# Patient Record
Sex: Female | Born: 1980 | Race: White | Hispanic: No | Marital: Married | Smoking: Never smoker
Health system: Southern US, Community
[De-identification: ages and names within clinical notes are randomized; demographics above are authoritative.]

## PROBLEM LIST (undated history)

## (undated) ENCOUNTER — Inpatient Hospital Stay (HOSPITAL_COMMUNITY): Payer: Self-pay

## (undated) DIAGNOSIS — M1A9XX1 Chronic gout, unspecified, with tophus (tophi): Secondary | ICD-10-CM

## (undated) DIAGNOSIS — E282 Polycystic ovarian syndrome: Secondary | ICD-10-CM

## (undated) DIAGNOSIS — I10 Essential (primary) hypertension: Secondary | ICD-10-CM

## (undated) DIAGNOSIS — E039 Hypothyroidism, unspecified: Secondary | ICD-10-CM

## (undated) DIAGNOSIS — M109 Gout, unspecified: Secondary | ICD-10-CM

## (undated) DIAGNOSIS — N200 Calculus of kidney: Secondary | ICD-10-CM

## (undated) DIAGNOSIS — O039 Complete or unspecified spontaneous abortion without complication: Secondary | ICD-10-CM

## (undated) DIAGNOSIS — M436 Torticollis: Secondary | ICD-10-CM

## (undated) DIAGNOSIS — R51 Headache: Secondary | ICD-10-CM

## (undated) HISTORY — DX: Torticollis: M43.6

## (undated) HISTORY — DX: Complete or unspecified spontaneous abortion without complication: O03.9

## (undated) HISTORY — DX: Chronic gout, unspecified, with tophus (tophi): M1A.9XX1

## (undated) HISTORY — DX: Gout, unspecified: M10.9

## (undated) HISTORY — PX: OTHER SURGICAL HISTORY: SHX169

## (undated) HISTORY — PX: LITHOTRIPSY: SUR834

---
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
## 2003-03-13 ENCOUNTER — Other Ambulatory Visit: Admission: RE | Admit: 2003-03-13 | Discharge: 2003-03-13 | Payer: Self-pay | Admitting: *Deleted

## 2003-03-31 ENCOUNTER — Encounter: Admission: RE | Admit: 2003-03-31 | Discharge: 2003-06-29 | Payer: Self-pay | Admitting: Family Medicine

## 2003-08-26 ENCOUNTER — Encounter: Admission: RE | Admit: 2003-08-26 | Discharge: 2003-11-24 | Payer: Self-pay | Admitting: Family Medicine

## 2003-12-02 ENCOUNTER — Encounter: Admission: RE | Admit: 2003-12-02 | Discharge: 2004-03-01 | Payer: Self-pay | Admitting: Family Medicine

## 2004-08-07 HISTORY — PX: WISDOM TOOTH EXTRACTION: SHX21

## 2011-05-30 ENCOUNTER — Ambulatory Visit: Payer: Self-pay | Admitting: Family Medicine

## 2011-06-02 LAB — BASIC METABOLIC PANEL
BUN: 11 mg/dL (ref 4–21)
Creatinine: 0.8 mg/dL (ref <=1.1)
Potassium: 4.7 mmol/L (ref 3.4–5.3)

## 2011-06-02 LAB — LIPID PANEL: Cholesterol: 243 mg/dL — AB (ref 0–200)

## 2011-06-02 LAB — TSH: TSH: 10.59 u[IU]/mL — AB (ref <=5.90)

## 2011-06-02 LAB — HEPATIC FUNCTION PANEL
AST: 26 U/L (ref 13–35)
Bilirubin, Total: 0.6 mg/dL

## 2011-06-02 LAB — CBC AND DIFFERENTIAL
Hemoglobin: 16.1 g/dL — AB (ref 12.0–16.0)
WBC: 9 10^3/mL

## 2011-06-08 ENCOUNTER — Encounter: Payer: Self-pay | Admitting: Family Medicine

## 2011-06-08 ENCOUNTER — Ambulatory Visit (INDEPENDENT_AMBULATORY_CARE_PROVIDER_SITE_OTHER): Payer: PRIVATE HEALTH INSURANCE | Admitting: Family Medicine

## 2011-06-08 DIAGNOSIS — R946 Abnormal results of thyroid function studies: Secondary | ICD-10-CM

## 2011-06-08 DIAGNOSIS — M109 Gout, unspecified: Secondary | ICD-10-CM

## 2011-06-08 DIAGNOSIS — IMO0002 Reserved for concepts with insufficient information to code with codable children: Secondary | ICD-10-CM

## 2011-06-08 DIAGNOSIS — Z23 Encounter for immunization: Secondary | ICD-10-CM

## 2011-06-08 MED ORDER — LEVOTHYROXINE SODIUM 50 MCG PO CAPS: 50.0000 ug | ORAL_CAPSULE | Freq: Every day | ORAL | Status: DC

## 2011-06-08 MED ORDER — BENAZEPRIL HCL 20 MG PO TABS: 20.0000 mg | ORAL_TABLET | Freq: Every day | ORAL | Status: DC

## 2011-06-08 MED ORDER — CEPHALEXIN 500 MG PO CAPS: 500.0000 mg | ORAL_CAPSULE | Freq: Three times a day (TID) | ORAL | Status: AC

## 2011-06-08 NOTE — Patient Instructions (Signed)
Eat a very low salt diet 1.5 Gram Low Sodium Diet A 1.5 gram sodium diet restricts the amount of sodium in the diet to no more than 1.5 g or 1500 mg daily. The American Heart Association recommends Americans over the age of 58 to consume no more than 1500 mg of sodium each day to reduce the risk of developing high blood pressure. Research also shows that limiting sodium may reduce heart attack and stroke risk. Many foods contain sodium for flavor and sometimes as a preservative. When the amount of sodium in a diet needs to be low, it is important to know what to look for when choosing foods and drinks. The following includes some information and guidelines to help make it easier for you to adapt to a low sodium diet. QUICK TIPS  Do not add salt to food.     Avoid convenience items and fast food.     Choose unsalted snack foods.     Buy lower sodium products, often labeled as "lower sodium" or "no salt added."     Check food labels to learn how much sodium is in 1 serving.     When eating at a restaurant, ask that your food be prepared with less salt or none, if possible.  READING FOOD LABELS FOR SODIUM INFORMATION The nutrition facts label is a good place to find how much sodium is in foods. Look for products with no more than 400 mg of sodium per serving. Remember that 1.5 g = 1500 mg. The food label may also list foods as:  Sodium-free: Less than 5 mg in a serving.     Very low sodium: 35 mg or less in a serving.     Low-sodium: 140 mg or less in a serving.     Light in sodium: 50% less sodium in a serving. For example, if a food that usually has 300 mg of sodium is changed to become light in sodium, it will have 150 mg of sodium.     Reduced sodium: 25% less sodium in a serving. For example, if a food that usually has 400 mg of sodium is changed to reduced sodium, it will have 300 mg of sodium.  CHOOSING FOODS Grains  Avoid: Salted crackers and snack items. Some cereals, including  instant hot cereals. Bread stuffing and biscuit mixes. Seasoned rice or pasta mixes.     Choose: Unsalted snack items. Low-sodium cereals, oats, puffed wheat and rice, shredded wheat. English muffins and bread. Pasta.  Meats  Avoid: Salted, canned, smoked, spiced, pickled meats, including fish and poultry. Bacon, ham, sausage, cold cuts, hot dogs, anchovies.     Choose: Low-sodium canned tuna and salmon. Fresh or frozen meat, poultry, and fish.  Dairy  Avoid: Processed cheese and spreads. Cottage cheese. Buttermilk and condensed milk. Regular cheese.     Choose: Milk. Low-sodium cottage cheese. Yogurt. Sour cream. Low-sodium cheese.  Fruits and Vegetables  Avoid: Regular canned vegetables. Regular canned tomato sauce and paste. Frozen vegetables in sauces. Olives. Rosita Fire. Relishes. Sauerkraut.     Choose: Low-sodium canned vegetables. Low-sodium tomato sauce and paste. Frozen or fresh vegetables. Fresh and frozen fruit.  Condiments  Avoid: Canned and packaged gravies. Worcestershire sauce. Tartar sauce. Barbecue sauce. Soy sauce. Steak sauce. Ketchup. Onion, garlic, and table salt. Meat flavorings and tenderizers.     Choose: Fresh and dried herbs and spices. Low-sodium varieties of mustard and ketchup. Lemon juice. Tabasco sauce. Horseradish.  SAMPLE 1.5 GRAM SODIUM MEAL PLAN Breakfast /  Sodium (mg)  1 cup low-fat milk / 143 mg     1 whole-wheat English muffin / 240 mg     1 tbs heart-healthy margarine / 153 mg     1 hard-boiled egg / 139 mg     1 small orange / 0 mg  Lunch / Sodium (mg)  1 cup raw carrots / 76 mg     2 tbs no salt added peanut butter / 5 mg     2 slices whole-wheat bread / 270 mg     1 tbs jelly / 6 mg      cup red grapes / 2 mg  Dinner / Sodium (mg)  1 cup whole-wheat pasta / 2 mg     1 cup low-sodium tomato sauce / 73 mg     3 oz lean ground beef / 57 mg     1 small side salad (1 cup raw spinach leaves,  cup cucumber,  cup yellow bell  pepper) with 1 tsp olive oil and 1 tsp red wine vinegar / 25 mg  Snack / Sodium (mg)  1 container low-fat vanilla yogurt / 107 mg     3 graham cracker squares / 127 mg  Nutrient Analysis  Calories: 1745     Protein: 75 g     Carbohydrate: 237 g     Fat: 57 g     Sodium: 1425 mg  Document Released: 07/24/2005 Document Revised: 04/05/2011 Document Reviewed: 10/25/2009 [facility] Patient Information 2012 [place], [place].

## 2011-06-08 NOTE — Progress Notes (Signed)
Subjective:    Patient ID: Brenda Sharp, female    DOB: 07-15-1981, 30 y.o.   MRN: [id number]  HPI  In the ED was having abdominal Pain. Was dx with kidney stones.  BP was also very high there. Ended up with a lithotrypsy but they wouldn't do it because of the high blood pressure.  Saw Dr. Zachery Dauer in [place] and started amlodipine 5mg  for 7 days.  Tolerating well. Had full blood workup. When they did this was told hypothyroid and was told to tell new MD to start mediction. No sig weight gain.  + hair loss over the last year.  TSH was 10.  C/O of a lot of fatigue.   Gout -she also has a history of gout. She currently follows with orthopedist. She is currently on prednisone that she uses as needed. She says she has tried several different anti-inflammatories including colchicine in the past and that they did not work for her. She also tried allopurinol because it made her flares worse if she discontinued it. She is not on anything prophylactically. She does have a family history of gout. She gets between 6-8 flares per year.  Left hand, middle finger-she says it has been red swollen and occasionally will notice some drainage or pus every couple of days. It is now caused the nail to look abnormal along the medial border. She would like me to look at today. She denies any significant pain or tenderness.  Review of Systems  Constitutional: Negative for fever, diaphoresis and unexpected weight change.  HENT: Negative for hearing loss, rhinorrhea and tinnitus.        Hay fever allergies.   Eyes: Negative for visual disturbance.  Respiratory: Positive for cough. Negative for wheezing.   Cardiovascular: Negative for chest pain and palpitations.  Gastrointestinal: Negative for nausea, vomiting, diarrhea and blood in stool.  Genitourinary: Negative for vaginal bleeding, vaginal discharge and difficulty urinating.  Musculoskeletal: Negative for myalgias and arthralgias.  Skin: Negative for rash.    Neurological: Positive for headaches.  Hematological: Negative for adenopathy. Does not bruise/bleed easily.  Psychiatric/Behavioral: Positive for dysphoric mood. Negative for sleep disturbance. The patient is not nervous/anxious.        BP 150/106  Pulse 79  Wt 258 lb (117.028 kg)    Allergies  Allergen Reactions  . Penicillins     History reviewed. No pertinent past medical history.  Past Surgical History  Procedure Date  . Tonsillectomy 1990  . Wisdom tooth extraction 2006    History   Social History  . Marital Status: Single    Spouse Name: N/A    Number of Children: 0  . 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      [employer]   Social History Main Topics  . Smoking status: Never Smoker   . Smokeless tobacco: Not on file  . Alcohol Use: No  . Drug Use: No  . Sexually Active: Not on file   Other Topics Concern  . Not on file   Social History Narrative   4 caffeine per day.  No regular exercise.  She lives with her father Dorene Sorrow    Family History  Problem Relation Age of Onset  . Lung cancer Paternal Grandfather     smoker  . Heart attack Paternal Grandmother   . Heart attack Paternal Grandfather   . Diabetes    . Heart attack Maternal Grandfather   . Hypertension Father  Ms. Doreene Adas does not currently have medications on file.  Objective:   Physical Exam  Constitutional: She is oriented to person, place, and time. She appears well-developed and well-nourished.       Obese.   HENT:  Head: Normocephalic and atraumatic.  Eyes: Conjunctivae are normal. Pupils are equal, round, and reactive to light.  Neck: Normal range of motion. Neck supple. No thyromegaly present.  Cardiovascular: Normal rate, regular rhythm and normal heart sounds.   Pulmonary/Chest: Effort normal and breath sounds normal.  Musculoskeletal: She exhibits no edema.  Lymphadenopathy:    She has no cervical adenopathy.  Neurological:  She is alert and oriented to person, place, and time.  Skin: Skin is warm and dry.  Psychiatric: She has a normal mood and affect. Her behavior is normal.   Left hand, middle finger-she does have some atrophic changes to the medial nail border. There is no significant erythema or induration of the tissue around the nail. I am unable to express any discharge or pus.       Assessment & Plan:  HTN-not well controlled on amlodipine by itself. I will add an ACE inhibitor to see if we can get better control. If she does well in the hospital in the amlodipine we can do the combination medication which is generic. I hesitate to increase her amlodipine because of increased potential for swelling on the 10 mg dose. Follow up in 3-4 weeks.  Hypothyroid - we will start her with Synthroid 50 mcg daily. Recheck in 6-8 weeks. Hopefully this will help with her weight, fatigue, and recent hair loss.  Gout-we had a long conversation about the need for prophylaxis to prevent damage to her joints over the long-term. I discussed the allopurinol and Uloric which are the 2 prophylactic medications will both exacerbate her symptoms initially for usually about that for 6 months. Typically add colchicine once a day to help keep this under control and at that point she can come off of the colchicine. At this point in time I would like your blood pressure and thyroid under control and then we can concentrate on her gout when I see her back at her followup. Certainly being on steroids frequently has other long-term consequences.  Possible resolving paronychia-I don't see anything on exam today that would be amenable to I&D. I will have the structures for Keflex to see if we can get this to improve. If drainage recurs then please call the office on Monday with the finger.

## 2011-06-12 ENCOUNTER — Encounter: Payer: Self-pay | Admitting: Family Medicine

## 2011-06-12 LAB — URIC ACID: Uric Acid: 11.7

## 2011-06-27 ENCOUNTER — Encounter: Payer: Self-pay | Admitting: Family Medicine

## 2011-06-27 LAB — URIC ACID: Uric Acid: 10.3

## 2011-06-28 ENCOUNTER — Ambulatory Visit: Payer: PRIVATE HEALTH INSURANCE | Admitting: Family Medicine

## 2011-07-03 ENCOUNTER — Encounter: Payer: Self-pay | Admitting: Family Medicine

## 2011-07-03 ENCOUNTER — Ambulatory Visit (INDEPENDENT_AMBULATORY_CARE_PROVIDER_SITE_OTHER): Payer: PRIVATE HEALTH INSURANCE | Admitting: Family Medicine

## 2011-07-03 ENCOUNTER — Other Ambulatory Visit: Payer: Self-pay | Admitting: *Deleted

## 2011-07-03 VITALS — BP 121/72 | HR 109 | Wt 256.0 lb

## 2011-07-03 DIAGNOSIS — E785 Hyperlipidemia, unspecified: Secondary | ICD-10-CM

## 2011-07-03 DIAGNOSIS — E039 Hypothyroidism, unspecified: Secondary | ICD-10-CM

## 2011-07-03 DIAGNOSIS — M109 Gout, unspecified: Secondary | ICD-10-CM

## 2011-07-03 DIAGNOSIS — I10 Essential (primary) hypertension: Secondary | ICD-10-CM

## 2011-07-03 MED ORDER — COLCHICINE 0.6 MG PO TABS: 0.6000 mg | ORAL_TABLET | Freq: Every day | ORAL | Status: DC

## 2011-07-03 MED ORDER — FEBUXOSTAT 40 MG PO TABS: 40.0000 mg | ORAL_TABLET | Freq: Every day | ORAL | Status: DC

## 2011-07-03 NOTE — Progress Notes (Signed)
  Subjective:    Patient ID: Brenda Sharp, female    DOB: May 09, 1981, 30 y.o.   MRN: [id number]  Hypertension This is a chronic problem. The current episode started more than 1 year ago. The problem is unchanged. The problem is controlled. Pertinent negatives include no chest pain or shortness of breath. There are no associated agents to hypertension. Past treatments include ACE inhibitors and calcium channel blockers. The current treatment provides significant improvement. Compliance problems include exercise.    Gout- She took allopurinol for over a year and unfortunately they were not able to significantly decrease her uric acid level. She has never tried Uloric. She currently uses prednisone for her flares. She said colchicine tends to upset her stomach.  Also here to follow up and discuss her lab work which was drawn by her previous PCP.  Hypothyroidism-she can try to take her thyroid medication more consistently than that she is still very inconsistent. She knows that for [place] to check a blood level she needs to be more consistent to see if the testing is accurate.   Review of Systems  Respiratory: Negative for shortness of breath.   Cardiovascular: Negative for chest pain.       Objective:   Physical Exam  Constitutional: She is oriented to person, place, and time. She appears well-developed and well-nourished.  HENT:  Head: Normocephalic and atraumatic.  Neck: Neck supple. No thyromegaly present.  Cardiovascular: Normal rate, regular rhythm and normal heart sounds.   Pulmonary/Chest: Effort normal and breath sounds normal.  Lymphadenopathy:    She has no cervical adenopathy.  Neurological: She is alert and oriented to person, place, and time.  Skin: Skin is warm and dry.  Psychiatric: Her behavior is normal.          Assessment & Plan:  Hypertension-well controlled on current regimen of amlodipine and benazepril. Continue current regimen. Followup in 6  months.  Gout-after reviewing her lab work from from her prior PCP her uric acid level was over 11. She took allopurinol for over a year and unfortunately they were not able to significantly decrease her uric acid level. She has never tried Uloric. She currently uses prednisone for her flares. She said colchicine tends to upset her stomach. We discussed a trial of Uloric. I would like to start with a 40 mg tablet for the first 2 months and then try to increase to 80 mg based on her starting level of uric acid. I also discussed the importance of taking a medication such as cortisone once a day for the first 6 months to help reduce the possibility for flares. I explained that hopefully with once a day dosing this should not upset her stomach as a typical repeat dosing for an acute flare. I discussed the importance of avoiding steroids as much as possible as this certainly is only going to cause weight gain. As the long-term effects. She is only 29 and really needs to get her gout under good control. I will see her back in 3 months. At that point time she will have been on a milligram dose for one month.  Hyperlipidemia-whenever her lab results. I would like her to really focus in on low fat diet and regular exercise and we will repeat her cholesterol in 3 months.  Hypothyroidism-she has been trying to be more consistent with her thyroid medication. I will give her a lab slip to recheck her TSH sometime this month.

## 2011-07-03 NOTE — Patient Instructions (Signed)
Work on diet and exercise for your cholesterol. Recheck in 3 mo.

## 2011-07-04 MED ORDER — FEBUXOSTAT 40 MG PO TABS: 40.0000 mg | ORAL_TABLET | Freq: Every day | ORAL | Status: DC

## 2011-07-04 MED ORDER — COLCHICINE 0.6 MG PO TABS: 0.6000 mg | ORAL_TABLET | Freq: Every day | ORAL | Status: DC

## 2011-07-04 NOTE — Telephone Encounter (Signed)
Addended by: Garrit Marrow P on: 07/04/2011 04:24 PM   Modules accepted: Orders  

## 2011-07-19 ENCOUNTER — Ambulatory Visit (INDEPENDENT_AMBULATORY_CARE_PROVIDER_SITE_OTHER): Payer: PRIVATE HEALTH INSURANCE | Admitting: Family Medicine

## 2011-07-19 ENCOUNTER — Encounter: Payer: Self-pay | Admitting: Family Medicine

## 2011-07-19 VITALS — BP 154/118 | HR 93 | Temp 98.2°F | Ht 63.0 in | Wt 255.0 lb

## 2011-07-19 DIAGNOSIS — I10 Essential (primary) hypertension: Secondary | ICD-10-CM

## 2011-07-19 DIAGNOSIS — E039 Hypothyroidism, unspecified: Secondary | ICD-10-CM

## 2011-07-19 DIAGNOSIS — J329 Chronic sinusitis, unspecified: Secondary | ICD-10-CM

## 2011-07-19 MED ORDER — BENAZEPRIL HCL 20 MG PO TABS: 20.0000 mg | ORAL_TABLET | Freq: Every day | ORAL | Status: DC

## 2011-07-19 MED ORDER — AZITHROMYCIN 250 MG PO TABS: ORAL_TABLET | ORAL | Status: AC

## 2011-07-19 MED ORDER — AMLODIPINE BESYLATE 5 MG PO TABS: 5.0000 mg | ORAL_TABLET | Freq: Every day | ORAL | Status: DC

## 2011-07-19 NOTE — Patient Instructions (Signed)
Stay hydrated Call if your not better in a week.

## 2011-07-19 NOTE — Progress Notes (Signed)
  Subjective:    Patient ID: Brenda Sharp, female    DOB: 05-16-1981, 30 y.o.   MRN: [id number]  HPI Cough x 2 weeks.  More dry. + nasal congestion for 2 weeks. No facil pain or pressure. No fever.  No GI sxs, maybe some nausea. Mild ST.  Left ear pain for about 2-3 days. Ears have been popping. Taking mucinex for 2 weeks. Not sure if helping.  Also using cough syrup. Not actively on prednisone.   HTN - elevated today fom cough syrup. No problems with the medication, no SE and takes them daily  Needs refills.   Hypothyroid - Felt better initially once we started her medication but says now feels tired again. Says she does take it about an hour before breakfast on an empty stomach and says takes it daily.   Review of Systems     Objective:   Physical Exam  Constitutional: She is oriented to person, place, and time. She appears well-developed and well-nourished.  HENT:  Head: Normocephalic and atraumatic.  Right Ear: External ear normal.  Left Ear: External ear normal.  Nose: Nose normal.  Mouth/Throat: Oropharynx is clear and moist.       TMs and canals are clear.   Eyes: Conjunctivae and EOM are normal. Pupils are equal, round, and reactive to light.  Neck: Neck supple. No thyromegaly present.  Cardiovascular: Normal rate, regular rhythm and normal heart sounds.   Pulmonary/Chest: Effort normal and breath sounds normal. She has no wheezes.  Lymphadenopathy:    She has no cervical adenopathy.  Neurological: She is alert and oriented to person, place, and time.  Skin: Skin is warm and dry.  Psychiatric: She has a normal mood and affect.          Assessment & Plan:  Sinusitis -Will tx with ABX.  Stay hydrated. Call if your not better in a week. Stop cough syrup since elevated her BP.   HTN- Elevated today. She is on cough med. Recheck n Feb at f/u. Check BMP today  Hypothyroid - Recheck TSh today to make sure back into normal range.   Due for f/u gout in Feb. Will recheck  her urinc acid at that time.

## 2011-07-20 ENCOUNTER — Other Ambulatory Visit: Payer: Self-pay | Admitting: Family Medicine

## 2011-07-20 LAB — BASIC METABOLIC PANEL
CO2: 26 mEq/L (ref 19–32)
Chloride: 104 mEq/L (ref 96–112)
Potassium: 4.6 mEq/L (ref 3.5–5.3)
Sodium: 140 mEq/L (ref 135–145)

## 2011-07-20 MED ORDER — LEVOTHYROXINE SODIUM 75 MCG PO CAPS: 75.0000 ug | ORAL_CAPSULE | ORAL | Status: DC

## 2011-10-03 ENCOUNTER — Encounter: Payer: Self-pay | Admitting: Physician Assistant

## 2011-10-03 ENCOUNTER — Ambulatory Visit (INDEPENDENT_AMBULATORY_CARE_PROVIDER_SITE_OTHER): Payer: PRIVATE HEALTH INSURANCE | Admitting: Physician Assistant

## 2011-10-03 ENCOUNTER — Ambulatory Visit: Payer: PRIVATE HEALTH INSURANCE | Admitting: Family Medicine

## 2011-10-03 VITALS — BP 180/121 | HR 97 | Temp 98.1°F | Ht 63.0 in | Wt 256.0 lb

## 2011-10-03 DIAGNOSIS — I10 Essential (primary) hypertension: Secondary | ICD-10-CM

## 2011-10-03 DIAGNOSIS — H6692 Otitis media, unspecified, left ear: Secondary | ICD-10-CM

## 2011-10-03 DIAGNOSIS — H6690 Otitis media, unspecified, unspecified ear: Secondary | ICD-10-CM

## 2011-10-03 MED ORDER — CEFDINIR 300 MG PO CAPS: 300.0000 mg | ORAL_CAPSULE | Freq: Two times a day (BID) | ORAL | Status: DC

## 2011-10-03 NOTE — Patient Instructions (Signed)
Start omnicef twice a day for 10 days. STOP all decongestants and do not anymore. For sore throat you can do salt water gargles.   Otitis Media, Adult A middle ear infection is an infection in the space behind the eardrum. The medical name for this is "otitis media." It may happen after a common cold. It is caused by a germ that starts growing in that space. You may feel swollen glands in your neck on the side of the ear infection. HOME CARE INSTRUCTIONS   Take your medicine as directed until it is gone, even if you feel better after the first few days.   Only take over-the-counter or prescription medicines for pain, discomfort, or fever as directed by your caregiver.   Occasional use of a nasal decongestant a couple times per day may help with discomfort and help the eustachian tube to drain better.  Follow up with your caregiver in 10 to 14 days or as directed, to be certain that the infection has cleared. Not keeping the appointment could result in a chronic or permanent injury, pain, hearing loss and disability. If there is any problem keeping the appointment, you must call back to this facility for assistance. SEEK IMMEDIATE MEDICAL CARE IF:   You are not getting better in 2 to 3 days.   You have pain that is not controlled with medication.   You feel worse instead of better.   You cannot use the medication as directed.   You develop swelling, redness or pain around the ear or stiffness in your neck.  MAKE SURE YOU:   Understand these instructions.   Will watch your condition.   Will get help right away if you are not doing well or get worse.  Document Released: 04/28/2004 Document Revised: 04/05/2011 Document Reviewed: 02/28/2008 [facility] Patient Information 2012 [place], [place].

## 2011-10-03 NOTE — Progress Notes (Signed)
  Subjective:    Patient ID: Brenda Sharp, female    DOB: [birth year], 31 y.o.   MRN: [id number]  HPI Patient presents with 2 weeks of a cough with no production. She has had sore throat off and on. Both of her ears have been hurting but left is worse than right. Denies fever, SOB, wheezing or chills. Taken Robtussin and Advil and they have not helped.   She reports her blood pressure is elevated every time she takes decongestants. She denies chest pains or palpitations.   Review of Systems     Objective:   Physical Exam  Constitutional: She is oriented to person, place, and time. She appears well-developed and well-nourished.  HENT:  Head: Normocephalic and atraumatic.  Right Ear: External ear normal.  Left Ear: External ear normal.  Nose: Nose normal.  Mouth/Throat: Oropharynx is clear and moist.       Right TM normal. Left TM is dull and erythematous with purulent blood in external canal noted. No perforation seen.  Eyes: Conjunctivae are normal.  Neck:       Bilateral anterior cervical adenopathy with tenderness to palpitations.   Cardiovascular: Normal rate, regular rhythm and normal heart sounds.   Pulmonary/Chest: Effort normal and breath sounds normal. She has no wheezes.  Lymphadenopathy:    She has cervical adenopathy.  Neurological: She is alert and oriented to person, place, and time.  Psychiatric: She has a normal mood and affect.          Assessment & Plan:  HTN- Patient reports that her blood pressure does this every time she takes decongestants. She has appointment with Dr. Linford Arnold at the beginning of march if still elevated informed patient that we will have to make some changes with medications. Patient was told not to take decongesants at all ever.  Left otitis media- Allergy to PCN with rash. Gave Omnicef. Patient is to stop medication and call office with any adverse reaction. Follow up if not improving in 1 week.

## 2011-10-11 ENCOUNTER — Ambulatory Visit (INDEPENDENT_AMBULATORY_CARE_PROVIDER_SITE_OTHER): Payer: PRIVATE HEALTH INSURANCE | Admitting: Family Medicine

## 2011-10-11 ENCOUNTER — Encounter: Payer: Self-pay | Admitting: Family Medicine

## 2011-10-11 VITALS — BP 134/98 | HR 93 | Ht 63.0 in | Wt 253.0 lb

## 2011-10-11 DIAGNOSIS — I10 Essential (primary) hypertension: Secondary | ICD-10-CM

## 2011-10-11 DIAGNOSIS — R7309 Other abnormal glucose: Secondary | ICD-10-CM

## 2011-10-11 DIAGNOSIS — E8881 Metabolic syndrome: Secondary | ICD-10-CM

## 2011-10-11 DIAGNOSIS — E669 Obesity, unspecified: Secondary | ICD-10-CM | POA: Insufficient documentation

## 2011-10-11 DIAGNOSIS — E1165 Type 2 diabetes mellitus with hyperglycemia: Secondary | ICD-10-CM | POA: Insufficient documentation

## 2011-10-11 DIAGNOSIS — M109 Gout, unspecified: Secondary | ICD-10-CM

## 2011-10-11 LAB — URIC ACID: Uric Acid, Serum: 9.3 mg/dL — ABNORMAL HIGH (ref 2.4–7.0)

## 2011-10-11 LAB — POCT GLYCOSYLATED HEMOGLOBIN (HGB A1C): Hemoglobin A1C: 6.2

## 2011-10-11 MED ORDER — FEBUXOSTAT 80 MG PO TABS: 80.0000 mg | ORAL_TABLET | Freq: Every day | ORAL | Status: DC

## 2011-10-11 MED ORDER — METOPROLOL SUCCINATE ER 25 MG PO TB24: 25.0000 mg | ORAL_TABLET | Freq: Every day | ORAL | Status: DC

## 2011-10-11 NOTE — Progress Notes (Signed)
  Subjective:    Patient ID: Charlyn Minerva, female    DOB: 12-20-1980, 31 y.o.   MRN: [id number]  HPI Gout - Still having to use her prednisone once a week to breakthrough pain with her gout. Taking her Uloric 40mg  daily and her colchicine daily.    HTN- Much improved. No CP or SOB.  No SE of BP meds.  No Aleve or IBU.   She also recently had an ear infection on the left. She said she completed her antibiotics last week but still feeling like the ear is stopped and occasional discomfort. No more fever. No clear drainage. To let me check it to make sure the infection has cleared up.  Also here to follow up abnormal glucose on her fasting labs. She denies any increased thirst or urination. No vision changes.  Review of Systems     Objective:   Physical Exam  Constitutional: She is oriented to person, place, and time. She appears well-developed and well-nourished.  HENT:  Head: Normocephalic and atraumatic.  Left Ear: External ear normal.  Neck: Neck supple. No thyromegaly present.  Cardiovascular: Normal rate, regular rhythm and normal heart sounds.   Pulmonary/Chest: Effort normal and breath sounds normal.  Musculoskeletal:       She is still very tender over the right great toe joint. Mildly erythematous.    Lymphadenopathy:    She has no cervical adenopathy.  Neurological: She is alert and oriented to person, place, and time.  Skin: Skin is warm and dry.  Psychiatric: She has a normal mood and affect. Her behavior is normal.          Assessment & Plan:  Gout-I will increase her Uloric 80 mg. I discussed with her that we really need to get her completely off the steroids. These can be very dangerous long-term and can certainly contribute to her obesity. She can continue the colchicine once a day. Followup in 3-4 months. We will also check a uric acid level today to make sure that she is at goal. Next  Hypertension-much better control today. Her diastolic is still not quite at  goal. I will add metoprolol 25 mg extended release once a day. I would hesitate on increasing her amlodipine to 10 to chair he has problems with swelling in her feet. Recheck blood pressure in 3-4 months.  Eustachian tube dysfunction-her ear infection appears to be completely cleared she still having problems with pressure and is unable to pop the eardrum. We discussed using pressure to try and get it to pop and also adding a nasal steroid spray. I gave her a sample of Zetonna to try for 2 weeks to see if this helps. If she starts running fever or feels her symptoms are getting worse and has come back and have the ear reexamined.  Insulin resistance-we discussed her new diagnosis. We reviewed to decrease her concentrated sweets as well as looking at her portion sizes on her hydrate intake. We also discussed the importance of getting off of the prednisone which increases blood sugars. Also encouraged regular exercise and weight loss. I gave her a handout and reviewed with her plate should look like when she sits down to eat lunch and dinner. It also had some conversions and portion sizes for carbohydrates. Followup in 3-4 months.-

## 2011-10-11 NOTE — Patient Instructions (Signed)
Zetonna- 1 spray in each nostril daily. Let me know if ear is not better in 2 weeks.

## 2011-10-12 ENCOUNTER — Other Ambulatory Visit: Payer: Self-pay | Admitting: *Deleted

## 2011-10-12 MED ORDER — COLCHICINE 0.6 MG PO TABS: 0.6000 mg | ORAL_TABLET | Freq: Every day | ORAL | Status: DC

## 2011-10-17 ENCOUNTER — Other Ambulatory Visit: Payer: Self-pay | Admitting: Family Medicine

## 2011-10-20 ENCOUNTER — Ambulatory Visit (INDEPENDENT_AMBULATORY_CARE_PROVIDER_SITE_OTHER): Payer: PRIVATE HEALTH INSURANCE | Admitting: Family Medicine

## 2011-10-20 ENCOUNTER — Encounter: Payer: Self-pay | Admitting: Family Medicine

## 2011-10-20 VITALS — BP 131/84 | HR 69 | Temp 98.4°F | Ht 63.0 in | Wt 253.0 lb

## 2011-10-20 DIAGNOSIS — J40 Bronchitis, not specified as acute or chronic: Secondary | ICD-10-CM

## 2011-10-20 DIAGNOSIS — J029 Acute pharyngitis, unspecified: Secondary | ICD-10-CM

## 2011-10-20 MED ORDER — HYDROCODONE-HOMATROPINE 5-1.5 MG/5ML PO SYRP: 5.0000 mL | ORAL_SOLUTION | Freq: Every evening | ORAL | Status: AC | PRN

## 2011-10-20 MED ORDER — AZITHROMYCIN 250 MG PO TABS: ORAL_TABLET | ORAL | Status: AC

## 2011-10-20 NOTE — Progress Notes (Signed)
  Subjective:    Patient ID: Brenda Sharp, female    DOB: November 20, 1980, 31 y.o.   MRN: [id number]  HPI Cough and ST x 1 week. Has had ear pain for several weeks. Started the nasal steroid a bout 10 days ago and says her ears are better but getting a burnin sensation on the right side of the throat. Not a lot of post nasal drip. No wheezing.  Occ SOB with the cough.   Review of Systems     Objective:   Physical Exam  Constitutional: She is oriented to person, place, and time. She appears well-developed and well-nourished.  HENT:  Head: Normocephalic and atraumatic.  Right Ear: External ear normal.  Left Ear: External ear normal.  Nose: Nose normal.  Mouth/Throat: Oropharynx is clear and moist.       TMs and canals are clear.  Left TM looks dull. Some dried blood in the canal.   Eyes: Conjunctivae and EOM are normal. Pupils are equal, round, and reactive to light.  Neck: Neck supple. No thyromegaly present.  Cardiovascular: Normal rate, regular rhythm and normal heart sounds.   Pulmonary/Chest: Effort normal and breath sounds normal. She has no wheezes.  Lymphadenopathy:    She has no cervical adenopathy.  Neurological: She is alert and oriented to person, place, and time.  Skin: Skin is warm and dry.  Psychiatric: She has a normal mood and affect.          Assessment & Plan:  Bronchitis- Trial of ABX, tought still could be viral but has had throat sxs for several weeks.  Also consider cough may be ACEi related. Symptomatic care. Rapid strep is neg.

## 2011-10-20 NOTE — Patient Instructions (Addendum)

## 2011-10-26 ENCOUNTER — Ambulatory Visit (INDEPENDENT_AMBULATORY_CARE_PROVIDER_SITE_OTHER): Payer: PRIVATE HEALTH INSURANCE | Admitting: Family Medicine

## 2011-10-26 ENCOUNTER — Encounter: Payer: Self-pay | Admitting: Family Medicine

## 2011-10-26 VITALS — BP 142/96 | HR 80 | Ht 63.0 in | Wt 256.0 lb

## 2011-10-26 DIAGNOSIS — I10 Essential (primary) hypertension: Secondary | ICD-10-CM

## 2011-10-26 DIAGNOSIS — E039 Hypothyroidism, unspecified: Secondary | ICD-10-CM

## 2011-10-26 DIAGNOSIS — E785 Hyperlipidemia, unspecified: Secondary | ICD-10-CM

## 2011-10-26 DIAGNOSIS — R7309 Other abnormal glucose: Secondary | ICD-10-CM

## 2011-10-26 MED ORDER — LEVOTHYROXINE SODIUM 75 MCG PO CAPS: 75.0000 ug | ORAL_CAPSULE | ORAL | Status: DC

## 2011-10-26 MED ORDER — BENAZEPRIL HCL 40 MG PO TABS: 40.0000 mg | ORAL_TABLET | Freq: Every day | ORAL | Status: DC

## 2011-10-26 NOTE — Progress Notes (Signed)
  Subjective:    Patient ID: Brenda Sharp, female    DOB: 1981-06-27, 31 y.o.   MRN: [id number]  HPI  HTN - has been feeling really dizzy and tired (hard to get out of bed) since starting the Toprol 25 XL. No CP or SOB. Taking her meds regularly.   Hypothyroid - She denies any problems except for some recent fatigue. TSH in October was 10 and we send a new rx for synthroid to 75mg .She evidently never got the new rx.   Review of Systems     Objective:   Physical Exam  Constitutional: She is oriented to person, place, and time. She appears well-developed and well-nourished.  HENT:  Head: Normocephalic and atraumatic.  Cardiovascular: Normal rate, regular rhythm and normal heart sounds.   Pulmonary/Chest: Effort normal and breath sounds normal.  Musculoskeletal: She exhibits no edema.  Neurological: She is alert and oriented to person, place, and time.  Skin: Skin is warm and dry.  Psychiatric: She has a normal mood and affect. Her behavior is normal.          Assessment & Plan:  HTN- will hold metoprolol and inc benazepril to 40mg . Continue amlodipine. If not well controlled and feels better off the metoprolol then consider adding HCTZ.  F/U in 3-4 weeks.   Hypothyroidism-will send a prescription for 75 mcg of Levothroid. After couple weeks she can go to lab and recheck her level. She was given a lab slip today.  Hyperlipidemia-she really wanted to work on diet and exercise and then recheck or levels. They're still elevated then consider starting a statin at that time.

## 2011-11-22 ENCOUNTER — Ambulatory Visit: Payer: PRIVATE HEALTH INSURANCE | Admitting: Family Medicine

## 2011-12-01 ENCOUNTER — Ambulatory Visit (INDEPENDENT_AMBULATORY_CARE_PROVIDER_SITE_OTHER): Payer: PRIVATE HEALTH INSURANCE | Admitting: Family Medicine

## 2011-12-01 ENCOUNTER — Encounter: Payer: Self-pay | Admitting: Family Medicine

## 2011-12-01 VITALS — BP 130/88 | HR 70 | Ht 63.0 in | Wt 256.0 lb

## 2011-12-01 DIAGNOSIS — I10 Essential (primary) hypertension: Secondary | ICD-10-CM

## 2011-12-01 DIAGNOSIS — R7301 Impaired fasting glucose: Secondary | ICD-10-CM

## 2011-12-01 DIAGNOSIS — R42 Dizziness and giddiness: Secondary | ICD-10-CM

## 2011-12-01 DIAGNOSIS — E785 Hyperlipidemia, unspecified: Secondary | ICD-10-CM | POA: Insufficient documentation

## 2011-12-01 MED ORDER — NEBIVOLOL HCL 5 MG PO TABS: 5.0000 mg | ORAL_TABLET | Freq: Every day | ORAL | Status: DC

## 2011-12-01 NOTE — Progress Notes (Signed)
  Subjective:    Patient ID: Brenda Sharp, female    DOB: 12-28-1980, 31 y.o.   MRN: [id number]  HPI Dizzy when wakes up in AM and when lays down at night. Startede after starting the metoprolol 25mg .  No CP or SOB.  No NSAIDs. Eating a low salt diet. She says she is trying to eat very healthy. Otherwise she takes all of her blood pressure pills at night this is very consistent with her medications. She has not been taking any anti-inflammatories.     Review of Systems     Objective:   Physical Exam  Constitutional: She is oriented to person, place, and time. She appears well-developed and well-nourished.  HENT:  Head: Normocephalic and atraumatic.  Cardiovascular: Normal rate, regular rhythm and normal heart sounds.   Pulmonary/Chest: Effort normal and breath sounds normal.  Neurological: She is alert and oriented to person, place, and time.  Skin: Skin is warm and dry.  Psychiatric: She has a normal mood and affect. Her behavior is normal.          Assessment & Plan:  HTN-Controlled but dizzy since started the metoprolol. I gave her samples of the skull at 5 mg to try in place of the metoprolol. I also asked her to move her ACE inhibitor the morning that way she can separate out some the blood pressure pills so they don't peak at the same time. Would like to see if this helped control her dizziness in the next couple weeks. She can give me a call. If it works well then we will send a prescription to her pharmacy. I did give her a coupon for a free 30 day supply.  IFG - Check A1C at f/u in 3 months.  She is doing well and trying to watch what she eats.   Hyper;lipidemia - due to recheck level. Uncontrolled. Given labslip today.

## 2011-12-01 NOTE — Patient Instructions (Signed)
Please call me in 2-3 weeks and let me know if Bystolic is working without side effects. If so we can call in a prescription.  Move benazepril to AM and take the amlodipine and Bystolic at night.

## 2011-12-05 LAB — LIPID PANEL: LDL Cholesterol: 125 mg/dL — ABNORMAL HIGH (ref 0–99)

## 2011-12-25 ENCOUNTER — Ambulatory Visit (INDEPENDENT_AMBULATORY_CARE_PROVIDER_SITE_OTHER): Payer: PRIVATE HEALTH INSURANCE | Admitting: Family Medicine

## 2011-12-25 ENCOUNTER — Encounter: Payer: Self-pay | Admitting: Family Medicine

## 2011-12-25 VITALS — BP 112/80 | HR 101 | Temp 98.3°F | Ht 63.0 in | Wt 250.0 lb

## 2011-12-25 DIAGNOSIS — R110 Nausea: Secondary | ICD-10-CM

## 2011-12-25 DIAGNOSIS — R509 Fever, unspecified: Secondary | ICD-10-CM

## 2011-12-25 DIAGNOSIS — E860 Dehydration: Secondary | ICD-10-CM

## 2011-12-25 NOTE — Progress Notes (Signed)
  Subjective:    Patient ID: Brenda Sharp, female    DOB: 26-Jul-1981, 31 y.o.   MRN: [id number]  HPI Started with dull HA 5 days ago. Dec appetite.  Next day felt nauseated, then blacked out and fell over the kitchen sink. Says took a few seconds.  Thought maybe it was a migraine that started. Says has had constant HA in her posterior neck and bilateral forehead. Has had dec appetite.   Not eating Feels really nauseated.  No vomiting.  No diarrhea.  Friday night it was 103.  Then Sat night had chills and had 101 temp.  Took Advill and that helped.  No other runny nose or cough.  Maybe some phlegm in the throat.  No fever last night. No known sick contacts.  No CP or palps when passed no urinary sxs.     Review of Systems     Objective:   Physical Exam  Constitutional: She is oriented to person, place, and time. She appears well-developed and well-nourished.  HENT:  Head: Normocephalic and atraumatic.  Right Ear: External ear normal.  Left Ear: External ear normal.  Nose: Nose normal.  Mouth/Throat: Oropharynx is clear and moist.       TMs and canals are clear.   Eyes: Conjunctivae and EOM are normal. Pupils are equal, round, and reactive to light.  Neck: Neck supple. No thyromegaly present.  Cardiovascular: Normal rate, regular rhythm and normal heart sounds.   Pulmonary/Chest: Effort normal and breath sounds normal. She has no wheezes.  Abdominal: Soft. Bowel sounds are normal. She exhibits no distension and no mass. There is no tenderness. There is no rebound and no guarding.  Lymphadenopathy:    She has no cervical adenopathy.  Neurological: She is alert and oriented to person, place, and time.  Skin: Skin is warm and dry.  Psychiatric: She has a normal mood and affect.          Assessment & Plan:  LIkely viral illness - will check CBC.  Fever has defervesced.  She appears to be dehydrated on exam today and encouraged her to push fluids aggressively. Given handout on  bronchitis well. She can advance as tolerated. Also check a CMP to make sure that her electrolytes are not abnormal.  Syncope- Likley hypoglycemic episode from not eating all day.  May be the cause of the HA.  She has not had any more syncopal episodes.

## 2011-12-25 NOTE — Patient Instructions (Signed)
Call me if have another episodes of syncope or get chest pain or heart racings.   B.R.A.T. Diet Your doctor has recommended the B.R.A.T. diet for you or your child until the condition improves. This is often used to help control diarrhea and vomiting symptoms. If you or your child can tolerate clear liquids, you may have:  Bananas.   Rice.   Applesauce.   Toast (and other simple starches such as crackers, potatoes, noodles).  Be sure to avoid dairy products, meats, and fatty foods until symptoms are better. Fruit juices such as apple, grape, and prune juice can make diarrhea worse. Avoid these. Continue this diet for 2 days or as instructed by your caregiver. Document Released: 07/24/2005 Document Revised: 07/13/2011 Document Reviewed: 01/10/2007 [facility] Patient Information 2012 [place], [place].

## 2011-12-26 ENCOUNTER — Other Ambulatory Visit: Payer: Self-pay | Admitting: Family Medicine

## 2011-12-26 DIAGNOSIS — R748 Abnormal levels of other serum enzymes: Secondary | ICD-10-CM

## 2011-12-26 DIAGNOSIS — R110 Nausea: Secondary | ICD-10-CM

## 2011-12-26 LAB — COMPLETE METABOLIC PANEL WITH GFR
Albumin: 4 g/dL (ref 3.5–5.2)
Alkaline Phosphatase: 141 U/L — ABNORMAL HIGH (ref 39–117)
CO2: 27 mEq/L (ref 19–32)
Chloride: 99 mEq/L (ref 96–112)
GFR, Est Non African American: 85 mL/min
Glucose, Bld: 105 mg/dL — ABNORMAL HIGH (ref 70–99)
Potassium: 4.3 mEq/L (ref 3.5–5.3)
Sodium: 136 mEq/L (ref 135–145)
Total Protein: 6.6 g/dL (ref 6.0–8.3)

## 2011-12-26 LAB — CBC WITH DIFFERENTIAL/PLATELET
Basophils Absolute: 0.2 10*3/uL — ABNORMAL HIGH (ref 0.0–0.1)
HCT: 45.5 % (ref 36.0–46.0)
Hemoglobin: 15.7 g/dL — ABNORMAL HIGH (ref 12.0–15.0)
Lymphocytes Relative: 39 % (ref 12–46)
Lymphs Abs: 2.1 10*3/uL (ref 0.7–4.0)
Monocytes Absolute: 0.4 10*3/uL (ref 0.1–1.0)
Monocytes Relative: 8 % (ref 3–12)
Neutro Abs: 2.8 10*3/uL (ref 1.7–7.7)
RBC: 4.99 MIL/uL (ref 3.87–5.11)
WBC: 5.5 10*3/uL (ref 4.0–10.5)

## 2011-12-27 ENCOUNTER — Telehealth: Payer: Self-pay | Admitting: *Deleted

## 2011-12-27 NOTE — Telephone Encounter (Signed)
Sue Lush please call pt and let her know I want to get an [place] because her liver enzymes are high. I want to look at her liver.

## 2011-12-27 NOTE — Telephone Encounter (Signed)
Pt doesn't know why she is being scheduled for it.

## 2011-12-27 NOTE — Telephone Encounter (Signed)
Pt notified and is aware.

## 2011-12-27 NOTE — Telephone Encounter (Signed)
Pt states GI called to schedule an appt for an [place]. Pt states that she wasn't aware that she needed a scan. Please advise if pt needs scan.

## 2011-12-27 NOTE — Telephone Encounter (Signed)
I think patient was notified of results after [facility] call her about the [place].  Please call her back and see if she is ok with scheduling this.

## 2011-12-28 ENCOUNTER — Ambulatory Visit
Admission: RE | Admit: 2011-12-28 | Discharge: 2011-12-28 | Disposition: A | Discharge status: Home / Self Care | Payer: PRIVATE HEALTH INSURANCE | Source: Ambulatory Visit | Attending: Family Medicine | Admitting: Family Medicine

## 2011-12-28 DIAGNOSIS — R110 Nausea: Secondary | ICD-10-CM

## 2011-12-28 DIAGNOSIS — R748 Abnormal levels of other serum enzymes: Secondary | ICD-10-CM

## 2012-01-05 ENCOUNTER — Ambulatory Visit: Payer: PRIVATE HEALTH INSURANCE | Admitting: Family Medicine

## 2012-01-08 ENCOUNTER — Ambulatory Visit (INDEPENDENT_AMBULATORY_CARE_PROVIDER_SITE_OTHER): Payer: PRIVATE HEALTH INSURANCE | Admitting: Family Medicine

## 2012-01-08 ENCOUNTER — Encounter: Payer: Self-pay | Admitting: Family Medicine

## 2012-01-08 VITALS — BP 134/87 | HR 104 | Ht 63.0 in | Wt 253.0 lb

## 2012-01-08 DIAGNOSIS — M109 Gout, unspecified: Secondary | ICD-10-CM

## 2012-01-08 DIAGNOSIS — G43109 Migraine with aura, not intractable, without status migrainosus: Secondary | ICD-10-CM

## 2012-01-08 DIAGNOSIS — I10 Essential (primary) hypertension: Secondary | ICD-10-CM

## 2012-01-08 MED ORDER — LEVOTHYROXINE SODIUM 75 MCG PO CAPS: 75.0000 ug | ORAL_CAPSULE | ORAL | Status: DC

## 2012-01-08 MED ORDER — NEBIVOLOL HCL 5 MG PO TABS: 5.0000 mg | ORAL_TABLET | Freq: Every day | ORAL | Status: DC

## 2012-01-08 MED ORDER — FEBUXOSTAT 80 MG PO TABS: 80.0000 mg | ORAL_TABLET | Freq: Every day | ORAL | Status: DC

## 2012-01-08 MED ORDER — AMLODIPINE BESYLATE 5 MG PO TABS: 5.0000 mg | ORAL_TABLET | Freq: Every day | ORAL | Status: DC

## 2012-01-08 MED ORDER — DULOXETINE HCL 60 MG PO CPEP: 60.0000 mg | ORAL_CAPSULE | Freq: Every day | ORAL | Status: DC

## 2012-01-08 MED ORDER — BENAZEPRIL HCL 40 MG PO TABS: 40.0000 mg | ORAL_TABLET | Freq: Every day | ORAL | Status: DC

## 2012-01-08 NOTE — Progress Notes (Signed)
  Subjective:    Patient ID: Brenda Sharp, female    DOB: [birth year], 31 y.o.   MRN: [id number]  HPI HTN  - Stopped the metoprolol and she is better on the amolodipine.  Seperated meds now. No CP or SOB or dizziness. No side effects with the addition of amlodipine. She's also doing well on the Bystolic.  Migraine today - Has tried topamax, imitrex, etc, betablocker, zomig, Gets severe HA about every 3 months. Sometimes will get dizzy with almost headache feeling in between.  She is interested in seeing a neurologist again. She is to get a headache along the center years ago.  Review of Systems     Objective:   Physical Exam  Constitutional: She is oriented to person, place, and time. She appears well-developed and well-nourished.  HENT:  Head: Normocephalic and atraumatic.  Cardiovascular: Normal rate, regular rhythm and normal heart sounds.   Pulmonary/Chest: Effort normal and breath sounds normal.  Neurological: She is alert and oriented to person, place, and time.  Skin: Skin is warm and dry.  Psychiatric: She has a normal mood and affect. Her behavior is normal.          Assessment & Plan:  HTN - Well controlled. F/U in 6 months.  Continue current regimen. Refill sent to the pharmacy. Check BMP at followup in 6 months.  Migraine - Will refer to neurology for further evaluation. She's tried multiple medications in tryptans in the past. I was hoping to Bystolic would actually help improve her headaches.  Gout-her gout is very well controlled. She has not had any flare since we increased her dose to 80 mg.

## 2012-01-23 ENCOUNTER — Ambulatory Visit: Payer: PRIVATE HEALTH INSURANCE | Admitting: Family Medicine

## 2012-02-06 ENCOUNTER — Telehealth: Payer: Self-pay | Admitting: *Deleted

## 2012-02-06 MED ORDER — PREDNISONE 20 MG PO TABS: 40.0000 mg | ORAL_TABLET | Freq: Every day | ORAL | Status: AC

## 2012-02-06 NOTE — Telephone Encounter (Signed)
OK, sent 5 day course.

## 2012-02-06 NOTE — Telephone Encounter (Signed)
Pt has called wanting to know if you can call in some Prednisone. States she has been having problems with her Gout x1 week and has been taking her Colchincine. Please advise.

## 2012-02-06 NOTE — Telephone Encounter (Signed)
Left message on vm

## 2012-02-23 ENCOUNTER — Ambulatory Visit: Payer: PRIVATE HEALTH INSURANCE | Admitting: Family Medicine

## 2012-07-22 ENCOUNTER — Encounter: Payer: Self-pay | Admitting: Family Medicine

## 2012-07-22 ENCOUNTER — Ambulatory Visit (INDEPENDENT_AMBULATORY_CARE_PROVIDER_SITE_OTHER): Payer: PRIVATE HEALTH INSURANCE | Admitting: Family Medicine

## 2012-07-22 VITALS — BP 136/91 | HR 90 | Ht 63.0 in | Wt 251.0 lb

## 2012-07-22 DIAGNOSIS — L658 Other specified nonscarring hair loss: Secondary | ICD-10-CM

## 2012-07-22 DIAGNOSIS — M109 Gout, unspecified: Secondary | ICD-10-CM

## 2012-07-22 DIAGNOSIS — E785 Hyperlipidemia, unspecified: Secondary | ICD-10-CM

## 2012-07-22 DIAGNOSIS — E039 Hypothyroidism, unspecified: Secondary | ICD-10-CM

## 2012-07-22 DIAGNOSIS — I10 Essential (primary) hypertension: Secondary | ICD-10-CM

## 2012-07-22 DIAGNOSIS — G43109 Migraine with aura, not intractable, without status migrainosus: Secondary | ICD-10-CM

## 2012-07-22 DIAGNOSIS — L659 Nonscarring hair loss, unspecified: Secondary | ICD-10-CM

## 2012-07-22 MED ORDER — NEBIVOLOL HCL 5 MG PO TABS: 5.0000 mg | ORAL_TABLET | Freq: Every day | ORAL | Status: DC

## 2012-07-22 MED ORDER — FEBUXOSTAT 80 MG PO TABS: 80.0000 mg | ORAL_TABLET | Freq: Every day | ORAL | Status: DC

## 2012-07-22 MED ORDER — DULOXETINE HCL 30 MG PO CPEP: 30.0000 mg | ORAL_CAPSULE | Freq: Every day | ORAL | Status: DC

## 2012-07-22 MED ORDER — COLCHICINE 0.6 MG PO TABS: 0.6000 mg | ORAL_TABLET | Freq: Every day | ORAL | Status: DC

## 2012-07-22 MED ORDER — LEVOTHYROXINE SODIUM 75 MCG PO CAPS: 75.0000 ug | ORAL_CAPSULE | ORAL | Status: DC

## 2012-07-22 MED ORDER — BENAZEPRIL HCL 40 MG PO TABS: 40.0000 mg | ORAL_TABLET | Freq: Every day | ORAL | Status: DC

## 2012-07-22 MED ORDER — AMLODIPINE BESYLATE 5 MG PO TABS: 5.0000 mg | ORAL_TABLET | Freq: Every day | ORAL | Status: DC

## 2012-07-22 NOTE — Progress Notes (Signed)
  Subjective:    Patient ID: Brenda Sharp, female    DOB: [birth year], 31 y.o.   MRN: [id number]  HPI HTN -  Pt denies chest pain, SOB, dizziness, or heart palpitations.  Taking meds as directed w/o problems.  Denies medication side effects. BP has been DBP 100 at home the last few times checked it.  No change in salt in her diet.  Was on decongestant last week.  Using Advil over the weekend.  Has lost a couple of pounds.  She is on Norvasc, systolic, and benazepril.   Gout-overall she's doing well. She's not been able to completely stop her colchicine. She says she takes about every 2-3 days. She's been taking her Uloric regularly without side effects or problems.   Migraines-her headaches are doing very well overall. She says she was originally put on Cymbalta for this and has worked well. She does notice if she misses today she starts to feel weird and gets headache. She says she would like to wean to something that is less expensive overall.  She has met someone and is engaged. She says be married in March and says they want to try to start to get pregnant soon as they get married.   Review of Systems     Objective:   Physical Exam  Constitutional: She is oriented to person, place, and time. She appears well-developed and well-nourished.  HENT:  Head: Normocephalic and atraumatic.  Cardiovascular: Normal rate, regular rhythm and normal heart sounds.   Pulmonary/Chest: Effort normal and breath sounds normal.  Neurological: She is alert and oriented to person, place, and time.  Skin: Skin is warm and dry.  Psychiatric: She has a normal mood and affect. Her behavior is normal.          Assessment & Plan:  HTN- Uncontrolled.  Diastolic pressure is elevated today. She says most of her home blood pressures have been good except last couple days. Reminded her about a strict low salt diet. Also she took NSAIDs over the weekend so this could be contributing. We'll also recheck her  thyroid to make sure that it's well-controlled. Recheck blood pressure in 2 months. If not well controlled then we can adjust her medication at the time.  Gout - continue with Uloric. We'll check a uric acid level. Also continue with colchicine every 2-3 days it is helping to control her symptoms.  Migraines-well-controlled. We will wean her Cymbalta down to 30 mg. After that we can decrease to 15 mg in one month. Followup in 2 months.  If she's planning on pregnancy we'll need to make some major changes to her medications especially her blood pressure medications. I did warn that there is an increased risk of birth defects on several medications she is taking. Thus we can schedule followup in February and we'll have to make some changes a month ahead of time before she tries to start getting pregnant. We can also start her prenatal vitamin at that time. We can also get her Pap smear up-to-date.

## 2012-07-30 LAB — TSH: TSH: 2.553 u[IU]/mL (ref 0.350–4.500)

## 2012-07-30 LAB — COMPLETE METABOLIC PANEL WITH GFR
ALT: 19 U/L (ref 0–35)
Albumin: 4.4 g/dL (ref 3.5–5.2)
CO2: 27 mEq/L (ref 19–32)
Calcium: 9.6 mg/dL (ref 8.4–10.5)
Chloride: 104 mEq/L (ref 96–112)
GFR, Est African American: >89 mL/min
GFR, Est Non African American: >89 mL/min
Glucose, Bld: 129 mg/dL — ABNORMAL HIGH (ref 70–99)
Potassium: 4.7 mEq/L (ref 3.5–5.3)
Sodium: 142 mEq/L (ref 135–145)
Total Protein: 6.8 g/dL (ref 6.0–8.3)

## 2012-07-30 LAB — URIC ACID: Uric Acid, Serum: 9.9 mg/dL — ABNORMAL HIGH (ref 2.4–7.0)

## 2012-07-30 LAB — LIPID PANEL: LDL Cholesterol: 137 mg/dL — ABNORMAL HIGH (ref 0–99)

## 2012-08-05 ENCOUNTER — Other Ambulatory Visit: Payer: Self-pay | Admitting: Family Medicine

## 2012-08-05 DIAGNOSIS — M109 Gout, unspecified: Secondary | ICD-10-CM

## 2012-09-09 ENCOUNTER — Ambulatory Visit (INDEPENDENT_AMBULATORY_CARE_PROVIDER_SITE_OTHER): Payer: BC Managed Care – PPO | Admitting: Physician Assistant

## 2012-09-09 ENCOUNTER — Encounter: Payer: Self-pay | Admitting: Physician Assistant

## 2012-09-09 VITALS — BP 162/105 | HR 93 | Wt 251.0 lb

## 2012-09-09 DIAGNOSIS — H6980 Other specified disorders of Eustachian tube, unspecified ear: Secondary | ICD-10-CM

## 2012-09-09 DIAGNOSIS — H9202 Otalgia, left ear: Secondary | ICD-10-CM

## 2012-09-09 DIAGNOSIS — H9209 Otalgia, unspecified ear: Secondary | ICD-10-CM

## 2012-09-09 MED ORDER — MOMETASONE FUROATE 50 MCG/ACT NA SUSP: 2.0000 | Freq: Every day | NASAL | Status: DC

## 2012-09-09 NOTE — Patient Instructions (Addendum)
Start nasonex and zyrtec/claritin daily. Stay off all decongestants. REcheck and consider steroid if not improving.

## 2012-09-09 NOTE — Progress Notes (Signed)
  Subjective:    Patient ID: Brenda Sharp, female    DOB: Oct 31, 1980, 32 y.o.   MRN: [id number]  HPI Patient is a 31 yo female who presents to the clinic with 2 weeks of Upper respiratory infection that has gotten better but her left ear has continued to hurt. She has had history of left ear infections over past year. 5 days ago her left ear pain started to worsen and extended down the left side of her neck. Her neck hurts so bad she cannot turn her head to the left. Today for ear pain and neck pain is much better but feels like her ear is about to pop. She has been taking Robitussin around-the-clock. Her blood pressure is extremely elevated today. She denies any chest pains, palpitations, shortness of breath, cough or headaches. Patient denies any fever, chills, muscle aches, sinus pressure, sore throat.    Review of Systems     Objective:   Physical Exam  Constitutional: She appears well-developed and well-nourished.  HENT:  Head: Normocephalic and atraumatic.  Right Ear: External ear normal.  Left Ear: External ear normal.  Mouth/Throat: Oropharynx is clear and moist. No oropharyngeal exudate.       Left TM is bulging with a slight light reflex. NO blood or pus. Erythema surrounding TM.  Right TM clear.  Nasal turbinates red and swollen.   Negative for sinus pressure.  Eyes: Conjunctivae normal are normal.  Neck: Normal range of motion. Neck supple.  Cardiovascular: Normal rate, regular rhythm and normal heart sounds.   Pulmonary/Chest: Effort normal and breath sounds normal. She has no wheezes.  Lymphadenopathy:    She has no cervical adenopathy.  Skin: Skin is warm and dry.  Psychiatric: She has a normal mood and affect. Her behavior is normal.          Assessment & Plan:  Left ear pain/eustachian tube dysfunction/HTN-since TM was bulging so much I suspect some type of eustachian tube dysfunction. Prescribed Nasonex to start daily and to get Zyrtec or Claritin  over-the-counter to start daily. I would love to prescribe a steroid to help with inflammation however blood pressure is too elevated today. Discussed with patient to come back to the office sometime this week for blood pressure recheck and if symptoms not improving and blood pressure decreased could consider a steroid.Pt is very aware to STOP all decongestants.

## 2012-09-11 ENCOUNTER — Ambulatory Visit (INDEPENDENT_AMBULATORY_CARE_PROVIDER_SITE_OTHER): Payer: BC Managed Care – PPO | Admitting: Physician Assistant

## 2012-09-11 ENCOUNTER — Encounter: Payer: Self-pay | Admitting: Physician Assistant

## 2012-09-11 VITALS — BP 148/86 | HR 96 | Wt 252.0 lb

## 2012-09-11 DIAGNOSIS — H7320 Unspecified myringitis, unspecified ear: Secondary | ICD-10-CM

## 2012-09-11 DIAGNOSIS — H9209 Otalgia, unspecified ear: Secondary | ICD-10-CM

## 2012-09-11 DIAGNOSIS — H9202 Otalgia, left ear: Secondary | ICD-10-CM

## 2012-09-11 MED ORDER — AZITHROMYCIN 250 MG PO TABS: ORAL_TABLET | ORAL | Status: DC

## 2012-09-11 MED ORDER — METHYLPREDNISOLONE (PAK) 4 MG PO TABS: 4.0000 mg | ORAL_TABLET | Freq: Every day | ORAL | Status: DC

## 2012-09-11 NOTE — Progress Notes (Signed)
  Subjective:    Patient ID: Brenda Sharp, female    DOB: 25-Jan-1981, 32 y.o.   MRN: [id number]  HPI  Patient comes in today to follow up on right ear pain. Her BP was extremely high on Monday and I wanted to give prednisone but did not feel comfortable with her blood pressure elevated to that extent. She has started Zyrtec and using Nasonex daily. She denies any relief in pain. She has not had any drainage from here. She has not taking any over-the-counter medications and has stayed away from decongestants. She denies any fever, chills, sore throat, upper respiratory symptoms.  Review of Systems     Objective:   Physical Exam  Constitutional: She appears well-developed and well-nourished.       Obese.  HENT:  Head: Normocephalic and atraumatic.  Right Ear: External ear normal.       No pain with palpation over tragus. Mild erythema in external ear. Left TM dull and  bulging with erythema obstructing view of ossicles. NO blood or pus. Light reflex not present.    Eyes: Conjunctivae normal are normal.  Neck: Normal range of motion. Neck supple.  Lymphadenopathy:    She has no cervical adenopathy.  Skin: Skin is warm and dry.  Psychiatric: She has a normal mood and affect. Her behavior is normal.          Assessment & Plan:  Left ear pain/TM inflammation- I started prednisone since BP went down. Continue on Zyrtec and nasonex. I am suspicious of infection forming. TM has become much worse over past 2 days. Gave zpak if not improving in next 24 hours. Continue to chew gum and take tylenol for pain control. Call if have any new symptoms.

## 2012-09-11 NOTE — Patient Instructions (Addendum)
Start prednisone and if not improving in 24 hours start zpak. Continue with nasal spray.

## 2012-09-17 ENCOUNTER — Other Ambulatory Visit: Payer: Self-pay | Admitting: *Deleted

## 2012-09-17 DIAGNOSIS — G43109 Migraine with aura, not intractable, without status migrainosus: Secondary | ICD-10-CM

## 2012-09-17 MED ORDER — DULOXETINE HCL 60 MG PO CPEP: 60.0000 mg | ORAL_CAPSULE | Freq: Every day | ORAL | Status: DC

## 2012-09-27 ENCOUNTER — Ambulatory Visit (INDEPENDENT_AMBULATORY_CARE_PROVIDER_SITE_OTHER): Payer: BC Managed Care – PPO | Admitting: Family Medicine

## 2012-09-27 ENCOUNTER — Encounter: Payer: Self-pay | Admitting: Family Medicine

## 2012-09-27 ENCOUNTER — Encounter (INDEPENDENT_AMBULATORY_CARE_PROVIDER_SITE_OTHER): Payer: BC Managed Care – PPO | Admitting: Family Medicine

## 2012-09-27 VITALS — BP 151/101 | HR 119 | Ht 68.0 in | Wt 248.0 lb

## 2012-09-27 DIAGNOSIS — R112 Nausea with vomiting, unspecified: Secondary | ICD-10-CM

## 2012-09-27 DIAGNOSIS — I10 Essential (primary) hypertension: Secondary | ICD-10-CM

## 2012-09-27 DIAGNOSIS — R197 Diarrhea, unspecified: Secondary | ICD-10-CM

## 2012-09-27 DIAGNOSIS — R1084 Generalized abdominal pain: Secondary | ICD-10-CM

## 2012-09-27 MED ORDER — PROMETHAZINE HCL 25 MG PO TABS: 25.0000 mg | ORAL_TABLET | Freq: Four times a day (QID) | ORAL | Status: DC | PRN

## 2012-09-27 NOTE — Progress Notes (Signed)
Subjective:    Patient ID: Brenda Sharp, female    DOB: 05/21/1981, 32 y.o.   MRN: [id number]  HPI  Yesterday afternoon started having indigestion and burping. Then felt exhausted. Then vomiting and diarrhea overnight.  + cold chills. Had similar  Ss about 3 weeks ago and 9 weeks ago. Lasted 3-4 days last time. Used some antinausea OTC - helps some. Hearing gurling in her stomach. When she burps she feels like vomiting. Hasn't eaten today. No appetite. Eating doesn't make it better or worse. Had a grilled chx sandwhich, soda, and fries before this started yesterday.  Has vmited about 5 times.     Review of Systems No dysphagia. No ST. No blood in stool.     BP 151/101  Pulse 119  Ht 5\' 8"  (1.727 m)  Wt 248 lb (112.492 kg)  BMI 37.72 kg/m2  LMP 08/31/2012    Allergies  Allergen Reactions  . Penicillins     No past medical history on file.  Past Surgical History  Procedure Laterality Date  . Tonsillectomy  1990  . Wisdom tooth extraction  2006    History   Social History  . Marital Status: Single    Spouse Name: N/A    Number of Children: 0  . 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      [employer]   Social History Main Topics  . Smoking status: Never Smoker   . Smokeless tobacco: Not on file  . Alcohol Use: No  . Drug Use: No  . Sexually Active: Not on file   Other Topics Concern  . Not on file   Social History Narrative   4 caffeine per day.  No regular exercise.  She lives with her father Dorene Sorrow    Family History  Problem Relation Age of Onset  . Lung cancer Paternal Grandfather     smoker  . Heart attack Paternal Grandmother   . Heart attack Paternal Grandfather   . Diabetes    . Heart attack Maternal Grandfather   . Hypertension Father   . Gout Father     Outpatient Encounter Prescriptions as of 09/27/2012  Medication Sig Dispense Refill  . amLODipine (NORVASC) 5 MG tablet Take 1 tablet (5 mg total) by  mouth daily.  90 tablet  1  . benazepril (LOTENSIN) 40 MG tablet Take 1 tablet (40 mg total) by mouth daily.  90 tablet  1  . colchicine 0.6 MG tablet Take 1 tablet (0.6 mg total) by mouth daily.  30 tablet  5  . DULoxetine (CYMBALTA) 60 MG capsule Take 1 capsule (60 mg total) by mouth daily.  90 capsule  1  . Febuxostat (ULORIC) 80 MG TABS Take 1 tablet (80 mg total) by mouth daily.  90 tablet  3  . Levothyroxine Sodium 75 MCG CAPS Take 1 capsule (75 mcg total) by mouth every morning. Take one hour before first meal of day.  90 capsule  1  . methylPREDNIsolone (MEDROL DOSPACK) 4 MG tablet Take 1 tablet (4 mg total) by mouth daily. follow package directions  21 tablet  0  . mometasone (NASONEX) 50 MCG/ACT nasal spray Place 2 sprays into the nose daily.  17 g  2  . nebivolol (BYSTOLIC) 5 MG tablet Take 1 tablet (5 mg total) by mouth daily.  90 tablet  1  . [DISCONTINUED] azithromycin (ZITHROMAX) 250 MG tablet 2 tabs now and then 1 tab for 4 days.  6 each  0  . [DISCONTINUED] DULoxetine (CYMBALTA) 30 MG capsule Take 1 capsule (30 mg total) by mouth daily.  30 capsule  0  . promethazine (PHENERGAN) 25 MG tablet Take 1 tablet (25 mg total) by mouth every 6 (six) hours as needed for nausea.  10 tablet  0   No facility-administered encounter medications on file as of 09/27/2012.       Objective:   Physical Exam  Constitutional: She is oriented to person, place, and time. She appears well-developed and well-nourished.  HENT:  Head: Normocephalic and atraumatic.  Cardiovascular: Normal rate, regular rhythm and normal heart sounds.   Pulmonary/Chest: Effort normal and breath sounds normal.  Abdominal: Soft. Bowel sounds are normal. She exhibits no distension and no mass. There is tenderness. There is no rebound and no guarding.  Mild generalized tenderness with deep palpation   Neurological: She is alert and oriented to person, place, and time.  Skin: Skin is warm and dry.  Psychiatric: She has a  normal mood and affect. Her behavior is normal.          Assessment & Plan:  Nausea/Vomiting/Diarrhea - could be viral but unusal to have happened 3 x in the last 8-9 weeks. I do think this warrants further w/u. Will start with labs. Check CBC, check pancreas, and liver. Will schedule for abd [place] to eval GB next week. Given phenergan in the meantime - Go to ED if suddenly gets worse over the weekend of if vomiting persistant. Looks well hydrated.    HTN - uncontrolled today but sick .Will recheck in 1 mo.

## 2012-09-27 NOTE — Patient Instructions (Addendum)
Start nexium once a day 30 min before evening meal.

## 2012-09-28 LAB — CBC WITH DIFFERENTIAL/PLATELET
Eosinophils Absolute: 0.1 10*3/uL (ref 0.0–0.7)
Eosinophils Relative: 1 % (ref 0–5)
HCT: 46.7 % — ABNORMAL HIGH (ref 36.0–46.0)
Hemoglobin: 16.5 g/dL — ABNORMAL HIGH (ref 12.0–15.0)
Lymphocytes Relative: 35 % (ref 12–46)
Lymphs Abs: 4 10*3/uL (ref 0.7–4.0)
MCH: 31.5 pg (ref 26.0–34.0)
MCV: 89.3 fL (ref 78.0–100.0)
Monocytes Absolute: 0.7 10*3/uL (ref 0.1–1.0)
Monocytes Relative: 6 % (ref 3–12)
Platelets: 307 10*3/uL (ref 150–400)
RBC: 5.23 MIL/uL — ABNORMAL HIGH (ref 3.87–5.11)
WBC: 11.6 10*3/uL — ABNORMAL HIGH (ref 4.0–10.5)

## 2012-09-28 LAB — HEPATIC FUNCTION PANEL
Albumin: 4.7 g/dL (ref 3.5–5.2)
Alkaline Phosphatase: 59 U/L (ref 39–117)
Indirect Bilirubin: 0.7 mg/dL (ref 0.0–0.9)
Total Bilirubin: 0.8 mg/dL (ref 0.3–1.2)
Total Protein: 7.1 g/dL (ref 6.0–8.3)

## 2012-09-28 LAB — LIPASE: Lipase: 29 U/L (ref 0–75)

## 2012-09-29 ENCOUNTER — Other Ambulatory Visit: Payer: Self-pay | Admitting: Family Medicine

## 2012-09-30 LAB — HEPATITIS PANEL, ACUTE
HCV Ab: NEGATIVE
Hep A IgM: NEGATIVE

## 2012-10-01 ENCOUNTER — Ambulatory Visit (HOSPITAL_BASED_OUTPATIENT_CLINIC_OR_DEPARTMENT_OTHER)
Admission: RE | Admit: 2012-10-01 | Discharge: 2012-10-01 | Disposition: A | Discharge status: Home / Self Care | Payer: BC Managed Care – PPO | Source: Ambulatory Visit | Attending: Family Medicine | Admitting: Family Medicine

## 2012-10-01 DIAGNOSIS — R109 Unspecified abdominal pain: Secondary | ICD-10-CM | POA: Insufficient documentation

## 2012-10-01 DIAGNOSIS — K7689 Other specified diseases of liver: Secondary | ICD-10-CM | POA: Insufficient documentation

## 2012-10-01 DIAGNOSIS — R197 Diarrhea, unspecified: Secondary | ICD-10-CM | POA: Insufficient documentation

## 2012-10-01 DIAGNOSIS — R112 Nausea with vomiting, unspecified: Secondary | ICD-10-CM | POA: Insufficient documentation

## 2012-10-11 ENCOUNTER — Ambulatory Visit: Payer: BC Managed Care – PPO | Admitting: Family Medicine

## 2012-10-18 ENCOUNTER — Ambulatory Visit (INDEPENDENT_AMBULATORY_CARE_PROVIDER_SITE_OTHER): Payer: BC Managed Care – PPO | Admitting: Family Medicine

## 2012-10-18 ENCOUNTER — Encounter: Payer: Self-pay | Admitting: Family Medicine

## 2012-10-18 VITALS — BP 164/104 | HR 102 | Wt 247.0 lb

## 2012-10-18 MED ORDER — NEBIVOLOL HCL 20 MG PO TABS: 20.0000 mg | ORAL_TABLET | Freq: Every day | ORAL | Status: DC

## 2012-10-18 MED ORDER — AZITHROMYCIN 250 MG PO TABS: ORAL_TABLET | ORAL | Status: DC

## 2012-10-18 NOTE — Progress Notes (Signed)
  Subjective:    Patient ID: Brenda Sharp, female    DOB: Nov 12, 1980, 32 y.o.   MRN: [id number]  HPI 5 days of nasal congestion and severe left ear pain. No fever. Thick driange form the nose.  No drainage from ear. No ST. Mild cough. No SOB.  No meds or pain relievers.  Last sinusitis was 5 weeks ago.  No worsening or alleviating symptoms.  HTN- Pt denies chest pain, SOB, dizziness, or heart palpitations.  Taking meds as directed w/o problems.    Episodes of nausea and vomiting-this point I think they're most likely viral in nature. Her ultrasound was fairly normal except for fatty liver and she would like to discuss that diagnosis today.   Review of Systems     Objective:   Physical Exam  Constitutional: She is oriented to person, place, and time. She appears well-developed and well-nourished.  HENT:  Head: Normocephalic and atraumatic.  Right Ear: External ear normal.  Left Ear: External ear normal.  Nose: Nose normal.  Mouth/Throat: Oropharynx is clear and moist.  Right TM is bulging, erythematous, and unable to see the ossicle.   Eyes: Conjunctivae and EOM are normal. Pupils are equal, round, and reactive to light.  Neck: Neck supple. No thyromegaly present.  Cardiovascular: Normal rate, regular rhythm and normal heart sounds.   Pulmonary/Chest: Effort normal and breath sounds normal. She has no wheezes.  Lymphadenopathy:    She has no cervical adenopathy.  Neurological: She is alert and oriented to person, place, and time.  Skin: Skin is warm and dry.  Psychiatric: She has a normal mood and affect.          Assessment & Plan:  Right OM/sinusitis-will treat with azithromycin since has penicillin allergy. This worked well for her during her last ear infection. Continue nasal steroid and antihistamine. Come back in for office visit if she's not significantly better in one week. I would like to recheck her ear her followup for her blood pressure.  HTN - uncontrolled.   Will increase bystolic to 20mg  F/U in 6 weeks. Call if any symptoms or side effects of the medications.  Fatty liver - Discussed dx and treatment with low fat diet, regular exercise and weight loss. We will continue to monitor her liver enzymes. Given a copy of the ultrasound report.

## 2012-11-29 ENCOUNTER — Other Ambulatory Visit (HOSPITAL_COMMUNITY)
Admission: RE | Admit: 2012-11-29 | Discharge: 2012-11-29 | Disposition: A | Discharge status: Home / Self Care | Payer: BC Managed Care – PPO | Source: Ambulatory Visit | Attending: Family Medicine | Admitting: Family Medicine

## 2012-11-29 ENCOUNTER — Telehealth: Payer: Self-pay | Admitting: Family Medicine

## 2012-11-29 ENCOUNTER — Ambulatory Visit (INDEPENDENT_AMBULATORY_CARE_PROVIDER_SITE_OTHER): Payer: BC Managed Care – PPO | Admitting: Family Medicine

## 2012-11-29 ENCOUNTER — Encounter: Payer: Self-pay | Admitting: Family Medicine

## 2012-11-29 VITALS — BP 132/90 | HR 89 | Wt 248.0 lb

## 2012-11-29 DIAGNOSIS — Z124 Encounter for screening for malignant neoplasm of cervix: Secondary | ICD-10-CM

## 2012-11-29 DIAGNOSIS — I10 Essential (primary) hypertension: Secondary | ICD-10-CM

## 2012-11-29 DIAGNOSIS — Z01419 Encounter for gynecological examination (general) (routine) without abnormal findings: Secondary | ICD-10-CM | POA: Insufficient documentation

## 2012-11-29 DIAGNOSIS — Z3169 Encounter for other general counseling and advice on procreation: Secondary | ICD-10-CM

## 2012-11-29 DIAGNOSIS — E8881 Metabolic syndrome: Secondary | ICD-10-CM

## 2012-11-29 MED ORDER — HYDROCHLOROTHIAZIDE 25 MG PO TABS: 25.0000 mg | ORAL_TABLET | Freq: Every day | ORAL | Status: DC

## 2012-11-29 NOTE — Patient Instructions (Signed)
Encouraged to start a regular exercise program in addition to continue to work on diet. I recommend about a 3-4 pound weight loss per month. When you get low on the benazepril and the amlodipine please let me know. I would like to try switching you to Tribenzor at that time. If you feel the hydrochlorothiazide, diuretic, is a little too strong he can cut it in half and we can change it to 12.5 mg.

## 2012-11-29 NOTE — Telephone Encounter (Signed)
Recommendation would be to test hormone levels about a week before you're due for your period. If you haven't started by Monday we can go on Monday for bloodwork if you would like. Also after you left I thought about it some more and he might actually have a syndrome called polycystic ovarian syndrome. Typically it is in women who are overweight, have high blood pressure and abnormal blood sugars as well as fertility issues. Often times will use a medication that is also using diabetes called metformin. It actually seems to promote fertility and we could certainly start this drug with you if you're interested.

## 2012-11-29 NOTE — Progress Notes (Signed)
Subjective:    Patient ID: Brenda Sharp, female    DOB: 1980/11/18, 32 y.o.   MRN: [id number]  HPI HTN-  Pt denies chest pain, SOB, dizziness, or heart palpitations.  Taking meds as directed w/o problems.  Denies medication side effects.      Review of Systems     Objective:   Physical Exam        Assessment & Plan:   Subjective:     Brenda Sharp is a 32 y.o. female and is here for a comprehensive physical exam. The patient reports problems - she is trying to get pregnant. She is on  a PNV. Get her period every 6 weeks.  She started using LH kits in FEb (3 months).  History   Social History  . Marital Status: Single    Spouse Name: N/A    Number of Children: 0  . 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      [employer]   Social History Main Topics  . Smoking status: Never Smoker   . Smokeless tobacco: Not on file  . Alcohol Use: No  . Drug Use: No  . Sexually Active: Not on file   Other Topics Concern  . Not on file   Social History Narrative   4 caffeine per day.  No regular exercise.  She lives with her father Dorene Sorrow   Health Maintenance  Topic Date Due  . Pap Smear  04/01/2013  . Influenza Vaccine  04/07/2013  . Tetanus/tdap  11/14/2020    The following portions of the patient's history were reviewed and updated as appropriate: allergies, current medications, past family history, past medical history, past social history, past surgical history and problem list.  Review of Systems A comprehensive review of systems was negative.   Objective:    BP 132/90  Pulse 89  Wt 248 lb (112.492 kg)  BMI 37.72 kg/m2 General appearance: alert, cooperative and appears stated age Head: Normocephalic, without obvious abnormality, atraumatic Eyes: conj clear, EOMi, PEERLA Ears: normal TM's and external ear canals both ears Nose: Nares normal. Septum midline. Mucosa normal. No drainage or sinus tenderness. Throat: lips,  mucosa, and tongue normal; teeth and gums normal Neck: no adenopathy, supple, symmetrical, trachea midline and thyroid not enlarged, symmetric, no tenderness/mass/nodules Back: symmetric, no curvature. ROM normal. No CVA tenderness. Lungs: clear to auscultation bilaterally Breasts: normal appearance, no masses or tenderness Heart: regular rate and rhythm, S1, S2 normal, no murmur, click, rub or gallop Abdomen: soft, non-tender; bowel sounds normal; no masses,  no organomegaly Pelvic: cervix normal in appearance, external genitalia normal, no adnexal masses or tenderness, no cervical motion tenderness, rectovaginal septum normal, uterus normal size, shape, and consistency and vagina normal without discharge Extremities: extremities normal, atraumatic, no cyanosis or edema Pulses: 2+ and symmetric Skin: Skin color, texture, turgor normal. No rashes or lesions Lymph nodes: Cervical, supraclavicular, and axillary nodes normal. Neurologic: Alert and oriented X 3, normal strength and tone. Normal symmetric reflexes. Normal coordination and gait    Assessment:    Healthy female exam.      Plan:     See After Visit Summary for Counseling Recommendations  Keep up a regular exercise program and make sure you are eating a healthy diet Try to eat 4 servings of dairy a day, or if you are lactose intolerant take a calcium with vitamin D daily.  Your vaccines are up to date.   In fertility  counseling. - We discussed LH kits and testing hormone levels. Once has tried for ayear may consider HSG, and additional w/u. Make sure continue PNV.  She also has history of impaired fasting glucose, hypertension and obesity. She most likely has PCO S. but she does not have a formal diagnosis. Discussed the importance of regular exercise and weight loss. She would also probably be a good candidate for metformin.  HTN - systolic is well-controlled her looks great today but her diastolic is still borderline. She says  at home her diastolic usually runs between 90 and 100 as well. We will add hydrochlorothiazide 25 mg to her current regimen. She says she has lots of the benazepril and amlodipine. When she gets a little low on meds,  can try stopping those 2 in addition to her HCTZ and starting Tribenzor instead. Would recommend Tribenzor 40/5/25 once a day. She would then continue to Bystolic as well. She can call the office when she she is due for refillls. Otherwise followup in 2 months for blood pressure. Given coupon card today.

## 2012-12-02 NOTE — Telephone Encounter (Signed)
Called and left message with pt's husband for her to call back.Brenda Sharp

## 2012-12-04 MED ORDER — METFORMIN HCL 500 MG PO TABS: 500.0000 mg | ORAL_TABLET | Freq: Two times a day (BID) | ORAL | Status: DC

## 2012-12-04 NOTE — Telephone Encounter (Signed)
Called pt and informed her of Dr. Shelah Lewandowsky recommendations and she is interested in starting the meds and will come in and have blood work done.Loralee Pacas [place]

## 2012-12-19 ENCOUNTER — Encounter: Payer: Self-pay | Admitting: Family Medicine

## 2012-12-19 ENCOUNTER — Ambulatory Visit (INDEPENDENT_AMBULATORY_CARE_PROVIDER_SITE_OTHER): Payer: BC Managed Care – PPO | Admitting: Family Medicine

## 2012-12-19 VITALS — BP 135/91 | HR 94 | Temp 100.4°F | Wt 248.0 lb

## 2012-12-19 DIAGNOSIS — J029 Acute pharyngitis, unspecified: Secondary | ICD-10-CM

## 2012-12-19 DIAGNOSIS — R509 Fever, unspecified: Secondary | ICD-10-CM

## 2012-12-19 DIAGNOSIS — G43109 Migraine with aura, not intractable, without status migrainosus: Secondary | ICD-10-CM

## 2012-12-19 MED ORDER — AMOXICILLIN 875 MG PO TABS: 875.0000 mg | ORAL_TABLET | Freq: Two times a day (BID) | ORAL | Status: DC

## 2012-12-19 MED ORDER — DULOXETINE HCL 60 MG PO CPEP: 60.0000 mg | ORAL_CAPSULE | Freq: Every day | ORAL | Status: DC

## 2012-12-19 NOTE — Progress Notes (Signed)
  Subjective:    Patient ID: Brenda Sharp, female    DOB: 1981-01-26, 32 y.o.   MRN: [id number]  HPI ST X 1 WEEKS.  Feels LIKE a sharp pinch in her throat. Coughin x 2 days. Right ear pain started last night.  + fever.  Painful to eat and swallow. No nasal congestion or runny nose.  Dec appetite.    Review of Systems     Objective:   Physical Exam  Constitutional: She is oriented to person, place, and time. She appears well-developed and well-nourished.  HENT:  Head: Normocephalic and atraumatic.  Right Ear: External ear normal.  Left Ear: External ear normal.  Nose: Nose normal.  Mouth/Throat: Oropharynx is clear and moist.  TMs and canals are clear. Op with erythematous areas (splotchey)  Eyes: Conjunctivae and EOM are normal. Pupils are equal, round, and reactive to light.  Neck: Neck supple. No thyromegaly present.  Cardiovascular: Normal rate, regular rhythm and normal heart sounds.   Pulmonary/Chest: Effort normal and breath sounds normal. She has no wheezes.  Lymphadenopathy:    She has no cervical adenopathy.  Neurological: She is alert and oriented to person, place, and time.  Skin: Skin is warm and dry.  Psychiatric: She has a normal mood and affect.          Assessment & Plan:  Pharyngitis - evaluate for strep with rapid test. Neg for strep.  Could be viral vs bacterial but she does have a fever on Day 5 of illness which is usual. Call if ear pain gets worse. Can use Tylenol or ibuprofen for fever.

## 2013-01-24 ENCOUNTER — Ambulatory Visit (INDEPENDENT_AMBULATORY_CARE_PROVIDER_SITE_OTHER): Payer: BC Managed Care – PPO | Admitting: Family Medicine

## 2013-01-24 ENCOUNTER — Encounter: Payer: Self-pay | Admitting: Family Medicine

## 2013-01-24 VITALS — BP 132/91 | HR 103 | Temp 98.4°F | Wt 241.0 lb

## 2013-01-24 DIAGNOSIS — H6980 Other specified disorders of Eustachian tube, unspecified ear: Secondary | ICD-10-CM

## 2013-01-24 DIAGNOSIS — H6982 Other specified disorders of Eustachian tube, left ear: Secondary | ICD-10-CM

## 2013-01-24 MED ORDER — MONTELUKAST SODIUM 10 MG PO TABS: 10.0000 mg | ORAL_TABLET | Freq: Every day | ORAL | Status: DC

## 2013-01-24 MED ORDER — PREDNISONE 20 MG PO TABS: ORAL_TABLET | ORAL | Status: AC

## 2013-01-24 NOTE — Progress Notes (Signed)
CC: Brenda Sharp is a 32 y.o. female is here for Otalgia and Cough   Subjective: HPI:  Patient complains of left ear pressure and pain described only as pressure that is mild in severity. Waxes and wanes throughout the day, feels a constant popping sensation when opening the jaw. Has been associated with a nonproductive cough mild in severity. Symptoms have been present for 3 days acute onset without any change in overall severity since onset. Nothing particularly makes better or worse no interventions as of yet. She denies fevers, chills, shortness of breath, wheezing, nasal congestion, facial pressure, sore throat   Review Of Systems Outlined In HPI  No past medical history on file.   Family History  Problem Relation Age of Onset  . Lung cancer Paternal Grandfather     smoker  . Heart attack Paternal Grandmother   . Heart attack Paternal Grandfather   . Diabetes    . Heart attack Maternal Grandfather   . Hypertension Father   . Gout Father      History  Substance Use Topics  . Smoking status: Never Smoker   . Smokeless tobacco: Not on file  . Alcohol Use: No     Objective: Filed Vitals:   01/24/13 1603  BP: 132/91  Pulse: 103  Temp: 98.4 F (36.9 C)    General: Alert and Oriented, No Acute Distress HEENT: Pupils equal, round, reactive to light. Conjunctivae clear.  External ears unremarkable, canals clear with intact TMs with appropriate landmarks.  Middle ear appears open without effusion. Pink inferior turbinates.  Moist mucous membranes, pharynx without inflammation nor lesions.  Neck supple without palpable lymphadenopathy nor abnormal masses. Lungs: Clear to auscultation bilaterally, no wheezing/ronchi/rales.  Comfortable work of breathing. Good air movement. Cardiac: Regular rate and rhythm. Normal S1/S2.  No murmurs, rubs, nor gallops.   Extremities: No peripheral edema.  Strong peripheral pulses.  Mental Status: No depression, anxiety, nor agitation. Skin:  Warm and dry.  Assessment & Plan: Brenda Sharp was seen today for otalgia and cough.  Diagnoses and associated orders for this visit:  Eustachian tube dysfunction, left - predniSONE (DELTASONE) 20 MG tablet; Three tabs daily days 1-3, two tabs daily days 4-6, one tab daily days 7-9, half tab daily days 10-13. - montelukast (SINGULAIR) 10 MG tablet; Take 1 tablet (10 mg total) by mouth daily.    I believe she is suffering from eustachian tube dysfunction, I've encouraged her to start over-the-counter Zyrtec, continue  nasal steroid, start prednisone taper to aggressively treat symptoms can consider starting on daily Singulair to maximize prevention of any allergy etiology  Return if symptoms worsen or fail to improve.

## 2013-05-13 ENCOUNTER — Ambulatory Visit (INDEPENDENT_AMBULATORY_CARE_PROVIDER_SITE_OTHER): Payer: BC Managed Care – PPO | Admitting: Family Medicine

## 2013-05-13 ENCOUNTER — Encounter: Payer: Self-pay | Admitting: Family Medicine

## 2013-05-13 VITALS — BP 147/101 | HR 87 | Wt 247.0 lb

## 2013-05-13 DIAGNOSIS — N912 Amenorrhea, unspecified: Secondary | ICD-10-CM

## 2013-05-13 DIAGNOSIS — I10 Essential (primary) hypertension: Secondary | ICD-10-CM

## 2013-05-13 DIAGNOSIS — G43109 Migraine with aura, not intractable, without status migrainosus: Secondary | ICD-10-CM

## 2013-05-13 DIAGNOSIS — E039 Hypothyroidism, unspecified: Secondary | ICD-10-CM

## 2013-05-13 DIAGNOSIS — R21 Rash and other nonspecific skin eruption: Secondary | ICD-10-CM

## 2013-05-13 DIAGNOSIS — Z23 Encounter for immunization: Secondary | ICD-10-CM

## 2013-05-13 DIAGNOSIS — M109 Gout, unspecified: Secondary | ICD-10-CM

## 2013-05-13 MED ORDER — DULOXETINE HCL 60 MG PO CPEP: 60.0000 mg | ORAL_CAPSULE | Freq: Every day | ORAL | Status: DC

## 2013-05-13 MED ORDER — BENAZEPRIL HCL 40 MG PO TABS: 40.0000 mg | ORAL_TABLET | Freq: Every day | ORAL | Status: DC

## 2013-05-13 NOTE — Progress Notes (Signed)
  Subjective:    Patient ID: Brenda Sharp, female    DOB: [birth year], 32 y.o.   MRN: [id number]  HPI on lower abdomen x 1 week itchey rash started at her right hip and spread towards the middle of the abdomen. she has not changed soap, lotion, detergent, or had contatct with any unknown animals. she has used cortisone and goldbond for eczema with no relief. No traum or irritation.did have fever and chills about 2 nights ago but none since then.  No ST. she does have allergies to metals but says that none of her pants have had any type of metal buttons or adornments  HTN- Pt denies chest pain, SOB, dizziness, or heart palpitations.  Taking meds as directed w/o problems.  Denies medication side effects.  Out of her Bp meds.   Fertility-she would like referral to an OB/GYN in [place]. She is now married and would like to try to get pregnant. She has very irregular cycles. She had 3 regular cycles over the summer but it has stopped since then she has not had a period in the last 2 months. She's had several negative pregnancy tests at home. She would like assistance with fertility.  Gout-no recent flares. She's currently taking Uloric. She is due for repeat uric acid level.  Review of Systems     Objective:   Physical Exam  Constitutional: She is oriented to person, place, and time. She appears well-developed and well-nourished.  HENT:  Head: Normocephalic and atraumatic.  Cardiovascular: Normal rate, regular rhythm and normal heart sounds.   Pulmonary/Chest: Effort normal and breath sounds normal.  Abdominal:    She has a fairly large area of erythematous rash. There is no significant scale or discharge or breaks in the skin. No excoriations. There are fairly large bruises along the borders of the rash. They appear to be healing. No edema or hives or papules.  Neurological: She is alert and oriented to person, place, and time.  Skin: Skin is warm and dry.  Psychiatric: She has a normal mood  and affect. Her behavior is normal.          Assessment & Plan:  HTN - uncontrolled but out of meds. Will refill and restart them. F/u in 3 months.  Due for CMP.  Rash-unclear etiology. She has some significant bruising around the edges of the rash. She thinks this may have occurred from trying to scratch it. She denies any trauma or injury. We did do a skin scraping to send for KOH evaluation. We'll call her back with results. If it's negative will treat with topical steroid cream. Asked her call me if she feels like the rash is getting hot or spreading or if she feels that she's getting any recurrent fevers or chills. I did not see any actual breaks in the skin.  Infertility-will refer to OB/GYN practicing [facility] per patient preference. I encouraged her to discuss with him whether not we need to go ahead and transfer her medications as she is on several agents are contraindicated during pregnancy.  Gout-well controlled. Due for your acid level.

## 2013-05-14 ENCOUNTER — Other Ambulatory Visit: Payer: Self-pay | Admitting: Family Medicine

## 2013-05-14 LAB — KOH PREP: RESULT - KOH: NONE SEEN

## 2013-05-14 MED ORDER — TRIAMCINOLONE ACETONIDE 0.5 % EX OINT: TOPICAL_OINTMENT | Freq: Two times a day (BID) | CUTANEOUS | Status: DC

## 2013-05-30 ENCOUNTER — Other Ambulatory Visit: Payer: Self-pay | Admitting: *Deleted

## 2013-05-30 DIAGNOSIS — G43109 Migraine with aura, not intractable, without status migrainosus: Secondary | ICD-10-CM

## 2013-05-30 MED ORDER — COLCHICINE 0.6 MG PO TABS: 0.6000 mg | ORAL_TABLET | Freq: Every day | ORAL | Status: DC

## 2013-05-30 MED ORDER — LEVOTHYROXINE SODIUM 75 MCG PO CAPS: 75.0000 ug | ORAL_CAPSULE | ORAL | Status: DC

## 2013-08-21 ENCOUNTER — Encounter: Payer: Self-pay | Admitting: Family Medicine

## 2013-08-21 ENCOUNTER — Ambulatory Visit (INDEPENDENT_AMBULATORY_CARE_PROVIDER_SITE_OTHER): Payer: BC Managed Care – PPO | Admitting: Family Medicine

## 2013-08-21 VITALS — BP 137/91 | HR 109 | Temp 98.0°F | Ht 63.0 in | Wt 248.0 lb

## 2013-08-21 DIAGNOSIS — G43109 Migraine with aura, not intractable, without status migrainosus: Secondary | ICD-10-CM

## 2013-08-21 DIAGNOSIS — E039 Hypothyroidism, unspecified: Secondary | ICD-10-CM

## 2013-08-21 DIAGNOSIS — M109 Gout, unspecified: Secondary | ICD-10-CM

## 2013-08-21 DIAGNOSIS — I10 Essential (primary) hypertension: Secondary | ICD-10-CM

## 2013-08-21 DIAGNOSIS — Z331 Pregnant state, incidental: Secondary | ICD-10-CM

## 2013-08-21 DIAGNOSIS — Z3490 Encounter for supervision of normal pregnancy, unspecified, unspecified trimester: Secondary | ICD-10-CM

## 2013-08-21 MED ORDER — DULOXETINE HCL 30 MG PO CPEP: 30.0000 mg | ORAL_CAPSULE | Freq: Every day | ORAL | Status: DC

## 2013-08-21 MED ORDER — LABETALOL HCL 100 MG PO TABS: 100.0000 mg | ORAL_TABLET | Freq: Two times a day (BID) | ORAL | Status: DC

## 2013-08-21 NOTE — Progress Notes (Signed)
   Subjective:    Patient ID: Brenda Sharp, female    DOB: [birth year], 33 y.o.   MRN: [id number]  HPI Here to discuss medications. She is currently [redacted] weeks pregnant. She did go for serum hCG testing today and had not doubled like it should have so she is going back for repeat on Monday. She's a little bit worried about that today. But she is mostly here for me to go through her medications that we can make adjustments that are appropriate for pregnancy.   Review of Systems     Objective:   Physical Exam  Constitutional: She is oriented to person, place, and time. She appears well-developed and well-nourished.  HENT:  Head: Normocephalic and atraumatic.  Neck: Neck supple. No thyromegaly present.  Cardiovascular: Normal rate, regular rhythm and normal heart sounds.   Pulmonary/Chest: Effort normal and breath sounds normal.  Lymphadenopathy:    She has no cervical adenopathy.  Neurological: She is alert and oriented to person, place, and time.  Skin: Skin is warm and dry.  Psychiatric: She has a normal mood and affect. Her behavior is normal.          Assessment & Plan:  Gout-will stop Uloric and colchicine. We'll have to monitor for gout flares. Inserts to work very well for her so we may have to use short course of prednisone. She says prednisone actually works very well for her and usually takes a couple of doses and she gets significant relief.  Insulin resistance-will continue metformin for now.  Hypertension-will start labetalol 100 mg twice a day and stop the amlodipine and Bystolic and ACE inhibitor.  Hypothyroidism-continue thyroid medication.  Mood disorder-we'll decrease Cymbalta to 30 mg. Try to wean as tolerated during the pregnancy.  Allergic rhinitis-she's not currently taking her Nasonex or Singulair. Okay to restart in the spring if needed.

## 2013-08-25 ENCOUNTER — Telehealth: Payer: Self-pay | Admitting: *Deleted

## 2013-08-25 MED ORDER — PROBENECID 500 MG PO TABS: 500.0000 mg | ORAL_TABLET | Freq: Every day | ORAL | Status: DC

## 2013-08-25 MED ORDER — COLCHICINE 0.6 MG PO TABS: 0.6000 mg | ORAL_TABLET | Freq: Every day | ORAL | Status: DC | PRN

## 2013-08-25 NOTE — Telephone Encounter (Signed)
Please tell her that I did do some research. Ibuprofen is considered first choice for gout attacks during pregnancy but I know she doesn't typically get a response with that. Colchicine is actually considered a second drug of choice over prednisone. Thus we can use colchicine. Use briefly for an attack and try to come off as soon as possible. No more than 2 tabs in one day. Since she stopped the allopurinol we can also look at putting her on another medication called probenecid. It is the drug of choice during pregnancy to help eliminate uric acid. She will just need to let her the know if she's the colchicine and she will have to have a detailed fetal ultrasound if colchicine is used in the first  at trimester

## 2013-08-25 NOTE — Telephone Encounter (Signed)
Pt would like prednisone to be sent for gout flare to [facility]. She cannot take colchicine due to pregnancy.Audelia Hives [place]

## 2013-08-25 NOTE — Telephone Encounter (Signed)
Pt called back and would like to know if you are going to call in the new medication? She is having flare in ankle and great toe now and is in pain.Audelia Hives [place]

## 2013-08-25 NOTE — Telephone Encounter (Signed)
Both Rx sent.

## 2013-08-26 NOTE — Telephone Encounter (Signed)
Dr.Moody called back and spoke w/Dr. Madilyn Fireman .Brenda Sharp [place]

## 2013-08-26 NOTE — Telephone Encounter (Signed)
Prednisone is category D. which is actually worse in category C. No need to speak with her OB/GYN to clarify what we can do for her for her gout attacks.

## 2013-08-26 NOTE — Telephone Encounter (Signed)
Called [facility] [phone] to find out what med would be best to give to pt for gout flares. Was told that the triage nurse would contact either dr. Madilyn Fireman or myself with what pt can take.Brenda Sharp [place]

## 2013-08-26 NOTE — Telephone Encounter (Signed)
Pt stated colchicine that when she spoke w/you at her last appt she informed you that this was a level C med and should not be taken during pregnancy and was told not to take she stated that she did take a few and she is now bleeding and it did not help. She has d/c'd and would like for the prednisone to be called in please.Brenda Sharp

## 2013-08-26 NOTE — Telephone Encounter (Signed)
Spoke with Wells Guiles with Dr. Toma Copier office [facility] and was told that the message was out to Dr. Toma Copier and was marked urgent so I asked did that mean we would get an answer back today and she said she did not know just depends on when the MD answered. Clemetine Marker, LPN

## 2013-08-26 NOTE — Telephone Encounter (Signed)
Pt called and informed that she can take the colchicine. She was told to take 2 tabs on the 1st day and 1 tab each day afterwards. 2 tylenol ES for pain.Audelia Hives [place]

## 2013-09-11 ENCOUNTER — Ambulatory Visit: Payer: BC Managed Care – PPO | Admitting: Family Medicine

## 2013-09-12 ENCOUNTER — Inpatient Hospital Stay (HOSPITAL_COMMUNITY): Payer: BC Managed Care – PPO

## 2013-09-12 ENCOUNTER — Inpatient Hospital Stay (HOSPITAL_COMMUNITY)
Admission: AD | Admit: 2013-09-12 | Discharge: 2013-09-12 | Disposition: A | Discharge status: Home / Self Care | Payer: BC Managed Care – PPO | Source: Ambulatory Visit | Attending: Obstetrics & Gynecology | Admitting: Obstetrics & Gynecology

## 2013-09-12 ENCOUNTER — Encounter (HOSPITAL_COMMUNITY): Payer: Self-pay | Admitting: *Deleted

## 2013-09-12 DIAGNOSIS — O036 Delayed or excessive hemorrhage following complete or unspecified spontaneous abortion: Secondary | ICD-10-CM | POA: Insufficient documentation

## 2013-09-12 DIAGNOSIS — I10 Essential (primary) hypertension: Secondary | ICD-10-CM | POA: Insufficient documentation

## 2013-09-12 DIAGNOSIS — G43909 Migraine, unspecified, not intractable, without status migrainosus: Secondary | ICD-10-CM | POA: Insufficient documentation

## 2013-09-12 HISTORY — DX: Hypothyroidism, unspecified: E03.9

## 2013-09-12 HISTORY — DX: Calculus of kidney: N20.0

## 2013-09-12 HISTORY — DX: Headache: R51

## 2013-09-12 HISTORY — DX: Essential (primary) hypertension: I10

## 2013-09-12 HISTORY — DX: Polycystic ovarian syndrome: E28.2

## 2013-09-12 LAB — CBC
HCT: 42.6 % (ref 36.0–46.0)
Hemoglobin: 15 g/dL (ref 12.0–15.0)
MCH: 32 pg (ref 26.0–34.0)
MCHC: 35.2 g/dL (ref 30.0–36.0)
MCV: 90.8 fL (ref 78.0–100.0)
PLATELETS: 361 10*3/uL (ref 150–400)
RBC: 4.69 MIL/uL (ref 3.87–5.11)
RDW: 13.7 % (ref 11.5–15.5)
WBC: 14.3 10*3/uL — AB (ref 4.0–10.5)

## 2013-09-12 MED ORDER — HYDRALAZINE HCL 20 MG/ML IJ SOLN
5.0000 mg | Freq: Once | INTRAMUSCULAR | Status: AC
  Administered 2013-09-12: 5 mg via INTRAVENOUS
  Filled 2013-09-12: qty 1

## 2013-09-12 MED ORDER — DIPHENHYDRAMINE HCL 50 MG/ML IJ SOLN
25.0000 mg | Freq: Once | INTRAMUSCULAR | Status: AC
  Administered 2013-09-12: 25 mg via INTRAVENOUS
  Filled 2013-09-12: qty 1

## 2013-09-12 MED ORDER — MEPERIDINE HCL 25 MG/ML IJ SOLN
25.0000 mg | Freq: Once | INTRAMUSCULAR | Status: AC
  Administered 2013-09-12: 25 mg via INTRAVENOUS
  Filled 2013-09-12: qty 1

## 2013-09-12 MED ORDER — LACTATED RINGERS IV SOLN
INTRAVENOUS | Status: DC
  Administered 2013-09-12: 17:00:00 via INTRAVENOUS

## 2013-09-12 MED ORDER — LABETALOL HCL 5 MG/ML IV SOLN
20.0000 mg | Freq: Once | INTRAVENOUS | Status: AC
  Administered 2013-09-12: 20 mg via INTRAVENOUS
  Filled 2013-09-12: qty 4

## 2013-09-12 MED ORDER — DEXAMETHASONE SODIUM PHOSPHATE 10 MG/ML IJ SOLN
10.0000 mg | Freq: Once | INTRAMUSCULAR | Status: AC
  Administered 2013-09-12: 10 mg via INTRAVENOUS
  Filled 2013-09-12: qty 1

## 2013-09-12 MED ORDER — METOCLOPRAMIDE HCL 5 MG/ML IJ SOLN
10.0000 mg | Freq: Once | INTRAMUSCULAR | Status: AC
  Administered 2013-09-12: 10 mg via INTRAVENOUS
  Filled 2013-09-12: qty 2

## 2013-09-12 NOTE — MAU Note (Signed)
Pt arrived by EMS.  Vaginal bleeding started this weekend, became very heavy today, soaked through pants.  Migraine since last night, hasn't taken any meds.  Hx of HTN, on meds.

## 2013-09-12 NOTE — MAU Provider Note (Addendum)
History     CSN: [id number]  Arrival date and time: 09/12/13 1608   First Provider Initiated Contact with Patient 09/12/13 1712      Chief Complaint  Patient presents with  . Threatened Miscarriage  . Hypertension  . Migraine   HPI 33 yo, G1 at 7.5 wks, presented with heavy vaginal bleeding since this afternoon and presented here with Paramedics. Patient also has severe migraine since yesterday and says only Demerol works. She was a spontaneous pregnancy with several medical problems, incl Gout with flair in 1st trim needing Colchicine, usually well controlled HTN but elevated today, didn't take Labetalol this morning (on BID Labetalol).    Past Medical History  Diagnosis Date  . Hypertension   . Headache(784.0)   . Hypothyroidism   . Kidney stone     Past Surgical History  Procedure Laterality Date  . Tonsillectomy  1990  . Wisdom tooth extraction  2006  . Lithotripsy      Family History  Problem Relation Age of Onset  . Lung cancer Paternal Grandfather     smoker  . Heart attack Paternal Grandmother   . Heart attack Paternal Grandfather   . Diabetes    . Heart attack Maternal Grandfather   . Hypertension Father   . Gout Father     History  Substance Use Topics  . Smoking status: Never Smoker   . Smokeless tobacco: Not on file  . Alcohol Use: No    Allergies: No Known Allergies  Prescriptions prior to admission  Medication Sig Dispense Refill  . acetaminophen (TYLENOL) 500 MG tablet Take 1,000 mg by mouth every 6 (six) hours as needed for moderate pain.      Marland Kitchen colchicine 0.6 MG tablet Take 0.6 mg by mouth daily.      . Docosahexaenoic Acid (PRENATAL DHA PO) Take 1 tablet by mouth daily.      . DULoxetine (CYMBALTA) 30 MG capsule Take 1 capsule (30 mg total) by mouth daily.  90 capsule  1  . labetalol (NORMODYNE) 100 MG tablet Take 1 tablet (100 mg total) by mouth 2 (two) times daily.  60 tablet  3  . Levothyroxine Sodium 75 MCG CAPS Take 1 capsule (75  mcg total) by mouth every morning. Take one hour before first meal of day.  90 capsule  1  . metFORMIN (GLUCOPHAGE) 500 MG tablet Take 1 tablet (500 mg total) by mouth 2 (two) times daily with a meal.  60 tablet  3  . probenecid (BENEMID) 500 MG tablet Take 1 tablet (500 mg total) by mouth daily.  30 tablet  6    ROS Physical Exam   Blood pressure 150/106, pulse 88, temperature 98.3 F (36.8 C), temperature source Oral, resp. rate 22, last menstrual period 07/20/2013.  Physical Exam Elevated BP, s/p IV Labetalol 20mg  and will give Hydralazine 5 mg IV now.  A&O x 3, no acute distress but has her eyes covered due to migraine. Lungs CTA bilat CV RRR, S1S2 normal Abdo soft, non tender, non acute Extr no edema/ tenderness Pelvic Cervix closed, minimal active bleeding noted. Uterus 6 wks.   MAU Course  Procedures Pelvic sonogram- thick stripe but no blood flow noted,  No gestational sac noted (was seen in office sono on 09/08/13- 5.5 wks), normal adnexa.    Assessment and Plan  33 yo, 7 wks Spontaneous abortion, bleeding better, thick tissue but closed cervix. Can hold off D&C at this point and recheck sono in 2 wks  in office. Rh(+) per office labs. CBC sent.  HTN- Labetalol 20mg  IV, Hydralazine 5 mg IV, repeat BP every 22min. Migraine- Demerol and reassess    Antwione Picotte R 09/12/2013, 5:25 PM   BP better, Migraine better w Decadron, Reglan and Benadryl combo.  D/c Home, see me in office in 2 wks for sono, bleeding precautions reviewed, pelvic rest.

## 2013-09-12 NOTE — MAU Note (Signed)
Started spotting on Sun, was passing small clots. Called MD's office, ? SAB.  Bleeding has gotten heavier- now passing larger clots. Cramping in abd.  EMS called for migraine, has not taken anything for her HA.  Nauseated with HA. BP reported elevated by EMS (160-170/90-100), is on BP medication- last dose 02/05.

## 2013-09-12 NOTE — MAU Note (Signed)
PT IS LYING ON LEFT SIDE-  SAYS H/A  WORSE THAN  "FUNNY  FEELING".  HAS CLOTH ON FOREHEAD.

## 2013-09-12 NOTE — Discharge Instructions (Signed)
CALL Monday   FOR  A FOLLOW-UP  APPOINTMENT  IN 2 WEEKS.  CONTINUE  TAKING LABETALOL-  IF  IT WORKS-,     IF IT DOES NOT WORK  THEN SWITCH  BACK TO PRE-PREG MEDS.

## 2013-09-19 ENCOUNTER — Ambulatory Visit: Payer: BC Managed Care – PPO | Admitting: Family Medicine

## 2013-09-19 DIAGNOSIS — Z0289 Encounter for other administrative examinations: Secondary | ICD-10-CM

## 2013-10-17 ENCOUNTER — Ambulatory Visit (INDEPENDENT_AMBULATORY_CARE_PROVIDER_SITE_OTHER): Payer: BC Managed Care – PPO | Admitting: Family Medicine

## 2013-10-17 ENCOUNTER — Ambulatory Visit (INDEPENDENT_AMBULATORY_CARE_PROVIDER_SITE_OTHER): Payer: BC Managed Care – PPO

## 2013-10-17 ENCOUNTER — Encounter: Payer: Self-pay | Admitting: Family Medicine

## 2013-10-17 VITALS — BP 142/93 | HR 92 | Temp 97.9°F | Wt 251.0 lb

## 2013-10-17 DIAGNOSIS — R062 Wheezing: Secondary | ICD-10-CM

## 2013-10-17 DIAGNOSIS — R059 Cough, unspecified: Secondary | ICD-10-CM

## 2013-10-17 DIAGNOSIS — R05 Cough: Secondary | ICD-10-CM

## 2013-10-17 DIAGNOSIS — R0602 Shortness of breath: Secondary | ICD-10-CM

## 2013-10-17 DIAGNOSIS — I10 Essential (primary) hypertension: Secondary | ICD-10-CM

## 2013-10-17 DIAGNOSIS — G43909 Migraine, unspecified, not intractable, without status migrainosus: Secondary | ICD-10-CM

## 2013-10-17 MED ORDER — ALBUTEROL SULFATE HFA 108 (90 BASE) MCG/ACT IN AERS
2.0000 | INHALATION_SPRAY | Freq: Four times a day (QID) | RESPIRATORY_TRACT | Status: DC | PRN
Start: 2013-10-17 — End: 2014-05-25

## 2013-10-17 MED ORDER — LABETALOL HCL 200 MG PO TABS: 200.0000 mg | ORAL_TABLET | Freq: Two times a day (BID) | ORAL | Status: DC

## 2013-10-17 MED ORDER — HYDROCODONE-HOMATROPINE 5-1.5 MG/5ML PO SYRP: 5.0000 mL | ORAL_SOLUTION | Freq: Every evening | ORAL | Status: DC | PRN

## 2013-10-17 MED ORDER — METFORMIN HCL 500 MG PO TABS: 500.0000 mg | ORAL_TABLET | Freq: Two times a day (BID) | ORAL | Status: DC

## 2013-10-17 NOTE — Progress Notes (Signed)
   Subjective:    Patient ID: Brenda Sharp, female    DOB: 1981/01/30, 33 y.o.   MRN: [id number]  HPI  hypertension. She feels like a labetalol is not controlling her blood pressure very well. She has had a miscarriage since I last saw her.  Migraine HA - she has been having HA almost weekly for the past 6 weeks. She's not sure if it's related to the labetalol or not. The headache started around that time. Her OB/GYN gave her medication to use as needed and it has been somewhat helpful. If the combination of butalbital, Tylenol, caffeine.   She gets vomiting with her migraines and now ave pressure in her LUQ and have some rib pain as well. It has been there for almost 4 weeks. Worse when she is driving.  Had a bad cold about a month ago and now has had a cough x 6 weeks.  Her voice raspy.  Cough is productive in AM and makes her gag but by mid-day it is more dry. Now feels like she is choking with her cough.  She has noticed some wheezing Has used some albuterol in the past but doesn't have one right now. No asthma as a child. .    Review of Systems     Objective:   Physical Exam  Constitutional: She is oriented to person, place, and time. She appears well-developed and well-nourished.  HENT:  Head: Normocephalic and atraumatic.  Right Ear: External ear normal.  Left Ear: External ear normal.  Nose: Nose normal.  Mouth/Throat: Oropharynx is clear and moist.  TMs and canals are clear.  Petechia on posterior pharynx  Eyes: Conjunctivae and EOM are normal. Pupils are equal, round, and reactive to light.  Neck: Neck supple. No thyromegaly present.  Cardiovascular: Normal rate, regular rhythm and normal heart sounds.   Pulmonary/Chest: Effort normal and breath sounds normal. She has no wheezes.  Lymphadenopathy:    She has no cervical adenopathy.  Neurological: She is alert and oriented to person, place, and time.  Skin: Skin is warm and dry.  Psychiatric: She has a normal mood and affect.           Assessment & Plan:  HTN - not well controlled. Will increase the labetolol 200mg . F/U in 2 months.  Call sooner if she feels like it's aggravating her headaches.  Migraine HA.-Unclear what may be triggering her that she has had a cough for the last month. Certainly that could be aggravating her headaches. She is responding well to the generic Fioricet. It's possible the labetalol is triggering her headaches are typically beta blocker statin to help headaches. For some a little bit has to stop the medication yet. If the headaches get worse or not improving over the next few weeks on the increased dose of labetalol then please give me a call back. Do not need to bring to followup appointment.  Cough-will test for pertussis. I don't hear any wheezing on exam today but we'll go ahead and send her a prescription for albuterol since she has used this in the past and it is helpful for her. Also offered nighttime cough medication.will get CXR since cough > 1 mo and not improving.

## 2013-10-20 ENCOUNTER — Other Ambulatory Visit: Payer: Self-pay | Admitting: Family Medicine

## 2013-10-20 LAB — BORDETELLA PERTUSSIS PCR
B PARAPERTUSSIS, DNA: NOT DETECTED
B pertussis, DNA: NOT DETECTED

## 2013-10-20 MED ORDER — AMOXICILLIN-POT CLAVULANATE 875-125 MG PO TABS: 1.0000 | ORAL_TABLET | Freq: Two times a day (BID) | ORAL | Status: DC

## 2013-11-03 LAB — CULTURE, BORDETELLA PERTUSSIS

## 2013-12-26 ENCOUNTER — Other Ambulatory Visit: Payer: Self-pay | Admitting: *Deleted

## 2013-12-26 DIAGNOSIS — G43109 Migraine with aura, not intractable, without status migrainosus: Secondary | ICD-10-CM

## 2013-12-26 MED ORDER — DULOXETINE HCL 30 MG PO CPEP: 30.0000 mg | ORAL_CAPSULE | Freq: Every day | ORAL | Status: DC

## 2014-01-18 ENCOUNTER — Other Ambulatory Visit: Payer: Self-pay | Admitting: Family Medicine

## 2014-03-02 ENCOUNTER — Other Ambulatory Visit: Payer: Self-pay | Admitting: Family Medicine

## 2014-03-28 ENCOUNTER — Other Ambulatory Visit: Payer: Self-pay | Admitting: Family Medicine

## 2014-05-25 ENCOUNTER — Encounter: Payer: Self-pay | Admitting: Physician Assistant

## 2014-05-25 ENCOUNTER — Ambulatory Visit (INDEPENDENT_AMBULATORY_CARE_PROVIDER_SITE_OTHER): Payer: BC Managed Care – PPO | Admitting: Physician Assistant

## 2014-05-25 VITALS — BP 157/111 | HR 84 | Ht 63.0 in | Wt 253.0 lb

## 2014-05-25 DIAGNOSIS — I10 Essential (primary) hypertension: Secondary | ICD-10-CM | POA: Diagnosis not present

## 2014-05-25 DIAGNOSIS — R42 Dizziness and giddiness: Secondary | ICD-10-CM

## 2014-05-25 DIAGNOSIS — E034 Atrophy of thyroid (acquired): Secondary | ICD-10-CM | POA: Diagnosis not present

## 2014-05-25 DIAGNOSIS — E038 Other specified hypothyroidism: Secondary | ICD-10-CM

## 2014-05-25 LAB — COMPLETE METABOLIC PANEL WITH GFR
ALT: 35 U/L (ref 0–35)
AST: 40 U/L — AB (ref 0–37)
Albumin: 4.2 g/dL (ref 3.5–5.2)
Alkaline Phosphatase: 74 U/L (ref 39–117)
BILIRUBIN TOTAL: 0.3 mg/dL (ref 0.2–1.2)
BUN: 9 mg/dL (ref 6–23)
CO2: 26 mEq/L (ref 19–32)
CREATININE: 0.86 mg/dL (ref 0.50–1.10)
Calcium: 9.2 mg/dL (ref 8.4–10.5)
Chloride: 105 mEq/L (ref 96–112)
GFR, Est African American: >89 mL/min
GFR, Est Non African American: >89 mL/min
Glucose, Bld: 105 mg/dL — ABNORMAL HIGH (ref 70–99)
Potassium: 4.5 mEq/L (ref 3.5–5.3)
Sodium: 135 mEq/L (ref 135–145)
Total Protein: 6.5 g/dL (ref 6.0–8.3)

## 2014-05-25 LAB — TSH: TSH: 5.489 u[IU]/mL — ABNORMAL HIGH (ref 0.350–4.500)

## 2014-05-25 MED ORDER — LABETALOL HCL 300 MG PO TABS: 300.0000 mg | ORAL_TABLET | Freq: Two times a day (BID) | ORAL | Status: DC

## 2014-05-25 MED ORDER — MECLIZINE HCL 25 MG PO TABS: 25.0000 mg | ORAL_TABLET | Freq: Three times a day (TID) | ORAL | Status: DC | PRN

## 2014-05-25 NOTE — Progress Notes (Signed)
   Subjective:    Patient ID: Brenda Sharp, female    DOB: 1981-01-06, 33 y.o.   MRN: [id number]  HPI Patient is a 33 yo female who presents to the clinic with dizziness for last 6 weeks. She almost feels like she is going to get a migraine but never does. Not tried anything to make better. Driving and moving a lot makes worse. Turning head to left makes worse. A few weeks ago levothyroxine was increased to 119mcg due to elevated TSH. No fever, chills. She does feel nauseated off and on. She is trying to get pregnant and took femera for 4 days. She is due for her period November 1st.     Review of Systems  All other systems reviewed and are negative.      Objective:   Physical Exam  Constitutional: She is oriented to person, place, and time. She appears well-developed and well-nourished.  HENT:  Head: Normocephalic and atraumatic.  Right Ear: External ear normal.  Left Ear: External ear normal.  Nose: Nose normal.  Mouth/Throat: Oropharynx is clear and moist. No oropharyngeal exudate.  Eyes: Conjunctivae and EOM are normal. Pupils are equal, round, and reactive to light. Right eye exhibits no discharge. Left eye exhibits no discharge.  Neck: Normal range of motion. Neck supple.  Cardiovascular: Normal rate, regular rhythm and normal heart sounds.   Pulmonary/Chest: Effort normal and breath sounds normal. She has no wheezes.  Lymphadenopathy:    She has no cervical adenopathy.  Neurological: She is alert and oriented to person, place, and time. No cranial nerve deficit. Coordination normal.  dix-hallpike positive both sides.   Skin: Skin is dry.  Psychiatric: She has a normal mood and affect. Her behavior is normal.          Assessment & Plan:  HTN- trying to get pregnant increased labetalol to 300mg  bid. Recheck in 2 weeks.   Dizziness/BPV- dix hallpike was positive. Gave epley maneuvers. antivert given to use as needed since pregnancy B.  Certainly BP could be causing some  dizziness being so elevated. We have to get that under control. Will check thyroid since recently increased. Follow up with worsening symptoms.

## 2014-05-27 ENCOUNTER — Other Ambulatory Visit: Payer: Self-pay | Admitting: Physician Assistant

## 2014-05-27 MED ORDER — LEVOTHYROXINE SODIUM 88 MCG PO TABS: 88.0000 ug | ORAL_TABLET | Freq: Every day | ORAL | Status: DC

## 2014-05-28 MED ORDER — LEVOTHYROXINE SODIUM 112 MCG PO TABS: 112.0000 ug | ORAL_TABLET | Freq: Every day | ORAL | Status: DC

## 2014-05-28 NOTE — Addendum Note (Signed)
Addended by: Silverio Decamp on: 05/28/2014 04:56 PM   Modules accepted: Orders, Medications

## 2014-05-28 NOTE — Assessment & Plan Note (Signed)
TSH continues to be elevated on 100 mcg of levothyroxine. Increasing to 112 mcg, recheck TSH in 6 weeks.

## 2014-06-01 ENCOUNTER — Encounter: Payer: Self-pay | Admitting: Family Medicine

## 2014-06-01 ENCOUNTER — Ambulatory Visit (INDEPENDENT_AMBULATORY_CARE_PROVIDER_SITE_OTHER): Payer: BC Managed Care – PPO | Admitting: Family Medicine

## 2014-06-01 VITALS — BP 158/103 | HR 96 | Resp 18 | Wt 252.0 lb

## 2014-06-01 DIAGNOSIS — I10 Essential (primary) hypertension: Secondary | ICD-10-CM

## 2014-06-01 DIAGNOSIS — H8111 Benign paroxysmal vertigo, right ear: Secondary | ICD-10-CM

## 2014-06-01 DIAGNOSIS — M1000 Idiopathic gout, unspecified site: Secondary | ICD-10-CM | POA: Diagnosis not present

## 2014-06-01 MED ORDER — NIFEDIPINE ER OSMOTIC RELEASE 30 MG PO TB24: 30.0000 mg | ORAL_TABLET | Freq: Every day | ORAL | Status: DC

## 2014-06-01 MED ORDER — COLCHICINE 0.6 MG PO TABS: ORAL_TABLET | ORAL | Status: DC

## 2014-06-01 NOTE — Progress Notes (Signed)
   Subjective:    Patient ID: Brenda Sharp, female    DOB: 06-17-1981, 33 y.o.   MRN: [id number]  Dizziness   F/U dizziness. Says when makes quick movements she feels bac.  Had had sxs x 6 weeks. Says get similar sxs with her migraine but hasn't had a migraine with this this time.  Get pain in her neck when turns left. Will feel off balance and almost get tunnel vision.  No hearing loss or ringing in the ear.  Exercise ane meclizine not helping. The femara made her sxs worse.    Gout  - she is still having frequent flares.  Getting flaes every couple of weeks.  We had to switch her to probenicid bc of her trying to get pregnant. She i shaving to use her colchin weekly Needs a refill.   F/U HTN - she saw my partners about a week ago. They increased her labetalol to 300 mg. She has been taking it consistently. No CP or SOB.  + HA.   Review of Systems  Neurological: Positive for dizziness.       Objective:   Physical Exam  Constitutional: She is oriented to person, place, and time. She appears well-developed and well-nourished.  HENT:  Head: Normocephalic and atraumatic.  Right Ear: External ear normal.  Left Ear: External ear normal.  Nose: Nose normal.  Mouth/Throat: Oropharynx is clear and moist.  TMs and canals are clear.   Eyes: Conjunctivae and EOM are normal. Pupils are equal, round, and reactive to light.  Neck: Neck supple. No thyromegaly present.  Cardiovascular: Normal rate, regular rhythm and normal heart sounds.   Pulmonary/Chest: Effort normal and breath sounds normal. She has no wheezes.  Lymphadenopathy:    She has no cervical adenopathy.  Neurological: She is alert and oriented to person, place, and time.  Positive Diks-Hallpike manuver to thr right. It recreated her sxs and caused nystagmus.   Skin: Skin is warm and dry.  Psychiatric: She has a normal mood and affect.          Assessment & Plan:  BPV - she does have a positive Dix-Hallpike maneuver to the  right. Since she is not getting better with home therapy and meclizine recommend referral to physical therapy for further treatment. Also discussed the importance of getting her blood pressure under control as this could be contributing to her dizziness symptoms in general.  HTN - uncontrolled.  We'll add nifedipine which is typically incidental to be safe in pregnancy since she is trying to actively get pregnant. I would like to see her back in about 3-4 weeks to make sure that she is improving and her blood pressures coming back down.  Gout - will incresae probenicid to 2 gram per day.  She is unable to take LP on all or Uloric because she is trying to get pregnant.

## 2014-06-04 NOTE — Addendum Note (Signed)
Addended by: Beatrice Lecher D on: 06/04/2014 05:02 PM   Modules accepted: Orders

## 2014-06-08 ENCOUNTER — Encounter: Payer: Self-pay | Admitting: Family Medicine

## 2014-06-09 LAB — TSH: TSH: 2.66 (ref 0.41–5.90)

## 2014-06-11 ENCOUNTER — Encounter: Payer: Self-pay | Admitting: Family Medicine

## 2014-06-11 ENCOUNTER — Ambulatory Visit (INDEPENDENT_AMBULATORY_CARE_PROVIDER_SITE_OTHER): Payer: BC Managed Care – PPO | Admitting: Family Medicine

## 2014-06-11 VITALS — BP 133/94 | HR 100 | Ht 63.0 in | Wt 250.0 lb

## 2014-06-11 DIAGNOSIS — H9202 Otalgia, left ear: Secondary | ICD-10-CM

## 2014-06-11 DIAGNOSIS — H8111 Benign paroxysmal vertigo, right ear: Secondary | ICD-10-CM | POA: Diagnosis not present

## 2014-06-11 DIAGNOSIS — I10 Essential (primary) hypertension: Secondary | ICD-10-CM

## 2014-06-11 MED ORDER — PREDNISONE 20 MG PO TABS: ORAL_TABLET | ORAL | Status: DC

## 2014-06-11 NOTE — Progress Notes (Signed)
   Subjective:    Patient ID: Brenda Sharp, female    DOB: 04/21/1981, 33 y.o.   MRN: [id number]  HPI Hypertension- Pt denies chest pain, SOB, dizziness, or heart palpitations.  Taking meds as directed w/o problems.  Denies medication side effects.  We added nifedipine a week ago. She has lost another lb.   She has felt like she is getting a cold with some runny nose, Left ear pressure/pain, and ST and bad HA for about 2 days.  No fever, chills, sweat. No nasal congestion.   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 It's not as intense as it was. She's now able to drive but it has still been persistent. She unfortunately did not receive a call from the rehabilitation facility.  Review of Systems     Objective:   Physical Exam  Constitutional: She is oriented to person, place, and time. She appears well-developed and well-nourished.  HENT:  Head: Normocephalic and atraumatic.  Cardiovascular: Normal rate, regular rhythm and normal heart sounds.   Pulmonary/Chest: Effort normal and breath sounds normal.  Neurological: She is alert and oriented to person, place, and time.  Skin: Skin is warm and dry.  Psychiatric: She has a normal mood and affect. Her behavior is normal.          Assessment & Plan:  HTN - much improved. Diastolic still not at goal. I would really like to see her work on losing about 5 pounds between now and when I see her back instead of increasing her medication. If I can make a huge difference in her blood pressure. Please see discussion below.  Obesity/BMI 44 - stressed the importance of exercise and weight loss. Will make such a big difference in her blood pressure etc. She agrees. Recommended the Smart phone application called my fitness PAL.this would also help with her fertility if she was able to lose a significant amount of weight.  Left otalgia - TM is bulging.  Because of the vertigo, headaches and bulging tympanic membrane and mucoid and treat her with 5 days of  prednisone. If she's not improving then please let me know.  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 Unfortunately she had not been contacted. We, she was in the office today regarding appointment for November 24. If she gets better in the interim can CANCEL the appointment. She is also welcome to call the phone number sooner rather than later to see if they have any cancellations.

## 2014-06-19 LAB — PROGESTERONE: PROGESTERONE: 1.63

## 2014-06-30 ENCOUNTER — Ambulatory Visit: Payer: BC Managed Care – PPO | Admitting: Rehabilitative and Restorative Service Providers"

## 2014-07-09 ENCOUNTER — Ambulatory Visit (INDEPENDENT_AMBULATORY_CARE_PROVIDER_SITE_OTHER): Payer: BC Managed Care – PPO | Admitting: Family Medicine

## 2014-07-09 ENCOUNTER — Encounter: Payer: Self-pay | Admitting: Family Medicine

## 2014-07-09 VITALS — BP 138/86 | HR 98 | Wt 249.0 lb

## 2014-07-09 DIAGNOSIS — E039 Hypothyroidism, unspecified: Secondary | ICD-10-CM

## 2014-07-09 DIAGNOSIS — I10 Essential (primary) hypertension: Secondary | ICD-10-CM

## 2014-07-09 DIAGNOSIS — R42 Dizziness and giddiness: Secondary | ICD-10-CM

## 2014-07-09 DIAGNOSIS — F329 Major depressive disorder, single episode, unspecified: Secondary | ICD-10-CM | POA: Insufficient documentation

## 2014-07-09 DIAGNOSIS — F32A Depression, unspecified: Secondary | ICD-10-CM

## 2014-07-09 MED ORDER — DULOXETINE HCL 60 MG PO CPEP: 60.0000 mg | ORAL_CAPSULE | Freq: Every day | ORAL | Status: DC

## 2014-07-09 MED ORDER — DIAZEPAM 2 MG PO TABS: 2.0000 mg | ORAL_TABLET | Freq: Three times a day (TID) | ORAL | Status: DC | PRN

## 2014-07-09 NOTE — Progress Notes (Signed)
   Subjective:    Patient ID: Brenda Sharp, female    DOB: November 20, 1980, 33 y.o.   MRN: [id number]  HPI BBPV - Says her vertigo is not better.  Says the meclizine make sher feel sleepy and not really helping. Wants to know if something else.  She starts vestibular rehab next Monday. Making it hard to work and drive. Some days she's had to go home early from work.   Hypertension- Pt denies chest pain, SOB, dizziness, or heart palpitations.  Taking meds as directed w/o problems.  Denies medication side effects.  Home BPs 120s/80s.  Has lost 2 lbs.    She is feeling more down and wants ot know if can increase her Cymbalta.  She used to be on 60mg . She feels like the vetigo is really getting her down.    Review of Systems     Objective:   Physical Exam  Constitutional: She is oriented to person, place, and time. She appears well-developed and well-nourished.  HENT:  Head: Normocephalic and atraumatic.  Right Ear: External ear normal.  Left Ear: External ear normal.  Nose: Nose normal.  Mouth/Throat: Oropharynx is clear and moist.  TMs and canals are clar.   Cardiovascular: Normal rate, regular rhythm and normal heart sounds.   Pulmonary/Chest: Effort normal and breath sounds normal.  Neurological: She is alert and oriented to person, place, and time.  Skin: Skin is warm and dry.  Psychiatric: She has a normal mood and affect. Her behavior is normal.          Assessment & Plan:  HTN- much improved. Home BPs sounds great!    BBPV - starting vestibular rehabilitation on Monday. If she's not improving pretty quickly with this then please let me know and I'll refer her to neurology. She does note that the vertigo seemed to start after she increased her thyroid medication. Next  Hypothyroidism-we recently increased her dose 212 g. Due to recheck levels. Lab slip provided.  Depression - increase Cymbalta to 60mg .  F/U in 2 months.

## 2014-07-13 ENCOUNTER — Encounter: Payer: Self-pay | Admitting: Physical Therapy

## 2014-07-13 ENCOUNTER — Ambulatory Visit: Payer: BC Managed Care – PPO | Attending: Family Medicine | Admitting: Physical Therapy

## 2014-07-13 DIAGNOSIS — R42 Dizziness and giddiness: Secondary | ICD-10-CM | POA: Diagnosis present

## 2014-07-13 NOTE — Patient Instructions (Signed)
Gaze Stabilization: Tip Card 1.Target must remain in focus, not blurry, and appear stationary while head is in motion. 2.Perform exercises with small head movements (45 to either side of midline). 3.Increase speed of head motion so long as target is in focus. 4.If you wear eyeglasses, be sure you can see target through lens (therapist will give specific instructions for bifocal / progressive lenses). 5.These exercises may provoke dizziness or nausea. Work through these symptoms. If too dizzy, slow head movement slightly. Rest between each exercise. 6.Exercises demand concentration; avoid distractions. 7.For safety, perform standing exercises close to a counter, wall, corner, or next to someone.  Copyright  VHI. All rights reserved.  Gaze Stabilization: Standing Feet Apart   Feet shoulder width apart, keeping eyes on target on wall _6-8___ feet away, tilt head down 15-30 and move head side to side for _60___ seconds. Repeat while moving head up and down for _60___ seconds. Do __3-5__ sessions per day. Repeat using target on pattern background.  Copyright  VHI. All rights reserved.  Gaze Stabilization: Standing Feet Apart   Feet shoulder width apart, keeping eyes on target on wall ____ feet away, tilt head down 15-30 and move head side to side for ____ seconds. Repeat while moving head up and down for ____ seconds. Do ____ sessions per day. Repeat using target on pattern background.  Copyright  VHI. All rights reserved.

## 2014-07-13 NOTE — Therapy (Signed)
[facility] [address] [place], [place], [address] Phone: [phone]   Fax:  [phone]  Physical Therapy Evaluation  Patient Details  Name: Brenda Sharp MRN: [id number] Date of Birth: 1980-11-26  Encounter Date: 07/13/2014      PT End of Session - 07/13/14 1650    Visit Number 1   Number of Visits 8   Date for PT Re-Evaluation 08/12/14      Past Medical History  Diagnosis Date  . Hypertension   . Headache(784.0)   . Hypothyroidism   . Kidney stone   . PCOS (polycystic ovarian syndrome)     Past Surgical History  Procedure Laterality Date  . Tonsillectomy  1990  . Wisdom tooth extraction  2006  . Lithotripsy      There were no vitals taken for this visit.  Visit Diagnosis:  Dizziness and giddiness - Plan: PT plan of care cert/re-cert      Subjective Assessment - 07/13/14 1629    Symptoms Pt. reports constant vertigo since Sept. 2015; rates intensity 4/10 at time of evaluation: worse when driving; pt. states MD diagnosed vertigo as BPPV and treated with Epley's and gave Brandt-Daroff HEP    Patient Stated Goals resolve the vertigo          [facility] PT Assessment - 07/13/14 1631    Assessment   Medical Diagnosis Vertigo: BPPV   Onset Date --  mid-Sept. 2015            PT Education - 07/13/14 1650    Education provided Yes   Education Details gaze stabilization in standing for HEP   Person(s) Educated Patient   Methods Explanation;Demonstration   Comprehension Verbalized understanding;Returned demonstration          PT Short Term Goals - 07/13/14 1700    PT SHORT TERM GOAL #1   Title same as LTG's          PT Long Term Goals - 07/13/14 1700    PT LONG TERM GOAL #1   Title Pt. will have a 2 line difference with dynamic visual acuity test to indicate improved VOR function.   Baseline 08-12-14   Time 4   Period Weeks   Status New   PT LONG TERM GOAL #2   Title Improve FOTO by 10 points  for pt's primary [place] score to demo improved pt. satisfaction   Baseline 08-12-14   Time 4   Period Weeks   Status New   PT LONG TERM GOAL #3   Title Perform SOT and establish goal as appropriate   Baseline 08-12-14   Time 4   Period Weeks   Status New          Plan - 07/13/14 1651    Clinical Impression Statement Pt.'s symptoms are not consistent with BPPV at this time; appears to be related to migrainous-induced vertigo as pt. reports vertigo is constant and c/o headaches frequently, including having migraines  oculomotor abnormalities are noted contributing to visual  dysfunction contributing to vertigo; pt. may benefit from neurology referral for medical management of migraines/headaches which subsequently may help to reduce c/o vertigo;    Pt will benefit from skilled therapeutic intervention in order to improve on the following deficits Decreased balance;Decreased coordination;Other (comment);Difficulty walking;Decreased activity tolerance  vertigo; decr. vestibular function   Rehab Potential Good   PT Frequency 2x / week   PT Duration 4 weeks   PT Treatment/Interventions Therapeutic activities;Patient/family education;Therapeutic exercise;Balance training;Gait training;Neuromuscular re-education;Other (comment)  vestibular rehab; Sensory organization test   PT Next Visit Plan do SOT; check gaze stabilization   PT Home Exercise Plan gaze stabilization   Recommended Other Services neurology consult for medical management of migraines   Consulted and Agree with Plan of Care Patient               Vestibular Assessment - 07/13/14 1544    General Observation Pt. is ambulating independently without assist. 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 able to perform only approx. 5 secs   VOR 2 Head and Object (x 2 viewing) c/o vertigo with testing   VOR Cancellation Corrective saccades   Static line 10   Dynamic line 7   3 line difference (normal = no > 2 line difference);    Dix-Hallpike Dix-Hallpike Right;Dix-Hallpike Left  both negative for nystagmus and c/o vertigo in test position   Sidelying Test Sidelying Right;Sidelying Left  both negative for nystagmus and vertigo in test position   Up from Right Hallpike Mild dizziness   Up from Left Hallpike Mild dizziness                       Problem List Patient Active Problem List   Diagnosis Date Noted  . Depression 07/09/2014  . Severe obesity (BMI >= 40) 06/11/2014  . Hypothyroidism 08/21/2013  . Fatty liver 10/18/2012  . Essential hypertension, benign 09/27/2012  . Other and unspecified hyperlipidemia 12/01/2011  . Obesity 10/11/2011  . Insulin resistance 10/11/2011  . Gout 06/08/2011   Guido Sander, [place] [address], [place], [place] [address] [phone]  Alda Lea 07/13/2014, 5:12 PM

## 2014-07-21 ENCOUNTER — Ambulatory Visit: Payer: BC Managed Care – PPO | Admitting: Physical Therapy

## 2014-07-21 ENCOUNTER — Encounter: Payer: Self-pay | Admitting: Physical Therapy

## 2014-07-21 DIAGNOSIS — R42 Dizziness and giddiness: Secondary | ICD-10-CM | POA: Diagnosis not present

## 2014-07-21 NOTE — Therapy (Signed)
[facility] [address] [place], [place], [address] Phone: [phone]   Fax:  [phone]  Physical Therapy Treatment  Patient Details  Name: Brenda Sharp MRN: [id number] Date of Birth: 10-04-1980  Encounter Date: 07/21/2014      PT End of Session - 07/21/14 2252    Visit Number 2   Number of Visits 8   Date for PT Re-Evaluation 08/12/14      Past Medical History  Diagnosis Date  . Hypertension   . Headache(784.0)   . Hypothyroidism   . Kidney stone   . PCOS (polycystic ovarian syndrome)     Past Surgical History  Procedure Laterality Date  . Tonsillectomy  1990  . Wisdom tooth extraction  2006  . Lithotripsy      There were no vitals taken for this visit.  Visit Diagnosis:  Dizziness and giddiness      Subjective Assessment - 07/21/14 2247    Symptoms pt. rates vertigo  5/10 today - reports dizziness and headache about the same as it was last week   Currently in Pain? Yes   Pain Score 5    Pain Location Head   Pain Descriptors / Indicators Aching   Pain Type Chronic pain   Pain Onset More than a month ago   Pain Frequency Several days a week              PT Education - 07/21/14 2251    Education provided Yes   Education Details balance on foam with EO/EC   Person(s) Educated Patient   Methods Explanation;Demonstration;Handout   Comprehension Verbalized understanding;Returned demonstration              Plan - 07/21/14 2252    Clinical Impression Statement gaze stabilization improved today with only a 1 line difference burt pt demo decr. vestibular function per SOT and c/o vertigo after SOT completed; sx may be due to migrqaines   Pt will benefit from skilled therapeutic intervention in order to improve on the following deficits Decreased balance;Decreased coordination;Other (comment);Difficulty walking;Decreased activity tolerance   PT Frequency 2x / week   PT Duration 4 weeks   PT  Treatment/Interventions Therapeutic activities;Patient/family education;Therapeutic exercise;Balance training;Gait training;Neuromuscular re-education;Other (comment)   PT Next Visit Plan continue vestibular exercises   PT Home Exercise Plan balance on foam   Consulted and Agree with Plan of Care Patient               Vestibular Assessment - 07/21/14 2248    Static line 9   Dynamic line 8   Balancemaster Sensory organization test   Balancemaster Comment SOT score 58/100 with N= 70/100; decreased vest. as it is 2/100 with N= 55/100; visual WNL's and somatosensory 53/100 wtih N= 85/100     Pt. Performed vestibular exercises including balance on foam with EO/EC and with head turns with CGA - feet apart and feet together                   Problem List Patient Active Problem List   Diagnosis Date Noted  . Depression 07/09/2014  . Severe obesity (BMI >= 40) 06/11/2014  . Hypothyroidism 08/21/2013  . Fatty liver 10/18/2012  . Essential hypertension, benign 09/27/2012  . Other and unspecified hyperlipidemia 12/01/2011  . Obesity 10/11/2011  . Insulin resistance 10/11/2011  . Gout 06/08/2011    Alda Lea 07/21/2014, 10:55 PM    [facility] [address]  [place], [place] [address] [phone]

## 2014-07-21 NOTE — Patient Instructions (Signed)
Feet Apart (Compliant Surface) Varied Arm Positions - Eyes Open   With eyes open, standing on compliant surface: ________, feet shoulder width apart and arms out, look at a stationary object. Hold ____ seconds. Repeat ____ times per session. Do ____ sessions per day.  Copyright  VHI. All rights reserved.  Feet Together (Compliant Surface) Arm Motion - Eyes Open   With eyes open, standing on compliant surface:________, feet together, move arms up and down: to front. Repeat ____ times per session. Do ____ sessions per day.  Copyright  VHI. All rights reserved.  Feet Apart (Compliant Surface) Varied Arm Positions - Eyes Closed   Stand on compliant surface: ________ with feet shoulder width apart and arms out. Close eyes and visualize upright position. Hold____ seconds. Repeat ____ times per session. Do ____ sessions per day.  Copyright  VHI. All rights reserved.  Feet Together (Compliant Surface) Arm Motion - Eyes Closed   Stand on compliant surface: ________ with feet together. Close eyes and move arms up and down: to front. Repeat ____ times per session. Do ____ sessions per day.  Copyright  VHI. All rights reserved.

## 2014-07-27 ENCOUNTER — Telehealth: Payer: Self-pay

## 2014-07-27 DIAGNOSIS — H8110 Benign paroxysmal vertigo, unspecified ear: Secondary | ICD-10-CM

## 2014-07-27 MED ORDER — DIAZEPAM 2 MG PO TABS: 2.0000 mg | ORAL_TABLET | Freq: Two times a day (BID) | ORAL | Status: DC | PRN

## 2014-07-27 NOTE — Telephone Encounter (Signed)
[place] for referral to Neuorlogy. Ok for 60 tabs of the valium.

## 2014-07-27 NOTE — Telephone Encounter (Signed)
Terin called and left a message stating the PT is not helping with her vertigo. She would like a referral to neurology. Who would you like to sent her to? I will be happy to place referral. She also stated she would like a refill on the Valium. The directions would need to be changed otherwise it would be to early. Please advise.

## 2014-07-27 NOTE — Telephone Encounter (Signed)
Referral and medication sent.

## 2014-07-28 ENCOUNTER — Encounter: Payer: Self-pay | Admitting: Physical Therapy

## 2014-07-28 ENCOUNTER — Ambulatory Visit: Payer: BC Managed Care – PPO | Admitting: Physical Therapy

## 2014-07-28 DIAGNOSIS — R42 Dizziness and giddiness: Secondary | ICD-10-CM | POA: Diagnosis not present

## 2014-07-28 NOTE — Therapy (Signed)
[facility] [address] [place], [place], [address] Phone: [phone]   Fax:  [phone]  Physical Therapy Treatment  Patient Details  Name: Brenda Sharp MRN: [id number] Date of Birth: Nov 19, 1980  Encounter Date: 07/28/2014      PT End of Session - 07/28/14 2135    Visit Number 3   Number of Visits 8   Date for PT Re-Evaluation 08/12/14   PT Start Time 1450   PT Stop Time 1533   PT Time Calculation (min) 43 min      Past Medical History  Diagnosis Date  . Hypertension   . Headache(784.0)   . Hypothyroidism   . Kidney stone   . PCOS (polycystic ovarian syndrome)     Past Surgical History  Procedure Laterality Date  . Tonsillectomy  1990  . Wisdom tooth extraction  2006  . Lithotripsy      There were no vitals taken for this visit.  Visit Diagnosis:  Dizziness and giddiness      Subjective Assessment - 07/28/14 2129    Symptoms Pt. rates vertigo 7/10 today with a headache at present time; states she has a neurology consult scheduled in early Feb. with Dr. Tomi Likens   Patient Stated Goals resolve the vertigo   Currently in Pain? Yes   Pain Score 6    Pain Location Head   Pain Descriptors / Indicators Aching   Pain Type Chronic pain   Pain Onset More than a month ago   Multiple Pain Sites No                    [facility] Adult PT Treatment/Exercise - 07/28/14 0001    High Level Balance   High Level Balance Comments Pt. performed vestibular exercises including standing on foam with EO and EC and  with head turns horizontal and vertical          Vestibular Treatment/Exercise - 07/28/14 2132    Gaze Exercises X1 Viewing Horizontal;X1 Viewing Vertical  20 secs in standing   Foot Position bil. stance   Foot Position bil. stance    Neuro Re-ed:  Marching on incline with head turns horizontal and vertical; alternate stepping on incline and decline x 10 reps each LE with min assist;  standing making circles with ball clockwise and counterclockwise 10 reps each direction; standing trunk rotation alternating x 10 reps: ambulating reading cards right and left sides 50' x 2 reps Rockerboard with UE assist prn with head turns and standing with EC with minimal head movement with CGA         PT Education - 07/28/14 2135    Education Details reviewed HEP   Person(s) Educated Patient   Methods Explanation;Demonstration   Comprehension Verbalized understanding;Returned demonstration          PT Short Term Goals - 07/13/14 1700    PT SHORT TERM GOAL #1   Title same as LTG's           PT Long Term Goals - 07/13/14 1700    PT LONG TERM GOAL #1   Title Pt. will have a 2 line difference with dynamic visual acuity test to indicate improved VOR function.   Baseline 08-12-14   Time 4   Period Weeks   Status New   PT LONG TERM GOAL #2   Title Improve FOTO by 10 points for pt's primary [place] score to demo improved pt. satisfaction   Baseline 08-12-14   Time  4   Period Weeks   Status New   PT LONG TERM GOAL #3   Title Perform SOT and establish goal as appropriate   Baseline 08-12-14   Time 4   Period Weeks   Status New               Plan - 07/28/14 2136    Clinical Impression Statement pt.'s symptoms appear to be related to migrainous vertigo - vertigo accompanied by headache; pt. has decr. vestibular input in maintaining balance by LOB with EC activities   Pt will benefit from skilled therapeutic intervention in order to improve on the following deficits Decreased balance;Decreased coordination;Other (comment);Difficulty walking;Decreased activity tolerance   Rehab Potential Good   PT Frequency 2x / week   PT Duration 4 weeks   PT Treatment/Interventions Therapeutic activities;Patient/family education;Therapeutic exercise;Balance training;Gait training;Neuromuscular re-education;Other (comment)   PT Next Visit Plan re-check SOT; place on hold til after pt. sees  neurologist in early Feb.   Consulted and Agree with Plan of Care Patient        Problem List Patient Active Problem List   Diagnosis Date Noted  . Depression 07/09/2014  . Severe obesity (BMI >= 40) 06/11/2014  . Hypothyroidism 08/21/2013  . Fatty liver 10/18/2012  . Essential hypertension, benign 09/27/2012  . Other and unspecified hyperlipidemia 12/01/2011  . Obesity 10/11/2011  . Insulin resistance 10/11/2011  . Gout 06/08/2011    DildayJenness Corner, PT 07/28/2014, 9:40 PM  [facility] [address] [place], [place], [address] Phone: [phone]   Fax:  [phone]

## 2014-08-03 ENCOUNTER — Encounter: Payer: BC Managed Care – PPO | Admitting: Physical Therapy

## 2014-08-25 ENCOUNTER — Ambulatory Visit: Payer: BC Managed Care – PPO | Attending: Family Medicine | Admitting: Physical Therapy

## 2014-08-25 DIAGNOSIS — R42 Dizziness and giddiness: Secondary | ICD-10-CM | POA: Diagnosis present

## 2014-08-26 ENCOUNTER — Encounter: Payer: Self-pay | Admitting: Physical Therapy

## 2014-08-26 NOTE — Therapy (Signed)
[facility] [address] [place], [place], [address] Phone: [phone]   Fax:  [phone]  Physical Therapy Treatment  Patient Details  Name: Brenda Sharp MRN: [id number] Date of Birth: Aug 30, 1980 Referring Provider:  Hali Marry, *  Encounter Date: 08/25/2014      PT End of Session - 08/26/14 1015    Visit Number 4   Number of Visits 8   Date for PT Re-Evaluation 09/11/14   Authorization Type BCBS   PT Start Time 1535   PT Stop Time 1610   PT Time Calculation (min) 35 min      Past Medical History  Diagnosis Date  . Hypertension   . Headache(784.0)   . Hypothyroidism   . Kidney stone   . PCOS (polycystic ovarian syndrome)     Past Surgical History  Procedure Laterality Date  . Tonsillectomy  1990  . Wisdom tooth extraction  2006  . Lithotripsy      There were no vitals taken for this visit.  Visit Diagnosis:  No diagnosis found.      Subjective Assessment - 08/26/14 1010    Symptoms Pt. states vertigo is not any better, rates it 7/10 intensity;  does report some improvement in balance during gait; has appt. with Dr. Tomi Likens 09-10-14; thinks vertigo is realted to migraines/headaches;  now reports problem with head tremors   Patient Stated Goals resolve the vertigo   Pain Score 6    Pain Location Head   Pain Orientation Posterior;Left   Pain Descriptors / Indicators Headache   Pain Type Chronic pain   Pain Onset More than a month ago     Self Care;  Pt. Now c/o increased head tremors/movement toward left side (dystonia??) which is somewhat involuntary; states she is now Flexing UE's and hold and in a fisted position and placing under her chin to stabilize her head in order to decrease the dizziness.  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  Pt.  Was instructed in HEP consisting of ulnar nerve stretches; Also discussed possible trial of [place] of soft cervical collar to stabilize head during driving, as she reports driving is becoming more  Difficult due to  Incr. Vertigo without her head stabilized.  Symptoms are not consistent with true vestibular system dysfunction etiology - and no signs of BPPV as was pt.'s referring diagnosis.   Recommend pt. To see neurologist, Dr. Tomi Likens, as scheduled on 09-10-14; will place pt. On hold til after this appt. And will cont. If so  Ordered by Dr. Tomi Likens.  Pt. in agreement with this plan.                       PT Education - 08/26/14 1013    Education provided Yes   Education Details pt. instructed in stretches for L ulnar nerve due to c/o tingling in LUE due to compression due to pt. holding LUE flexed to stabilize head;  also discussed use of soft cervical collar dduring driving to stabilize head   Person(s) Educated Patient   Methods Explanation   Comprehension Verbalized understanding;Returned demonstration          PT Short Term Goals - 07/13/14 1700    PT SHORT TERM GOAL #1   Title same as LTG's  PT Long Term Goals - 07/13/14 1700    PT LONG TERM GOAL #1   Title Pt. will have a 2 line difference with dynamic visual acuity test to indicate improved VOR function.   Baseline 08-12-14   Time 4   Period Weeks   Status New   PT LONG TERM GOAL #2   Title Improve FOTO by 10 points for pt's primary [place] score to demo improved pt. satisfaction   Baseline 08-12-14   Time 4   Period Weeks   Status New   PT LONG TERM GOAL #3   Title Perform SOT and establish goal as appropriate   Baseline 08-12-14   Time 4   Period Weeks   Status New               Problem List Patient Active Problem List   Diagnosis Date Noted  . Depression 07/09/2014  . Severe obesity (BMI >= 40) 06/11/2014  . Hypothyroidism 08/21/2013  . Fatty liver 10/18/2012  . Essential  hypertension, benign 09/27/2012  . Other and unspecified hyperlipidemia 12/01/2011  . Obesity 10/11/2011  . Insulin resistance 10/11/2011  . Gout 06/08/2011    DildayJenness Corner, PT 08/26/2014, 10:17 AM  [facility] [address] [place], [place], [address] Phone: [phone]   Fax:  [phone]

## 2014-09-02 ENCOUNTER — Other Ambulatory Visit: Payer: Self-pay | Admitting: Family Medicine

## 2014-09-03 NOTE — Telephone Encounter (Signed)
Does this patient have chronic vertigo? Please advise.

## 2014-09-10 ENCOUNTER — Encounter: Payer: Self-pay | Admitting: Neurology

## 2014-09-10 ENCOUNTER — Ambulatory Visit (INDEPENDENT_AMBULATORY_CARE_PROVIDER_SITE_OTHER): Payer: BC Managed Care – PPO | Admitting: Neurology

## 2014-09-10 ENCOUNTER — Encounter: Payer: Self-pay | Admitting: *Deleted

## 2014-09-10 VITALS — BP 140/100 | HR 84 | Temp 98.5°F | Resp 20 | Ht 63.0 in | Wt 250.3 lb

## 2014-09-10 DIAGNOSIS — G43809 Other migraine, not intractable, without status migrainosus: Secondary | ICD-10-CM

## 2014-09-10 DIAGNOSIS — G43109 Migraine with aura, not intractable, without status migrainosus: Secondary | ICD-10-CM

## 2014-09-10 DIAGNOSIS — G43901 Migraine, unspecified, not intractable, with status migrainosus: Secondary | ICD-10-CM

## 2014-09-10 DIAGNOSIS — G43801 Other migraine, not intractable, with status migrainosus: Secondary | ICD-10-CM

## 2014-09-10 MED ORDER — TOPIRAMATE 25 MG PO TABS: ORAL_TABLET | ORAL | Status: DC

## 2014-09-10 NOTE — Progress Notes (Addendum)
NEUROLOGY CONSULTATION NOTE  CASANDRA Sharp MRN: [id number] DOB: 1981-07-17  Referring provider: Dr. Madilyn Fireman Primary care provider: Dr. Madilyn Fireman  Reason for consult:  Vertigo, migraine  HISTORY OF PRESENT ILLNESS: Brenda Sharp is a 34 year old right-handed woman with hypothyroidism, hypertension, and depression who presents for dizziness.  Records and labs reviewed.  She is accompanied by her father who provides some history.  She reports that she was diagnosed with migraine at 23 weeks old, which at first they thought were seizures.  She was worked up at [facility], where she was given this diagnosis.  Her headaches are located on the right Sharp and back of the head.  It is a stabbing and pressure quality.  It is 10/10 intensity.  It lasts 16 to 18 hours.  It occurs approximately twice a year.  It is associated with vertigo (spinning towards the left), nausea, vomiting, photophobia, phonophobia.  Previously, she had visual auras of blue halos, but not recently.  She occasionally takes Tylenol.  She takes Cymbalta 60mg .  Previous abortive medication included Imitrex (cardiac arrest), stadol, phenergan, demerol, Tylenol, Advil, Aleve and Excedrin.  Prior preventative medication includes topiramate, tegretol, depakote, Inderal, zoloft, HTCZ.  Most of these medications she hasn't taken for many years.  Since mid-September, she has had persistent vertigo associated with the migraine headache.  However, the headache is less intense (about 5/10) and not associated with vomiting.  The vertigo is better when laying down.  She finds that her head is involuntarily moving towards the left in the direction of the spinning.  There is no associated double vision, tinnitus, hearing loss, slurred speech, focal numbness or weakness.  She has not had problems with gait and denies falls.  She works as a Scientist, research (physical sciences) and has been out of work off and on for 3 1/2 weeks.  She tried meclizine which was ineffective and made  her sleepy.  She went to vestibular rehab, which wasn't effective.  She has been on prednisone for gout, which did not effect the headaches and vertigo.  Neck feels sore.  TSH from 05/25/14 was 5.489.  PAST MEDICAL HISTORY: Past Medical History  Diagnosis Date  . Hypertension   . Headache(784.0)   . Hypothyroidism   . Kidney stone   . PCOS (polycystic ovarian syndrome)     PAST SURGICAL HISTORY: Past Surgical History  Procedure Laterality Date  . Tonsillectomy  1990  . Wisdom tooth extraction  2006  . Lithotripsy      MEDICATIONS: Current Outpatient Prescriptions on File Prior to Visit  Medication Sig Dispense Refill  . acetaminophen (TYLENOL) 500 MG tablet Take 1,000 mg by mouth every 6 (six) hours as needed for moderate pain.    Marland Kitchen colchicine (COLCRYS) 0.6 MG tablet TAKE 1 TABLET BY MOUTH DAILY 30 tablet 6  . diazepam (VALIUM) 2 MG tablet TAKE 1 TABLET BY MOUTH TWICE DAILY AS NEEDED FOR VERTIGO 60 tablet 1  . DULoxetine (CYMBALTA) 60 MG capsule Take 1 capsule (60 mg total) by mouth daily. 90 capsule 1  . labetalol (NORMODYNE) 300 MG tablet Take 1 tablet (300 mg total) by mouth 2 (two) times daily. 60 tablet 1  . levothyroxine (SYNTHROID, LEVOTHROID) 112 MCG tablet Take 1 tablet (112 mcg total) by mouth daily. 30 tablet 3  . metFORMIN (GLUCOPHAGE) 500 MG tablet Take 1 tablet (500 mg total) by mouth 2 (two) times daily with a meal. 60 tablet 3  . NIFEdipine (PROCARDIA-XL/ADALAT-CC/NIFEDICAL-XL) 30 MG 24 hr tablet  Take 1 tablet (30 mg total) by mouth daily. 30 tablet 3  . probenecid (BENEMID) 500 MG tablet Take 1 tablet (500 mg total) by mouth daily. 30 tablet 6   No current facility-administered medications on file prior to visit.    ALLERGIES: Allergies  Allergen Reactions  . Imitrex [Sumatriptan]     Chest pain    FAMILY HISTORY: Family History  Problem Relation Age of Onset  . Lung cancer Paternal Grandfather     smoker  . Heart attack Paternal Grandmother   .  Heart attack Paternal Grandfather   . Diabetes    . Heart attack Maternal Grandfather   . Hypertension Father   . Gout Father     SOCIAL HISTORY: History   Social History  . Marital Status: Single    Spouse Name: N/A    Number of Children: 0  . 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      [employer]   Social History Main Topics  . Smoking status: Never Smoker   . Smokeless tobacco: Never Used  . Alcohol Use: No  . Drug Use: No  . Sexual Activity:    Partners: Male   Other Topics Concern  . Not on file   Social History Narrative   4 caffeine per day.  No regular exercise.  She lives with her father Brenda Sharp    REVIEW OF SYSTEMS: Constitutional: No fevers, chills, or sweats, no generalized fatigue, change in appetite Eyes: No visual changes, double vision, eye pain Ear, nose and throat: No hearing loss, ear pain, nasal congestion, sore throat Cardiovascular: No chest pain, palpitations Respiratory:  No shortness of breath at rest or with exertion, wheezes GastrointestinaI: No nausea, vomiting, diarrhea, abdominal pain, fecal incontinence Genitourinary:  No dysuria, urinary retention or frequency Musculoskeletal:  Neck discomfort Integumentary: No rash, pruritus, skin lesions Neurological: as above Psychiatric: No depression, insomnia, anxiety Endocrine: No palpitations, fatigue, diaphoresis, mood swings, change in appetite, change in weight, increased thirst Hematologic/Lymphatic:  No anemia, purpura, petechiae. Allergic/Immunologic: no itchy/runny eyes, nasal congestion, recent allergic reactions, rashes  PHYSICAL EXAM: Filed Vitals:   09/10/14 0948  BP: 140/100  Pulse: 84  Temp: 98.5 F (36.9 C)  Resp: 20   General: No acute distress Head:  Normocephalic/atraumatic Eyes:  fundi unremarkable, without vessel changes, exudates, hemorrhages or papilledema. Neck: supple, bilateral paraspinal tenderness, worse on left, full  range of motion Back: No paraspinal tenderness Heart: regular rate and rhythm Lungs: Clear to auscultation bilaterally. Vascular: No carotid bruits. Neurological Exam: Mental status: alert and oriented to person, place, and time, recent and remote memory intact, fund of knowledge intact, attention and concentration intact, speech fluent and not dysarthric, language intact. Cranial nerves: CN I: not tested CN II: pupils equal, round and reactive to light, visual fields intact, fundi unremarkable, without vessel changes, exudates, hemorrhages or papilledema. CN III, IV, VI:  full range of motion, no nystagmus but saccadic eye movements with tracking, no ptosis CN V: facial sensation intact CN VII: upper and lower face symmetric CN VIII: hearing intact CN IX, X: gag intact, uvula midline CN XI: sternocleidomastoid and trapezius muscles intact CN XII: tongue midline Bulk & Tone: normal, no fasciculations. Motor:  5/5 throughout Sensation:  Pinprick and vibration intact Deep Tendon Reflexes:  2+ throughout, toes downgoing Finger to nose testing:  No dysmetria Heel to shin:  No dysmetria Gait:  Normal station and stride, although mildly cautious.  Able to turn.  A little unsteady when walking in tandem but maintains balance. Romberg negative.  IMPRESSION: Vestibular migraine, status migrainosus.  Given the brainstem symptoms and serious Sharp effects to Imitrex in the past, I would not use triptans or ergots.  PLAN: 1.  I would like to retry Topamax.  She will start 25mg  at bedtime for 7 days, then increase to 50mg  2.  To try and abort this, we will set her up for Solumedrol 1gm daily for 3 days 3.  We will check MRI of brain with and without contrast and MRA of head. 4.  Follow up in 4 weeks  Thank you for allowing me to take part in the care of this patient.  Metta Clines, DO  CC:  Beatrice Lecher, MD

## 2014-09-10 NOTE — Patient Instructions (Addendum)
1.  We will start topamax 25mg .  Take 1 tablet at bedtime for 7 days, then 2 tablets at bedtime.  We will start topiramate (Topamax) 50mg  tablets.  We will increase the dose as follows to goal of 100mg  twice daily.  Possible side effects include: impaired thinking, sedation, paresthesias (numbness and tingling) and weight loss.  It may cause dehydration and there is a small risk for kidney stones, so make sure to stay hydrated with water during the day.  There is also a very small risk for glaucoma, so if you notice any change in your vision while taking this medication, see an ophthalmologist.    2.  To try and break the headache, we will get you Solumedrol 1000mg  daily for 3 days  [facility]/14/14 [facility] at 8:45 am  3.  We will get MRI of brain with and without contrast and MRA of head [facility]  09/28/14 7:45 4.  Follow up in 4 weeks.

## 2014-09-11 ENCOUNTER — Ambulatory Visit: Payer: BC Managed Care – PPO | Admitting: Family Medicine

## 2014-09-11 DIAGNOSIS — Z0289 Encounter for other administrative examinations: Secondary | ICD-10-CM

## 2014-09-14 ENCOUNTER — Other Ambulatory Visit (HOSPITAL_COMMUNITY): Payer: Self-pay | Admitting: *Deleted

## 2014-09-14 ENCOUNTER — Encounter (HOSPITAL_COMMUNITY)
Admission: RE | Admit: 2014-09-14 | Discharge: 2014-09-14 | Disposition: A | Discharge status: Home / Self Care | Payer: BC Managed Care – PPO | Source: Ambulatory Visit | Attending: Neurology | Admitting: Neurology

## 2014-09-14 DIAGNOSIS — G43801 Other migraine, not intractable, with status migrainosus: Secondary | ICD-10-CM | POA: Insufficient documentation

## 2014-09-14 DIAGNOSIS — I10 Essential (primary) hypertension: Secondary | ICD-10-CM | POA: Diagnosis not present

## 2014-09-14 DIAGNOSIS — Z79899 Other long term (current) drug therapy: Secondary | ICD-10-CM | POA: Insufficient documentation

## 2014-09-14 DIAGNOSIS — E039 Hypothyroidism, unspecified: Secondary | ICD-10-CM | POA: Insufficient documentation

## 2014-09-14 MED ORDER — METHYLPREDNISOLONE SODIUM SUCC 1000 MG IJ SOLR: 1000.0000 mg | Freq: Every day | INTRAMUSCULAR | Status: DC

## 2014-09-14 MED ORDER — SODIUM CHLORIDE 0.9 % IV SOLN
1000.0000 mg | INTRAVENOUS | Status: DC
  Administered 2014-09-14: 1000 mg via INTRAVENOUS
  Filled 2014-09-14: qty 8

## 2014-09-15 ENCOUNTER — Encounter (HOSPITAL_COMMUNITY)
Admission: RE | Admit: 2014-09-15 | Discharge: 2014-09-15 | Disposition: A | Discharge status: Home / Self Care | Payer: BC Managed Care – PPO | Source: Ambulatory Visit | Attending: Neurology | Admitting: Neurology

## 2014-09-15 DIAGNOSIS — G43801 Other migraine, not intractable, with status migrainosus: Secondary | ICD-10-CM | POA: Diagnosis not present

## 2014-09-15 MED ORDER — SODIUM CHLORIDE 0.9 % IV SOLN
1000.0000 mg | INTRAVENOUS | Status: DC
  Administered 2014-09-15: 1000 mg via INTRAVENOUS
  Filled 2014-09-15: qty 8

## 2014-09-16 ENCOUNTER — Encounter (HOSPITAL_COMMUNITY)
Admission: RE | Admit: 2014-09-16 | Discharge: 2014-09-16 | Disposition: A | Discharge status: Home / Self Care | Payer: BC Managed Care – PPO | Source: Ambulatory Visit | Attending: Neurology | Admitting: Neurology

## 2014-09-16 DIAGNOSIS — G43801 Other migraine, not intractable, with status migrainosus: Secondary | ICD-10-CM | POA: Diagnosis not present

## 2014-09-16 MED ORDER — SODIUM CHLORIDE 0.9 % IV SOLN
1000.0000 mg | INTRAVENOUS | Status: DC
  Administered 2014-09-16: 1000 mg via INTRAVENOUS
  Filled 2014-09-16: qty 8

## 2014-09-21 ENCOUNTER — Telehealth: Payer: Self-pay | Admitting: Neurology

## 2014-09-21 MED ORDER — ONDANSETRON HCL 4 MG PO TABS: 4.0000 mg | ORAL_TABLET | Freq: Three times a day (TID) | ORAL | Status: DC | PRN

## 2014-09-21 NOTE — Telephone Encounter (Signed)
Pt called, wants to know what she can do for migraine pain. Anything she can take? What should she do about work? CB# [phone] / Sherri S.

## 2014-09-21 NOTE — Telephone Encounter (Signed)
Pt in last week and started on soul medrol. Pt says she has seen no improvement. Please call at [phone] / Gayleen Orem.

## 2014-09-21 NOTE — Telephone Encounter (Signed)
Patient notified of advisement. She asked again about her pain, wanted to know if there is anything else she could do, also states she has nausea. I asked Dr. Tomi Likens again & he states as far as the pain there is nothing else to do right now. He is going to give Rx for Zofran. Told patient that Rx for Zofran will be sent to her pharmacy.

## 2014-09-21 NOTE — Telephone Encounter (Signed)
Unfortunately, I don't know of anything else safely indicated that can assist in breaking the vertigo acutely.  We will have to wait and see how she responds to changes in the daily medication

## 2014-09-21 NOTE — Telephone Encounter (Signed)
Please advise 

## 2014-09-22 NOTE — Telephone Encounter (Signed)
I don't have anything else to acutely break the dizziness.  We have to work with the preventative medications (topamax) which we just started, and will have to adjust from there.  At least the zofran is helping for the nausea.

## 2014-09-22 NOTE — Telephone Encounter (Signed)
She wants to know what the next step is for he migraine pain since she states the solu medrol didn't work. She states the dizziness is worse. She has started taking the zofran for the nausea & says that has helped some. She states "there has got to be something that can be done".

## 2014-09-22 NOTE — Telephone Encounter (Signed)
Called patient and again notified her of advisement.

## 2014-09-23 ENCOUNTER — Encounter: Payer: Self-pay | Admitting: *Deleted

## 2014-09-23 ENCOUNTER — Telehealth: Payer: Self-pay | Admitting: Neurology

## 2014-09-23 NOTE — Telephone Encounter (Signed)
Letter was fax to [phone] per patient request .

## 2014-09-23 NOTE — Telephone Encounter (Signed)
Pt called and left message on the voice mail. She did not leave a telephone number to call her back on. She states that she needs the records from the IV injection that was done on Monday Tuesday  and Wednesday of last week faxed to her work at [phone] she did not leave attn to who

## 2014-09-28 ENCOUNTER — Telehealth: Payer: Self-pay | Admitting: *Deleted

## 2014-09-28 ENCOUNTER — Ambulatory Visit (HOSPITAL_COMMUNITY)
Admission: RE | Admit: 2014-09-28 | Discharge: 2014-09-28 | Disposition: A | Discharge status: Home / Self Care | Payer: BC Managed Care – PPO | Source: Ambulatory Visit | Attending: Neurology | Admitting: Neurology

## 2014-09-28 DIAGNOSIS — G43809 Other migraine, not intractable, without status migrainosus: Secondary | ICD-10-CM | POA: Diagnosis present

## 2014-09-28 DIAGNOSIS — G43901 Migraine, unspecified, not intractable, with status migrainosus: Secondary | ICD-10-CM

## 2014-09-28 DIAGNOSIS — G43109 Migraine with aura, not intractable, without status migrainosus: Secondary | ICD-10-CM

## 2014-09-28 LAB — CREATININE, SERUM
Creatinine, Ser: 0.98 mg/dL (ref 0.50–1.10)
GFR calc Af Amer: 88 mL/min — ABNORMAL LOW (ref >90)
GFR calc non Af Amer: 76 mL/min — ABNORMAL LOW (ref >90)

## 2014-09-28 MED ORDER — GADOBENATE DIMEGLUMINE 529 MG/ML IV SOLN
20.0000 mL | Freq: Once | INTRAVENOUS | Status: AC
  Administered 2014-09-28: 20 mL via INTRAVENOUS

## 2014-09-28 NOTE — Telephone Encounter (Signed)
-----   Message from Dudley Major, DO sent at 09/28/2014 11:10 AM EST ----- MRI and MRA of the head are unremarkable. ----- Message -----    From: Rad Results In Interface    Sent: 09/28/2014  10:58 AM      To: Dudley Major, DO

## 2014-09-28 NOTE — Telephone Encounter (Signed)
Left message for patient that MRI and MRA of the head are unremarkable.

## 2014-10-08 ENCOUNTER — Encounter: Payer: Self-pay | Admitting: *Deleted

## 2014-10-08 ENCOUNTER — Encounter: Payer: Self-pay | Admitting: Neurology

## 2014-10-08 ENCOUNTER — Telehealth: Payer: Self-pay

## 2014-10-08 ENCOUNTER — Ambulatory Visit (INDEPENDENT_AMBULATORY_CARE_PROVIDER_SITE_OTHER): Payer: BC Managed Care – PPO | Admitting: Neurology

## 2014-10-08 VITALS — BP 158/86 | HR 100 | Temp 98.0°F | Resp 20 | Ht 63.0 in | Wt 245.0 lb

## 2014-10-08 DIAGNOSIS — Z79899 Other long term (current) drug therapy: Secondary | ICD-10-CM

## 2014-10-08 DIAGNOSIS — G43109 Migraine with aura, not intractable, without status migrainosus: Secondary | ICD-10-CM

## 2014-10-08 DIAGNOSIS — M542 Cervicalgia: Secondary | ICD-10-CM

## 2014-10-08 DIAGNOSIS — G43809 Other migraine, not intractable, without status migrainosus: Secondary | ICD-10-CM

## 2014-10-08 MED ORDER — PROMETHAZINE HCL 25 MG PO TABS: 25.0000 mg | ORAL_TABLET | Freq: Three times a day (TID) | ORAL | Status: DC | PRN

## 2014-10-08 MED ORDER — TIZANIDINE HCL 4 MG PO TABS: ORAL_TABLET | ORAL | Status: DC

## 2014-10-08 NOTE — Telephone Encounter (Signed)
Yes, we can.

## 2014-10-08 NOTE — Progress Notes (Signed)
NEUROLOGY FOLLOW UP OFFICE NOTE  Brenda Sharp [id number]  HISTORY OF PRESENT ILLNESS: Brenda Sharp is a 34 year old right-handed woman with hypothyroidism, hypertension, and depression who follows up for possible vestibular migraine.  MRI and MRA reviewed.  She is accompanied by her father who provides some history.  UPDATE: She was given 3 days of pulse IV Solumedrol, which did not break the chronicity of her symptoms.  She was started on topiramate 50mg  and Zofran.  All of these medications made the headaches and  Nausea worse.  MRI of the brain with and without contrast and MRA of the head were unremarkable.  She also reports increased numbness in her 5th digit of her left hand.  She denies shooting pain down the arm.  She does lean on her elbows often.  HISTORY: She reports that she was diagnosed with migraine at 9 weeks old, which at first they thought were seizures.  She was worked up at [facility], where she was given this diagnosis.  Her headaches are located on the right Sharp and back of the head.  It is a stabbing and pressure quality.  It is 10/10 intensity.  It lasts 16 to 18 hours.  It occurs approximately twice a year.  It is associated with vertigo (spinning towards the left), nausea, vomiting, photophobia, phonophobia.  Previously, she had visual auras of blue halos, but not recently.  She occasionally takes Tylenol.  She takes Cymbalta 60mg .  Previous abortive medication included Imitrex (cardiac arrest), stadol, phenergan, Reglan, demerol, Tylenol, Advil, Aleve and Excedrin.  Prior preventative medication includes topiramate, tegretol, depakote, Inderal, zoloft, HTCZ.  Most of these medications she hasn't taken for many years.  Since mid-September, she has had persistent vertigo associated with the migraine headache.  However, the headache is less intense (about 5/10) and not associated with vomiting.  The vertigo is better when laying down.  She finds that her head is involuntarily moving  towards the left in the direction of the spinning.  There is no associated double vision, tinnitus, hearing loss, slurred speech, focal numbness or weakness.  She has not had problems with gait and denies falls.  She works as a Scientist, research (physical sciences) and has been out of work off and on for 3 1/2 weeks.  She tried meclizine which was ineffective and made her sleepy.  She went to vestibular rehab, which wasn't effective.  She has been on prednisone for gout, which did not effect the headaches and vertigo.  Neck feels sore.  PAST MEDICAL HISTORY: Past Medical History  Diagnosis Date  . Hypertension   . Headache(784.0)   . Hypothyroidism   . Kidney stone   . PCOS (polycystic ovarian syndrome)     MEDICATIONS: Current Outpatient Prescriptions on File Prior to Visit  Medication Sig Dispense Refill  . acetaminophen (TYLENOL) 500 MG tablet Take 1,000 mg by mouth every 6 (six) hours as needed for moderate pain.    Marland Kitchen colchicine (COLCRYS) 0.6 MG tablet TAKE 1 TABLET BY MOUTH DAILY 30 tablet 6  . diazepam (VALIUM) 2 MG tablet TAKE 1 TABLET BY MOUTH TWICE DAILY AS NEEDED FOR VERTIGO 60 tablet 1  . DULoxetine (CYMBALTA) 60 MG capsule Take 1 capsule (60 mg total) by mouth daily. 90 capsule 1  . labetalol (NORMODYNE) 300 MG tablet Take 1 tablet (300 mg total) by mouth 2 (two) times daily. 60 tablet 1  . levothyroxine (SYNTHROID, LEVOTHROID) 112 MCG tablet Take 1 tablet (112 mcg total) by mouth  daily. 30 tablet 3  . metFORMIN (GLUCOPHAGE) 500 MG tablet Take 1 tablet (500 mg total) by mouth 2 (two) times daily with a meal. 60 tablet 3  . NIFEdipine (PROCARDIA-XL/ADALAT-CC/NIFEDICAL-XL) 30 MG 24 hr tablet Take 1 tablet (30 mg total) by mouth daily. 30 tablet 3  . ondansetron (ZOFRAN) 4 MG tablet Take 1 tablet (4 mg total) by mouth every 8 (eight) hours as needed for nausea or vomiting. (Patient not taking: Reported on 10/08/2014) 30 tablet 0  . probenecid (BENEMID) 500 MG tablet Take 1 tablet (500 mg total) by mouth  daily. 30 tablet 6  . topiramate (TOPAMAX) 25 MG tablet Take 1 tablet at bedtime for 7 days, then 2 tablets at bedtime 60 tablet 0   No current facility-administered medications on file prior to visit.    ALLERGIES: Allergies  Allergen Reactions  . Imitrex [Sumatriptan]     Chest pain    FAMILY HISTORY: Family History  Problem Relation Age of Onset  . Lung cancer Paternal Grandfather     smoker  . Heart attack Paternal Grandmother   . Heart attack Paternal Grandfather   . Diabetes    . Heart attack Maternal Grandfather   . Hypertension Father   . Gout Father     SOCIAL HISTORY: History   Social History  . Marital Status: Married    Spouse Name: N/A  . Number of Children: 0  . 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      [employer]   Social History Main Topics  . Smoking status: Never Smoker   . Smokeless tobacco: Never Used  . Alcohol Use: No  . Drug Use: No  . Sexual Activity:    Partners: Male   Other Topics Concern  . Not on file   Social History Narrative   4 caffeine per day.  No regular exercise.  She lives with her father Brenda Sharp    REVIEW OF SYSTEMS: Constitutional: No fevers, chills, or sweats, no generalized fatigue, change in appetite Eyes: No visual changes, double vision, eye pain Ear, nose and throat: Dizziness Cardiovascular: No chest pain, palpitations Respiratory:  No shortness of breath at rest or with exertion, wheezes GastrointestinaI: Nausea Genitourinary:  No dysuria, urinary retention or frequency Musculoskeletal:  Neck pain Integumentary: No rash, pruritus, skin lesions Neurological: as above Psychiatric: No depression, insomnia, anxiety Endocrine: No palpitations, fatigue, diaphoresis, mood swings, change in appetite, change in weight, increased thirst Hematologic/Lymphatic:  No anemia, purpura, petechiae. Allergic/Immunologic: no itchy/runny eyes, nasal congestion, recent allergic  reactions, rashes  PHYSICAL EXAM: Filed Vitals:   10/08/14 0748  BP: 158/86  Pulse: 100  Temp: 98 F (36.7 C)  Resp: 20   General: No acute distress Head:  Normocephalic/atraumatic Eyes:  Fundoscopic exam unremarkable without vessel changes, exudates, hemorrhages or papilledema. Neck: supple, bilateral  paraspinal tenderness particularly on the left, full range of motion.  Neck periodically will twist to the left. Heart:  Regular rate and rhythm Lungs:  Clear to auscultation bilaterally Back: No paraspinal tenderness Neurological Exam: alert and oriented to person, place, and time. Attention span and concentration intact, recent and remote memory intact, fund of knowledge intact.  Speech fluent and not dysarthric, language intact.  Nystagmus in all directions on orthophoric gaze.  Otherwise CN II-XII intact. Fundoscopic exam unremarkable without vessel changes, exudates, hemorrhages or papilledema.  Bulk and tone normal, muscle strength 5/5 throughout.  Reduced light-touch sensation to the 5th digit, part of  4th digit and ulnar aspect of left hand.  Positive Tinel's at the elbow.  Finger to nose testing intact.  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  She has failed multiple medications which I would prescribe.  Due to her history of cardiac arrest and the fact that these migraines are related to the posterior cerebral vascular system, I do not feel comfortable prescribing triptans or DHE.  I think she would best be treated by a headache specialist. 2.  In the meantime, I will start her on verapamil.  I will check an EKG first.  If it looks okay, we will start 80mg  three times daily. 3.  Will also prescribe tizanidine 2-4mg  as needed for neck pain which may be contributing to headache.  She would like to try Phenergan 25mg  again. 4.  Advised not to lean on her elbows.  Metta Clines, DO  CC:  Beatrice Lecher, MD

## 2014-10-08 NOTE — Patient Instructions (Addendum)
1.  We will get an EKG and if it looks okay, we will start verapamil 80mg  three times daily 2.  Will prescribe phenergan 25mg  every 8 hours as needed for nausea 3.  Will prescribe tizanidine 4mg .  Take 1/2 to 1 full tablet every 8 hours as needed for neck pain 4.  Will place referral to headache and neck pain specialist (Dr. Catalina Gravel) 5. Try not to lean on your elbows

## 2014-10-08 NOTE — Telephone Encounter (Signed)
Brenda Sharp has an appointment tomorrow at 4 pm to recheck blood pressure and mood. Her Neurologist ordered an EKG. She would like to see if we could do an EKG at her appointment tomorrow. Please advise.

## 2014-10-08 NOTE — Telephone Encounter (Signed)
Patient advised.

## 2014-10-09 ENCOUNTER — Ambulatory Visit (INDEPENDENT_AMBULATORY_CARE_PROVIDER_SITE_OTHER): Payer: BC Managed Care – PPO | Admitting: Family Medicine

## 2014-10-09 ENCOUNTER — Encounter: Payer: Self-pay | Admitting: Family Medicine

## 2014-10-09 VITALS — BP 136/84 | HR 82 | Wt 244.0 lb

## 2014-10-09 DIAGNOSIS — Z79899 Other long term (current) drug therapy: Secondary | ICD-10-CM | POA: Diagnosis not present

## 2014-10-09 DIAGNOSIS — N3001 Acute cystitis with hematuria: Secondary | ICD-10-CM | POA: Diagnosis not present

## 2014-10-09 MED ORDER — ALLOPURINOL 100 MG PO TABS: 100.0000 mg | ORAL_TABLET | Freq: Every day | ORAL | Status: DC

## 2014-10-09 NOTE — Progress Notes (Signed)
   Subjective:    Patient ID: Brenda Sharp, female    DOB: 01-05-1981, 34 y.o.   MRN: [id number]  HPI Mood - she says overall she's doing okay at home has been a little bit more stressed at work. She has a new boss. She is a fifth grade teacher. They have been coming down on her for missing a lot of work. But she's been seeing the neurologist frequently and has missed appointments for hospital IV infusion of Solu-Medrol as well as an MRI. 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 She's currently taking Cymbalta. She denies any significant side effects on it. Next  Migraine headaches-she's now been referred to a headache specialist. They did request an EKG today so she could potentially be started on verapamil. Next  Gout-she's been flaring frequently. We had switched her to probenecid about a year ago because she was going to try to get pregnant. At this point she and her husband are going to put those plans on hold area     Review of Systems     Objective:   Physical Exam  Constitutional: She is oriented to person, place, and time. She appears well-developed and well-nourished.  HENT:  Head: Normocephalic and atraumatic.  Eyes: Conjunctivae and EOM are normal.  Cardiovascular: Normal rate.   Pulmonary/Chest: Effort normal.  Neurological: She is alert and oriented to person, place, and time.  Skin: Skin is dry. No pallor.  Psychiatric: She has a normal mood and affect. Her behavior is normal.          Assessment & Plan:  Depression/anxiety-gad 7 score of 7 and PHQ 9 score of 7. Symptoms are not maximally controlled at this moment but it seems more situational. I did encourage her to speak to her human resources department and see if they can get her FMLA paperwork for [place] to complete so that she is covered for her time away from work for her medical problems and to see her providers. Next  Migraine headaches-will do EKG today. Next  Gout-we'll discontinue probenecid and  switch her back to allopurinol. We'll start with 100 mg and titrate as needed. I'll see her back in 2 months and we can recheck a uric acid level at that time and then adjust her allopurinol.

## 2014-10-12 NOTE — Addendum Note (Signed)
Addended by: Eliezer Lofts on: 10/12/2014 08:56 AM   Modules accepted: Orders

## 2014-10-14 ENCOUNTER — Telehealth: Payer: Self-pay | Admitting: *Deleted

## 2014-10-14 ENCOUNTER — Encounter: Payer: Self-pay | Admitting: Family Medicine

## 2014-10-14 ENCOUNTER — Other Ambulatory Visit: Payer: Self-pay | Admitting: *Deleted

## 2014-10-14 DIAGNOSIS — Z0289 Encounter for other administrative examinations: Secondary | ICD-10-CM

## 2014-10-14 DIAGNOSIS — G43009 Migraine without aura, not intractable, without status migrainosus: Secondary | ICD-10-CM

## 2014-10-14 DIAGNOSIS — G43909 Migraine, unspecified, not intractable, without status migrainosus: Secondary | ICD-10-CM | POA: Insufficient documentation

## 2014-10-14 MED ORDER — VERAPAMIL HCL 80 MG PO TABS: 80.0000 mg | ORAL_TABLET | Freq: Three times a day (TID) | ORAL | Status: DC

## 2014-10-14 NOTE — Telephone Encounter (Signed)
Patient is aware of EKG results  Verapamil 80 mg tid # 90 with 2 refills was called to pharmacy

## 2014-10-14 NOTE — Telephone Encounter (Signed)
FMLA forms faxed, copy placed in scan, conformation received.Brenda Sharp [place]

## 2014-10-16 ENCOUNTER — Other Ambulatory Visit: Payer: Self-pay | Admitting: Neurology

## 2014-10-16 DIAGNOSIS — M436 Torticollis: Secondary | ICD-10-CM

## 2014-10-16 DIAGNOSIS — M542 Cervicalgia: Secondary | ICD-10-CM

## 2014-10-20 ENCOUNTER — Ambulatory Visit: Payer: BC Managed Care – PPO | Admitting: Neurology

## 2014-10-21 ENCOUNTER — Telehealth: Payer: Self-pay | Admitting: Neurology

## 2014-10-21 NOTE — Telephone Encounter (Signed)
Left message on machine for patient to call back. To see if she can move her appt tomorrow to 2 pm. Awaiting call back.

## 2014-10-21 NOTE — Telephone Encounter (Signed)
Spoke with patient and appt moved.

## 2014-10-21 NOTE — Telephone Encounter (Signed)
Pt called/returning your call at 12:52 PM C/b [phone]

## 2014-10-22 ENCOUNTER — Encounter: Payer: Self-pay | Admitting: Neurology

## 2014-10-22 ENCOUNTER — Ambulatory Visit (INDEPENDENT_AMBULATORY_CARE_PROVIDER_SITE_OTHER): Payer: BC Managed Care – PPO | Admitting: Neurology

## 2014-10-22 VITALS — BP 144/88 | HR 80 | Ht 63.0 in | Wt 246.0 lb

## 2014-10-22 DIAGNOSIS — F444 Conversion disorder with motor symptom or deficit: Secondary | ICD-10-CM

## 2014-10-22 NOTE — Progress Notes (Signed)
Brenda Sharp was seen today in neurologic consultation at the request of Dr. Catalina Gravel and Dr. Tomi Likens.  Her PCP isMETHENEY,CATHERINE, MD.  The consultation is for the evaluation of "twisting of the neck" per the patient and possible cervical dystonia.  Pt states that it started at the end of December/January.  She states that is started slowly.  Doesn't happen when she is laying down.  She states that just sitting up she will notice a twitch.  Her mother states that the head will "twich and bobble" in different directions.  Her mother states that she will also twitch in the shoulders.  There is also pain in the L side of the shoulder.  "I have watched you tube videos on torticollis and the muscle on the L is pulling."  She has been leaning on her elbow on the left so her left pinky and ring finger have been numb.  She doesn't think that stress brings it out.  No medication changes when all of this started.  She denies taking phenergan often (states was taking it when using topamax but no longer).     Neuroimaging has previously been performed.  It is available for my review today.  MRI brain done recently but lots of motion artificant.  I did review it.  Nothing obviously acute.  ALLERGIES:   Allergies  Allergen Reactions  . Imitrex [Sumatriptan]     Chest pain    CURRENT MEDICATIONS:  Outpatient Encounter Prescriptions as of 10/22/2014  Medication Sig  . acetaminophen (TYLENOL) 500 MG tablet Take 1,000 mg by mouth every 6 (six) hours as needed for moderate pain.  Marland Kitchen allopurinol (ZYLOPRIM) 100 MG tablet Take 1 tablet (100 mg total) by mouth daily.  . baclofen (LIORESAL) 10 MG tablet Take 10 mg by mouth as needed.   . colchicine (COLCRYS) 0.6 MG tablet TAKE 1 TABLET BY MOUTH DAILY  . diazepam (VALIUM) 2 MG tablet TAKE 1 TABLET BY MOUTH TWICE DAILY AS NEEDED FOR VERTIGO  . DULoxetine (CYMBALTA) 60 MG capsule Take 1 capsule (60 mg total) by mouth daily.  . flurbiprofen (ANSAID) 100 MG tablet Take 100  mg by mouth as needed.   . gabapentin (NEURONTIN) 300 MG capsule Take 600 mg by mouth 3 (three) times daily.   Marland Kitchen labetalol (NORMODYNE) 300 MG tablet Take 1 tablet (300 mg total) by mouth 2 (two) times daily.  Marland Kitchen levothyroxine (SYNTHROID, LEVOTHROID) 112 MCG tablet Take 1 tablet (112 mcg total) by mouth daily.  . metFORMIN (GLUCOPHAGE) 500 MG tablet Take 1 tablet (500 mg total) by mouth 2 (two) times daily with a meal.  . NIFEdipine (PROCARDIA-XL/ADALAT-CC/NIFEDICAL-XL) 30 MG 24 hr tablet Take 1 tablet (30 mg total) by mouth daily.  . promethazine (PHENERGAN) 25 MG tablet Take 1 tablet (25 mg total) by mouth every 8 (eight) hours as needed for nausea or vomiting.  . [DISCONTINUED] tiZANidine (ZANAFLEX) 4 MG tablet Take 0.5 to 1 tab every 8 hours as needed for neck pain.  . [DISCONTINUED] verapamil (CALAN) 80 MG tablet Take 1 tablet (80 mg total) by mouth 3 (three) times daily.    PAST MEDICAL HISTORY:   Past Medical History  Diagnosis Date  . Hypertension   . Headache(784.0)   . Hypothyroidism   . Kidney stone   . PCOS (polycystic ovarian syndrome)     PAST SURGICAL HISTORY:   Past Surgical History  Procedure Laterality Date  . Tonsillectomy  1990  . Wisdom tooth extraction  2006  .  Lithotripsy      SOCIAL HISTORY:   History   Social History  . Marital Status: Married    Spouse Name: N/A  . Number of Children: 0  . 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 Smoking status: Never Smoker   . Smokeless tobacco: Never Used  . Alcohol Use: No  . Drug Use: No  . Sexual Activity:    Partners: Male   Other Topics Concern  . Not on file   Social History Narrative   4 caffeine per day.  No regular exercise.  She lives with her father [name]    FAMILY HISTORY:   Family Status  Relation Status Death Age  . Father Alive     gout, HTN, cardiomyopathy, obesity  . Mother Alive     healthy    ROS:  A  complete 10 system review of systems was obtained and was unremarkable apart from what is mentioned above.  PHYSICAL EXAMINATION:    VITALS:   Filed Vitals:   10/22/14 1358  BP: 144/88  Pulse: 80  Height: 5\' 3"  (1.6 m)  Weight: 246 lb (111.585 kg)    GEN:  Normal appears female in no acute distress.  Appears stated age. HEENT:  Normocephalic, atraumatic. The mucous membranes are moist. The superficial temporal arteries are without ropiness or tenderness. Cardiovascular: Regular rate and rhythm. Lungs: Clear to auscultation bilaterally. Neck/Heme: There are no carotid bruits noted bilaterally.  NEUROLOGICAL: Orientation:  The patient is alert and oriented x 3.  Fund of knowledge is appropriate.  Recent and remote memory intact.  Attention span and concentration normal.  Repeats and names without difficulty. Cranial nerves: There is good facial symmetry. The pupils are equal round and reactive to light bilaterally. Fundoscopic exam reveals clear disc margins bilaterally. Extraocular muscles are intact and visual fields are full to confrontational testing. Speech is fluent and clear. Soft palate rises symmetrically and there is no tongue deviation. Hearing is intact to conversational tone. Tone: Tone is good throughout. Sensation: Sensation is intact to light touch and pinprick throughout (facial, trunk, extremities). Vibration is intact at the bilateral big toe. There is no extinction with double simultaneous stimulation. There is no sensory dermatomal level identified. Coordination:  The patient has no difficulty with RAM's or FNF bilaterally. Motor: Strength is 5/5 in the bilateral upper and lower extremities.  Shoulder shrug is equal and symmetric. There is no pronator drift.  There are no fasciculations noted. DTR's: Deep tendon reflexes are 2+-3-/4 at the bilateral biceps, triceps, brachioradialis, patella and achilles.  Plantar responses are downgoing bilaterally. Gait and Station: The  patient is able to ambulate without difficulty. The patient is able to heel toe walk without any difficulty. The patient has minimal difficulty ambulating in tandem fashion. The patient is able to stand in the Romberg position. Abnormal movements:  Pt has irregular, jerky, non-rhythmic movement of the head in various directions but most often to the left and tilted back (occiput to the back).  Sometimes she holds the left face and sometimes the right face to keep the head still.  When asked to tap out various rhythms with her hands, the head tremor changes in frequency and direction.  No hypertrophy of SCM or splenius capitus.   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  Much greater than 50% of the 60 min visit spent in counseling with the patient and her mother.  Her mother stated that this all started around the time that her baby was due but she miscarried instead.  Her mother also identified several other stressors in her life, although the patient denied any stress.  In any case, I told her that I do not think that Botox would be of any value to her.  I talked to her about counseling.  I gave her some resources.  We talked about neuropsych/counseling.  Her mother was very interested and they are going to explore the resources that we talked about. 2.  Neck pain.  -She is somewhat hyperreflexic and I agree with Dr. Catalina Gravel on doing the cervical MRI 3.  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  She will continue to follow-up with her other neurologists as previously scheduled.    a

## 2014-10-22 NOTE — Patient Instructions (Signed)
Alain Marion) [place], MSW, [place] Worker Phone: [phone], Fax: [phone]   Can provide resources in the area for psychogenic movement disorders.

## 2014-10-22 NOTE — Progress Notes (Signed)
Note faxed to Dr Catalina Gravel at [phone] with confirmation received.

## 2014-10-25 ENCOUNTER — Ambulatory Visit
Admission: RE | Admit: 2014-10-25 | Discharge: 2014-10-25 | Disposition: A | Discharge status: Home / Self Care | Payer: BC Managed Care – PPO | Source: Ambulatory Visit | Attending: Neurology | Admitting: Neurology

## 2014-10-25 DIAGNOSIS — M542 Cervicalgia: Secondary | ICD-10-CM

## 2014-10-25 DIAGNOSIS — M436 Torticollis: Secondary | ICD-10-CM

## 2014-10-26 ENCOUNTER — Telehealth: Payer: Self-pay | Admitting: Neurology

## 2014-10-26 NOTE — Telephone Encounter (Signed)
She states that she needs a note faxed to her school for last Thursday please fax note to Nyoka Lint at [phone]

## 2014-10-26 NOTE — Telephone Encounter (Signed)
Note written and faxed to number provided with confirmation received.

## 2014-11-10 ENCOUNTER — Ambulatory Visit (INDEPENDENT_AMBULATORY_CARE_PROVIDER_SITE_OTHER): Payer: BC Managed Care – PPO | Admitting: Neurology

## 2014-11-10 ENCOUNTER — Encounter: Payer: Self-pay | Admitting: Neurology

## 2014-11-10 VITALS — BP 154/108 | HR 94 | Ht 63.0 in | Wt 244.0 lb

## 2014-11-10 DIAGNOSIS — F444 Conversion disorder with motor symptom or deficit: Secondary | ICD-10-CM | POA: Diagnosis not present

## 2014-11-10 MED ORDER — ALPRAZOLAM 0.5 MG PO TABS: 1.0000 mg | ORAL_TABLET | Freq: Three times a day (TID) | ORAL | Status: DC | PRN

## 2014-11-10 NOTE — Progress Notes (Signed)
PATIENT: Brenda Sharp DOB: [birth year]  HISTORICAL  Brenda Sharp is a 34 yo RH female, accompanied by her father. Referred by Dr. Madilyn Fireman, and Dr. 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 Catalina Gravel, had MRI of the brain, MRA of the brain that was normal, MRI of cervical spine that was normal,  She also was seen by movement disorder specialist Dr. 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  Over the past few months, she has tried physical therapy, massage, hot compression, stretching exercise, Valium, gabapentin, NSAIDs, , without improving her symptoms  She has no abnormal neck posturing, during her sleep, but she has gradually worsening neck pulling towards the left side while she was awake, she has to take frequent days off from her job.  She denies gait difficulty, no bowel and bladder incontinence, intermittent left fourth fifth finger paresthesia, no significant weakness, persistent neck pain, especially on the left side,  REVIEW OF SYSTEMS: Full 14 system review of systems performed and notable only for headache, sleepiness, dizziness, tremor, decreased energy  ALLERGIES: Allergies  Allergen Reactions  . Imitrex [Sumatriptan]     Chest pain    HOME MEDICATIONS: Current Outpatient Prescriptions  Medication Sig Dispense Refill  . allopurinol (ZYLOPRIM) 100 MG tablet Take 1 tablet (100 mg total) by mouth daily. 30 tablet 6  . colchicine (COLCRYS) 0.6 MG tablet TAKE 1 TABLET BY MOUTH DAILY 30 tablet 6  . diazepam (VALIUM) 2 MG tablet TAKE 1 TABLET BY MOUTH TWICE DAILY AS NEEDED FOR VERTIGO 60 tablet 1  . DULoxetine  (CYMBALTA) 60 MG capsule Take 1 capsule (60 mg total) by mouth daily. 90 capsule 1  . labetalol (NORMODYNE) 300 MG tablet Take 1 tablet (300 mg total) by mouth 2 (two) times daily. 60 tablet 1  . levothyroxine (SYNTHROID, LEVOTHROID) 112 MCG tablet Take 1 tablet (112 mcg total) by mouth daily. 30 tablet 3  . metFORMIN (GLUCOPHAGE) 500 MG tablet Take 1 tablet (500 mg total) by mouth 2 (two) times daily with a meal. 60 tablet 3  . methocarbamol (ROBAXIN) 500 MG tablet   1  . NIFEdipine (PROCARDIA-XL/ADALAT-CC/NIFEDICAL-XL) 30 MG 24 hr tablet Take 1 tablet (30 mg total) by mouth daily. 30 tablet 3  . nortriptyline (PAMELOR) 10 MG capsule   0  . promethazine (PHENERGAN) 25 MG tablet Take 1 tablet (25 mg total) by mouth every 8 (eight) hours as needed for nausea or vomiting. 60 tablet 0   No current facility-administered medications for this visit.    PAST MEDICAL HISTORY: Past Medical History  Diagnosis Date  . Hypertension   . Headache(784.0)   . Hypothyroidism   . Kidney stone   . PCOS (polycystic ovarian syndrome)   . Miscarriage   . Torticollis   . Gout     PAST SURGICAL HISTORY: Past Surgical History  Procedure Laterality Date  . Tonsillectomy  1990  . Wisdom tooth extraction  2006  . Lithotripsy      FAMILY HISTORY: Family History  Problem Relation Age of Onset  . Lung cancer Paternal Grandfather     smoker  . Heart attack Paternal Grandmother   . Heart attack Paternal Grandfather   .  Heart attack Maternal Grandfather   . Hypertension Father   . Gout Father   . Depression Mother     SOCIAL HISTORY:  History   Social History  . Marital Status: Married    Spouse Name: Ed  . Number of Children: 0  . Years of Education: college   Occupational History  . 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 Smoking status: Never Smoker   . Smokeless tobacco: Never Used  . Alcohol Use: No  . Drug Use: No  . Sexual Activity:    Partners: Male   Other  Topics Concern  . Not on file   Social History Narrative   1 cup caffeine/day   Right-handed.   Lives at home with husband.     PHYSICAL EXAM   Filed Vitals:   11/10/14 0809  BP: 154/108  Pulse: 94  Height: 5\' 3"  (1.6 m)  Weight: 244 lb (110.678 kg)    Not recorded      Body mass index is 43.23 kg/(m^2).  PHYSICAL EXAMNIATION:  Gen: NAD, conversant, well nourised, obese, well groomed                     Cardiovascular: Regular rate rhythm, no peripheral edema, warm, nontender. Eyes: Conjunctivae clear without exudates or hemorrhage Neck: Supple, no carotid bruise. Pulmonary: Clear to auscultation bilaterally   NEUROLOGICAL EXAM:  MENTAL STATUS: She has almost constant neck turning to the left side, intermittent neck jerking movement, variable pattern, tends to have right shoulder elevations, full range of motion of her neck Speech:    Speech is normal; fluent and spontaneous with normal comprehension.  Cognition:    The patient is oriented to person, place, and time;     recent and remote memory intact;     language fluent;     normal attention, concentration,     fund of knowledge.  CRANIAL NERVES: CN II: Visual fields are full to confrontation. Fundoscopic exam is normal with sharp discs and no vascular changes. Venous pulsations are present bilaterally. Pupils are 4 mm and briskly reactive to light. Visual acuity is 20/20 bilaterally. CN III, IV, VI: extraocular movement are normal. No ptosis. CN V: Facial sensation is intact to pinprick in all 3 divisions bilaterally. Corneal responses are intact.  CN VII: Face is symmetric with normal eye closure and smile. CN VIII: Hearing is normal to rubbing fingers CN IX, X: Palate elevates symmetrically. Phonation is normal. CN XI: Head turning and shoulder shrug are intact CN XII: Tongue is midline with normal movements and no atrophy.  MOTOR: There is no pronator drift of out-stretched arms. Muscle bulk and tone are  normal. Muscle strength is normal.   Shoulder abduction Shoulder external rotation Elbow flexion Elbow extension Wrist flexion Wrist extension Finger abduction Hip flexion Knee flexion Knee extension Ankle dorsi flexion Ankle plantar flexion  R 5 5 5 5 5 5 5 5 5 5 5 5   L 5 5 5 5 5 5 5 5 5 5 5 5     REFLEXES: Reflexes are 2+ and symmetric at the biceps, triceps, knees, and ankles. Plantar responses are flexor.  SENSORY: Light touch, pinprick, position sense, and vibration sense are intact in fingers and toes.  COORDINATION: Rapid alternating movements and fine finger movements are intact. There is no dysmetria on finger-to-nose and heel-knee-shin. There are no abnormal or extraneous movements.   GAIT/STANCE: Posture is normal. Gait is steady  with normal steps, base, arm swing, and turning. Heel and toe walking are normal. Tandem gait is normal.  Romberg is absent.   DIAGNOSTIC DATA (LABS, IMAGING, TESTING) - I reviewed patient records, labs, notes, testing and imaging myself where available.  Lab Results  Component Value Date   WBC 14.3* 09/12/2013   HGB 15.0 09/12/2013   HCT 42.6 09/12/2013   MCV 90.8 09/12/2013   PLT 361 09/12/2013      Component Value Date/Time   NA 135 05/25/2014 1026   NA 141 06/02/2011   K 4.5 05/25/2014 1026   CL 105 05/25/2014 1026   CO2 26 05/25/2014 1026   GLUCOSE 105* 05/25/2014 1026   BUN 9 05/25/2014 1026   BUN 11 06/02/2011   CREATININE 0.98 09/28/2014 0820   CREATININE 0.86 05/25/2014 1026   CREATININE 0.8 06/02/2011   CALCIUM 9.2 05/25/2014 1026   PROT 6.5 05/25/2014 1026   ALBUMIN 4.2 05/25/2014 1026   AST 40* 05/25/2014 1026   ALT 35 05/25/2014 1026   ALKPHOS 74 05/25/2014 1026   BILITOT 0.3 05/25/2014 1026   GFRNONAA 76* 09/28/2014 0820   GFRNONAA >89 05/25/2014 1026   GFRAA 88* 09/28/2014 0820   GFRAA >89 05/25/2014 1026   Lab Results  Component Value Date   CHOL 224* 07/22/2012   HDL 33* 07/22/2012   LDLCALC 137*  07/22/2012   TRIG 271* 07/22/2012   CHOLHDL 6.8 07/22/2012   Lab Results  Component Value Date   HGBA1C 5.9 11/29/2012   No results found for: [id number] Lab Results  Component Value Date   TSH 5.489* 05/25/2014     ASSESSMENT AND PLAN  TONNA PALAZZI is a 34 y.o. 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 Ph.D.  [facility] [address] [place], [place] [address] Ph: [phone] Fax: [phone]

## 2014-11-24 ENCOUNTER — Telehealth: Payer: Self-pay | Admitting: Neurology

## 2014-11-24 NOTE — Telephone Encounter (Signed)
Patient is calling to let you know that the Xanex 0.5 mg and the methocarbamol 500 mg is not helping. Also, physical therapy cannot see her until the end of May. Please call to discuss.

## 2014-11-24 NOTE — Telephone Encounter (Signed)
Medications are not helping.  She would like to know if there is alternate medication to try while waiting for PT.  Her follow up appt with you is 12/01/14.

## 2014-11-25 MED ORDER — SERTRALINE HCL 50 MG PO TABS: 50.0000 mg | ORAL_TABLET | Freq: Every day | ORAL | Status: DC

## 2014-11-25 NOTE — Telephone Encounter (Signed)
Says she has tried Zoloft without benefit in the past.  She is asking to try Botox injections.

## 2014-11-25 NOTE — Telephone Encounter (Signed)
She is not a candidate for BOTOX injection at this point, advise her keep follow up appt,   If she has tried and failed Zoloft in the past, We may try Baclofen 10mg  tid,

## 2014-11-25 NOTE — Telephone Encounter (Signed)
She has also tried and failed baclofen in the past.  I offered her an earlier appt but she was not able to get transportation.  She will keep her appt on 12/02/14 to further discuss treatment options.

## 2014-11-25 NOTE — Telephone Encounter (Signed)
Please let patient know, I have called in Zoloft 50 mg 1 tablets every morning, 30 tablets, with 6 refills, Zoloft is antianxiety medications, to see if this can help her symptoms

## 2014-12-02 ENCOUNTER — Ambulatory Visit (INDEPENDENT_AMBULATORY_CARE_PROVIDER_SITE_OTHER): Payer: BC Managed Care – PPO | Admitting: Neurology

## 2014-12-02 ENCOUNTER — Encounter: Payer: Self-pay | Admitting: Neurology

## 2014-12-02 VITALS — BP 132/101 | HR 94 | Ht 63.0 in | Wt 244.0 lb

## 2014-12-02 DIAGNOSIS — M542 Cervicalgia: Secondary | ICD-10-CM | POA: Diagnosis not present

## 2014-12-02 DIAGNOSIS — F444 Conversion disorder with motor symptom or deficit: Secondary | ICD-10-CM

## 2014-12-02 MED ORDER — TRIHEXYPHENIDYL HCL 2 MG PO TABS: 2.0000 mg | ORAL_TABLET | Freq: Three times a day (TID) | ORAL | Status: DC

## 2014-12-02 MED ORDER — PREGABALIN 100 MG PO CAPS: 100.0000 mg | ORAL_CAPSULE | Freq: Three times a day (TID) | ORAL | Status: DC

## 2014-12-02 NOTE — Progress Notes (Signed)
PATIENT: Brenda Sharp DOB: [birth year]  HISTORICAL  Brenda Sharp is a 34 yo RH female, accompanied by her father. Referred by Dr. Madilyn Fireman, and Dr. 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 Catalina Gravel, had MRI of the brain, MRA of the brain that was normal, MRI of cervical spine that was normal,  She also was seen by movement disorder specialist Dr. 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  Over the past few months, she has tried physical therapy, massage, hot compression, stretching exercise, Valium, gabapentin, NSAIDs, , without improving her symptoms  She has no abnormal neck posturing, during her sleep, but she has gradually worsening neck pulling towards the left side while she was awake, she has to take frequent days off from her job.  She denies gait difficulty, no bowel and bladder incontinence, intermittent left fourth fifth finger paresthesia, no significant weakness, persistent neck pain, especially on the left side,  UPDATE April 27th 2016:  She is with her father at today's clinical visit, continue complaining on tolerable almost persistent forceful neck turning to the left side, she complains of side effect from medications, nausea or vomiting, upset stomach, Xanax, Valium, does not help her symptoms much,   She also complains of left side neck pain, coming down left side of her back, she sleeps well, no abnormal neck posturing during sleep. She is able to go to work as [employer], but has hard time to keep up.  She has no trouble walking.   She has tried different medications in the past, includes zoloft,  prozac, baclofen, gabapentin, robaxin, xanax, valium.topamax, without helping her symptoms,  She has appointment with [facility] movement disorder specialist in June, will receive physical therapy in May   REVIEW OF SYSTEMS: Full 14 system review of systems performed and notable only for headache, sleepiness, dizziness, tremor, decreased energy  ALLERGIES: Allergies  Allergen Reactions  . Imitrex [Sumatriptan]     Chest pain    HOME MEDICATIONS: Current Outpatient Prescriptions  Medication Sig Dispense Refill  . allopurinol (ZYLOPRIM) 100 MG tablet Take 1 tablet (100 mg total) by mouth daily. 30 tablet 6  . ALPRAZolam (XANAX) 0.5 MG tablet Take 2 tablets (1 mg total) by mouth 3 (three) times daily as needed for anxiety. 90 tablet 0  . colchicine (COLCRYS) 0.6 MG tablet TAKE 1 TABLET BY MOUTH DAILY 30 tablet 6  . DULoxetine (CYMBALTA) 60 MG capsule Take 1 capsule (60 mg total) by mouth daily. 90 capsule 1  . labetalol (NORMODYNE) 300 MG tablet Take 1 tablet (300 mg total) by mouth 2 (two) times daily. 60 tablet 1  . levothyroxine (SYNTHROID, LEVOTHROID) 112 MCG tablet Take 1 tablet (112 mcg total) by mouth daily. 30 tablet 3  . metFORMIN (GLUCOPHAGE) 500 MG tablet Take 1 tablet (500 mg total) by mouth 2 (two) times daily with a meal. 60 tablet 3  . NIFEdipine (PROCARDIA-XL/ADALAT-CC/NIFEDICAL-XL) 30 MG 24 hr tablet Take 1 tablet (30 mg total) by mouth daily. 30 tablet 3  . promethazine (PHENERGAN) 25 MG tablet Take 1 tablet (25 mg total) by mouth every 8 (eight) hours as needed for nausea or vomiting. 60 tablet 0  No current facility-administered medications for this visit.    PAST MEDICAL HISTORY: Past Medical History  Diagnosis Date  . Hypertension   . Headache(784.0)   . Hypothyroidism   . Kidney stone   . PCOS (polycystic ovarian syndrome)   . Miscarriage   . Torticollis   . Gout     PAST SURGICAL HISTORY: Past Surgical History  Procedure Laterality Date  . Tonsillectomy   1990  . Wisdom tooth extraction  2006  . Lithotripsy      FAMILY HISTORY: Family History  Problem Relation Age of Onset  . Lung cancer Paternal Grandfather     smoker  . Heart attack Paternal Grandmother   . Heart attack Paternal Grandfather   . Heart attack Maternal Grandfather   . Hypertension Father   . Gout Father   . Depression Mother     SOCIAL HISTORY:  History   Social History  . Marital Status: Married    Spouse Name: Ed  . Number of Children: 0  . Years of Education: college   Occupational History  . 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 Smoking status: Never Smoker   . Smokeless tobacco: Never Used  . Alcohol Use: No  . Drug Use: No  . Sexual Activity:    Partners: Male   Other Topics Concern  . Not on file   Social History Narrative   1 cup caffeine/day   Right-handed.   Lives at home with husband.     PHYSICAL EXAM   Filed Vitals:   12/02/14 1117  BP: 132/101  Pulse: 94  Height: 5\' 3"  (1.6 m)  Weight: 244 lb (110.678 kg)    Not recorded      Body mass index is 43.23 kg/(m^2).  PHYSICAL EXAMNIATION:  Gen: NAD, conversant, well nourised, obese, well groomed                     Cardiovascular: Regular rate rhythm, no peripheral edema, warm, nontender. Eyes: Conjunctivae clear without exudates or hemorrhage Neck: Supple, no carotid bruise. Pulmonary: Clear to auscultation bilaterally   NEUROLOGICAL EXAM:  MENTAL STATUS: She has almost constant neck turning to the left side, intermittent neck jerking movement, variable pattern, tends to have right shoulder elevations, full range of motion of her neck  Speech:    Speech is normal; fluent and spontaneous with normal comprehension.  Cognition:    The patient is oriented to person, place, and time;     recent and remote memory intact;     language fluent;     normal attention, concentration,     fund of knowledge.  CRANIAL NERVES: CN II: Visual fields are full  to confrontation. Fundoscopic exam is normal with sharp discs and no vascular changes. Venous pulsations are present bilaterally. Pupils are 4 mm and briskly reactive to light. Visual acuity is 20/20 bilaterally. CN III, IV, VI: extraocular movement are normal. No ptosis. CN V: Facial sensation is intact to pinprick in all 3 divisions bilaterally. Corneal responses are intact.  CN VII: Face is symmetric with normal eye closure and smile. CN VIII: Hearing is normal to rubbing fingers CN IX, X: Palate elevates symmetrically. Phonation is normal. CN XI: Head turning and shoulder shrug are intact CN XII: Tongue is midline with normal movements and no atrophy. Constant left turning to the left side, catching movement when moving her neck to right side.  MOTOR: There is no pronator  drift of out-stretched arms. Muscle bulk and tone are normal. Muscle strength is normal.   Shoulder abduction Shoulder external rotation Elbow flexion Elbow extension Wrist flexion Wrist extension Finger abduction Hip flexion Knee flexion Knee extension Ankle dorsi flexion Ankle plantar flexion  R 5 5 5 5 5 5 5 5 5 5 5 5   L 5 5 5 5 5 5 5 5 5 5 5 5     REFLEXES: Reflexes are 2+ and symmetric at the biceps, triceps, knees, and ankles. Plantar responses are flexor.  SENSORY: Light touch, pinprick, position sense, and vibration sense are intact in fingers and toes.  COORDINATION: Rapid alternating movements and fine finger movements are intact. There is no dysmetria on finger-to-nose and heel-knee-shin. There are no abnormal or extraneous movements.   GAIT/STANCE: Posture is normal. Gait is steady with normal steps, base, arm swing, and turning. Heel and toe walking are normal. Tandem gait is normal.  Romberg is absent.   DIAGNOSTIC DATA (LABS, IMAGING, TESTING) - I reviewed patient records, labs, notes, testing and imaging myself where available.  Lab Results  Component Value Date   WBC 14.3* 09/12/2013   HGB  15.0 09/12/2013   HCT 42.6 09/12/2013   MCV 90.8 09/12/2013   PLT 361 09/12/2013      Component Value Date/Time   NA 135 05/25/2014 1026   NA 141 06/02/2011   K 4.5 05/25/2014 1026   CL 105 05/25/2014 1026   CO2 26 05/25/2014 1026   GLUCOSE 105* 05/25/2014 1026   BUN 9 05/25/2014 1026   BUN 11 06/02/2011   CREATININE 0.98 09/28/2014 0820   CREATININE 0.86 05/25/2014 1026   CREATININE 0.8 06/02/2011   CALCIUM 9.2 05/25/2014 1026   PROT 6.5 05/25/2014 1026   ALBUMIN 4.2 05/25/2014 1026   AST 40* 05/25/2014 1026   ALT 35 05/25/2014 1026   ALKPHOS 74 05/25/2014 1026   BILITOT 0.3 05/25/2014 1026   GFRNONAA 76* 09/28/2014 0820   GFRNONAA >89 05/25/2014 1026   GFRAA 88* 09/28/2014 0820   GFRAA >89 05/25/2014 1026   Lab Results  Component Value Date   CHOL 224* 07/22/2012   HDL 33* 07/22/2012   LDLCALC 137* 07/22/2012   TRIG 271* 07/22/2012   CHOLHDL 6.8 07/22/2012   Lab Results  Component Value Date   HGBA1C 5.9 11/29/2012   No results found for: [id number] Lab Results  Component Value Date   TSH 5.489* 05/25/2014     ASSESSMENT AND PLAN  LYNLEIGH KOVACK is a 33 y.o. 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 Ph.D.  [facility] [address] [place], [place] [address] Ph: [phone] Fax: [phone]

## 2014-12-04 ENCOUNTER — Telehealth: Payer: Self-pay | Admitting: *Deleted

## 2014-12-04 NOTE — Telephone Encounter (Signed)
Patient form from [employer] [place] on [employer].

## 2014-12-07 DIAGNOSIS — Z0289 Encounter for other administrative examinations: Secondary | ICD-10-CM

## 2014-12-08 ENCOUNTER — Telehealth: Payer: Self-pay | Admitting: Neurology

## 2014-12-08 ENCOUNTER — Encounter: Payer: Self-pay | Admitting: *Deleted

## 2014-12-08 NOTE — Telephone Encounter (Signed)
Patient is calling and states that she needs a letter stating that she cannot return to work sent to [employer] [phone]. Thanks!

## 2014-12-08 NOTE — Telephone Encounter (Signed)
Completed and faxed to [employer] per patient request.

## 2014-12-10 ENCOUNTER — Telehealth: Payer: Self-pay | Admitting: *Deleted

## 2014-12-10 NOTE — Telephone Encounter (Signed)
Patient form faxed to [employer] [place] on 12/514.

## 2014-12-14 ENCOUNTER — Telehealth: Payer: Self-pay | Admitting: *Deleted

## 2014-12-14 ENCOUNTER — Ambulatory Visit: Payer: BC Managed Care – PPO | Admitting: Family Medicine

## 2014-12-14 DIAGNOSIS — Z0289 Encounter for other administrative examinations: Secondary | ICD-10-CM

## 2014-12-14 NOTE — Telephone Encounter (Signed)
Patient form on [employer].

## 2014-12-16 ENCOUNTER — Telehealth: Payer: Self-pay | Admitting: *Deleted

## 2014-12-16 NOTE — Telephone Encounter (Signed)
Patient form faxed to [place] on 12/15/14.

## 2014-12-18 ENCOUNTER — Ambulatory Visit (INDEPENDENT_AMBULATORY_CARE_PROVIDER_SITE_OTHER): Payer: BC Managed Care – PPO | Admitting: Family Medicine

## 2014-12-18 ENCOUNTER — Encounter: Payer: Self-pay | Admitting: Family Medicine

## 2014-12-18 VITALS — BP 120/81 | HR 98 | Ht 63.0 in | Wt 243.0 lb

## 2014-12-18 DIAGNOSIS — I10 Essential (primary) hypertension: Secondary | ICD-10-CM

## 2014-12-18 DIAGNOSIS — E038 Other specified hypothyroidism: Secondary | ICD-10-CM | POA: Diagnosis not present

## 2014-12-18 DIAGNOSIS — M1000 Idiopathic gout, unspecified site: Secondary | ICD-10-CM

## 2014-12-18 DIAGNOSIS — E8881 Metabolic syndrome: Secondary | ICD-10-CM

## 2014-12-18 DIAGNOSIS — Z114 Encounter for screening for human immunodeficiency virus [HIV]: Secondary | ICD-10-CM

## 2014-12-18 DIAGNOSIS — E034 Atrophy of thyroid (acquired): Secondary | ICD-10-CM

## 2014-12-18 DIAGNOSIS — R42 Dizziness and giddiness: Secondary | ICD-10-CM

## 2014-12-18 LAB — POCT GLYCOSYLATED HEMOGLOBIN (HGB A1C): Hemoglobin A1C: 6.2

## 2014-12-18 MED ORDER — ALLOPURINOL 300 MG PO TABS: 300.0000 mg | ORAL_TABLET | Freq: Every day | ORAL | Status: DC

## 2014-12-18 NOTE — Progress Notes (Signed)
   Subjective:    Patient ID: Brenda Sharp, female    DOB: 06-05-1981, 34 y.o.   MRN: [id number]  HPI Gout - has had one flare in the last 2 weeks with the gout.  We had restarted allopurinol last OV since she has put getting pregnant on hold with her other issues. She is having problems with cervical dystonia.    IFG - she has lost 1 lb.  BP is well controlled.    Hypertension- Pt denies chest pain, SOB, dizziness, or heart palpitations.  Taking meds as directed w/o problems.  Denies medication side effects.     Review of Systems     Objective:   Physical Exam  Constitutional: She is oriented to person, place, and time. She appears well-developed and well-nourished.  HENT:  Head: Normocephalic and atraumatic.  Cardiovascular: Normal rate, regular rhythm and normal heart sounds.   Pulmonary/Chest: Effort normal and breath sounds normal.  Neurological: She is alert and oriented to person, place, and time.  Skin: Skin is warm and dry.  Psychiatric: She has a normal mood and affect. Her behavior is normal.          Assessment & Plan:  Gout - Increase to the 300mg  tab.   This was her previous dose.  F/Uin 6 m. Will recheck uric acid at that time.    Hypothyroid - Recheck TSH. She feels like her sxs are well controlled.   IFG -  Well controlled. A1C of 6.2 today. Call if any problems. Discussed getting back on track with diet and exercise. Discussed avoiding concentrated sweets and watching portion sizes on carbohydrates intake. I like to see her get her A1c back under 5.9. Unfortunately we really had not tracked over the last year and that her fall. But we will try to get back on top of this. Also encouraged her to engage in some type of regular exercise which she is not currently doing. Lab Results  Component Value Date   HGBA1C 6.2 12/18/2014    HTN - well controlled. F/U in 6 mo.

## 2014-12-24 ENCOUNTER — Other Ambulatory Visit: Payer: Self-pay | Admitting: Physician Assistant

## 2014-12-25 ENCOUNTER — Ambulatory Visit: Payer: BC Managed Care – PPO | Admitting: Rehabilitative and Restorative Service Providers"

## 2014-12-25 ENCOUNTER — Ambulatory Visit: Payer: BC Managed Care – PPO | Attending: Neurology | Admitting: Rehabilitative and Restorative Service Providers"

## 2014-12-25 DIAGNOSIS — R293 Abnormal posture: Secondary | ICD-10-CM

## 2014-12-25 DIAGNOSIS — R42 Dizziness and giddiness: Secondary | ICD-10-CM | POA: Insufficient documentation

## 2014-12-25 NOTE — Patient Instructions (Signed)
Healthy Back - Shoulder Roll   Stand straight with arms relaxed at sides. Roll shoulders backward continuously. Do _10___ times, perform as needed.  This exercise can also be done one shoulder at a time.  Copyright  VHI. All rights reserved.  AROM: Neck Rotation   Turn head slowly to look over one shoulder, then the other STOP IN THE MIDDLE IF YOU CAN*. Hold each position __3-5__ seconds. Repeat _5___ times per set. Do __1__ sets per session. Do __2__ sessions per day.  http://orth.exer.us/295   Copyright  VHI. All rights reserved.  Head Motion: Up and Down   Sitting TRYING TO LOOK TO THE RIGHT, slowly move head up with eyes open.  Then, move head down.  Repeat __5__ times per session. Do __2__ sessions per day.  Copyright  VHI. All rights reserved.    Walking Program:  Begin walking for exercise for 10 minutes, 1-2 times/day, 5 days/week.   Progress your walking program by adding 2 minutes to your routine each week, as tolerated. Be sure to wear good walking shoes, walk in a safe environment and only progress to your tolerance.

## 2014-12-25 NOTE — Therapy (Signed)
[facility] [address] [place], [place], [address] Phone: [phone]   Fax:  [phone]  Physical Therapy Evaluation  Patient Details  Name: Brenda Sharp MRN: [id number] Date of Birth: 22-Aug-1980 Referring Provider:  Marcial Pacas, MD  Encounter Date: 12/25/2014      PT End of Session - 12/25/14 1522    Visit Number 1   Number of Visits 8   Date for PT Re-Evaluation 01/25/15   Authorization Type BCBS, no visit limit   PT Start Time 1450   PT Stop Time 1530   PT Time Calculation (min) 40 min   Activity Tolerance Patient tolerated treatment well   Behavior During Therapy [facility] for tasks assessed/performed      Past Medical History  Diagnosis Date  . Hypertension   . Headache(784.0)   . Hypothyroidism   . Kidney stone   . PCOS (polycystic ovarian syndrome)   . Miscarriage   . Torticollis   . Gout     Past Surgical History  Procedure Laterality Date  . Tonsillectomy  1990  . Wisdom tooth extraction  2006  . Lithotripsy      There were no vitals filed for this visit.  Visit Diagnosis:  Posture abnormality      Subjective Assessment - 12/25/14 1456    Subjective Patient is known to our clinic from prior therapy for vertigo.  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   Her cc: neck turning to the left with muscle spasms, neck pain, with continued dizziness (from trying to focus with neck movements).   Patient Stated Goals be able to look straight ahead and hold head in that position   Currently in Pain? Yes   Pain Score 5    Pain Location Neck   Pain Orientation Posterior;Left   Pain Descriptors / Indicators Headache;Sore;Aching   Pain Type Chronic pain  began in January   Pain Onset More than a month ago   Pain Frequency Constant   Aggravating Factors  teaching and on her feet   Pain Relieving Factors nothing             [facility] PT Assessment - 12/25/14 1501    Assessment   Medical Diagnosis neck pain, abnormal neck posturing, psychogenic movement disorder   Onset Date --  08/2014   Prior Therapy for vertigo at this clinic   Precautions   Precautions Fall   Balance Screen   Has the patient fallen in the past 6 months Yes   How many times? --  several due to neck posturing, not being able to see   Has the patient had a decrease in activity level because of a fear of falling?  Yes   Is the patient reluctant to leave their home because of a fear of falling?  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, medical leave currently   Sensation   Light Touch --  4th and 5th digit on left hand numb, thinks ulnar nerve compressed from holding head in neutral position   Posture/Postural Control  Posture/Postural Control Postural limitations   Postural Limitations Rounded Shoulders;Forward head  head maintained in left rotation with frequent spasms   ROM / Strength   AROM / PROM / Strength AROM;Strength   AROM   Overall AROM  Within functional limits for tasks performed   Overall AROM Comments for UEs, neck rotation, neck flexion, neck extension, neck sidebending   Strength   Overall Strength Within functional limits for tasks performed   Palpation   Palpation tender to palpation over suboccipitals, L upper trap, L levator scapulae, and extending down into L parascapular musculature      THERAPEUTIC EXERCISE: HEP as listed in patient education- pt performs with cues/instruction.          PT Education - 12/25/14 1522    Education provided Yes   Education Details HEP: neck  A/ROM rotation, right rotation with flexion/extension, shoulder circles, walking program   Person(s) Educated Patient   Methods Explanation;Demonstration;Handout   Comprehension Verbalized understanding;Returned demonstration          PT Short Term Goals - 07/13/14 1700    PT SHORT TERM GOAL #1   Title same as LTG's           PT Long Term Goals - 12/25/14 1531    PT LONG TERM GOAL #1   Title The patient will return demo HEP for neck stretching/flexibility.   Baseline Target date 01/25/2015   Time 4   Period Weeks   PT LONG TERM GOAL #2   Title The patient will return demo HEP for postural stabilization.   Baseline Target date 01/25/2015   Time 4   Period Weeks   PT LONG TERM GOAL #3   Title The patient will be independent with home walking program.   Baseline Target date 01/25/2015   Time 4   Period Weeks   PT LONG TERM GOAL #4   Title The patient will be able to maintain head looking forward x 2 minutes nonstop.   Baseline Target date 01/25/2015   Time 4   Period Weeks               Plan - 12/25/14 1533    Clinical Impression Statement The patient is a 34 yo female with onset of vertigo, muscle spasms and neck involuntary movements in late 2015, early 2016.  She presents today with full A/ROM, however maintains L rotation >75% of evaluation.  She is unable to maintain head in neutral, straight position on command.  PT to address abnormal movement patterns with home program for stretching, stabilization and general wellness program to increase activity level.   Pt will benefit from skilled therapeutic intervention in order to improve on the following deficits Decreased activity tolerance;Postural dysfunction;Impaired tone;Increased muscle spasms;Pain   Rehab Potential Good   PT Frequency 2x / week   PT Duration 8 weeks   PT Treatment/Interventions Cryotherapy;Therapeutic exercise;Therapeutic activities;Patient/family education;Manual techniques;Functional mobility  training;Moist Heat;Biofeedback   PT Next Visit Plan check HEP, ? try biofeedback and relaxation techniques, postural stabilization and strengthening   Consulted and Agree with Plan of Care Patient         Problem List Patient Active Problem List   Diagnosis Date Noted  . Migraine headache 10/14/2014  . Depression 07/09/2014  . Severe obesity (BMI >= 40) 06/11/2014  . Hypothyroidism 08/21/2013  . Fatty liver 10/18/2012  . Essential hypertension, benign 09/27/2012  . Other and unspecified hyperlipidemia 12/01/2011  . Obesity 10/11/2011  . Insulin resistance 10/11/2011  . Gout  06/08/2011  Thank you for the referral of this patient.   [place], [place] 12/25/2014, 3:36 PM  [facility] [address] [place], [place], [address] Phone: [phone]   Fax:  [phone]

## 2015-01-01 ENCOUNTER — Ambulatory Visit: Payer: BC Managed Care – PPO | Admitting: Rehabilitative and Restorative Service Providers"

## 2015-01-01 DIAGNOSIS — R293 Abnormal posture: Secondary | ICD-10-CM | POA: Diagnosis not present

## 2015-01-01 DIAGNOSIS — R42 Dizziness and giddiness: Secondary | ICD-10-CM

## 2015-01-01 NOTE — Therapy (Signed)
[facility] [address] [place], [place], [address] Phone: [phone]   Fax:  [phone]  Physical Therapy Treatment  Patient Details  Name: Brenda Sharp MRN: [id number] Date of Birth: 1981/05/25 Referring Provider:  Hali Marry, *  Encounter Date: 01/01/2015      PT End of Session - 01/01/15 0950    Visit Number 2   Number of Visits 8   Date for PT Re-Evaluation 01/25/15   Authorization Type BCBS, no visit limit   PT Start Time 0934   PT Stop Time 1016   PT Time Calculation (min) 42 min   Activity Tolerance Patient tolerated treatment well   Behavior During Therapy [facility] for tasks assessed/performed      Past Medical History  Diagnosis Date  . Hypertension   . Headache(784.0)   . Hypothyroidism   . Kidney stone   . PCOS (polycystic ovarian syndrome)   . Miscarriage   . Torticollis   . Gout     Past Surgical History  Procedure Laterality Date  . Tonsillectomy  1990  . Wisdom tooth extraction  2006  . Lithotripsy      There were no vitals filed for this visit.  Visit Diagnosis:  Posture abnormality  Dizziness and giddiness      Subjective Assessment - 01/01/15 0934    Subjective The patient reports movements are about the same.  She reports 5-6/10 pain with home exercises provided.   Currently in Pain? Yes   Pain Score 4    Pain Location Neck   Pain Orientation Posterior;Left   Pain Descriptors / Indicators Sore   Pain Type Chronic pain   Pain Onset More than a month ago   Pain Frequency Constant   Aggravating Factors  stretches   Pain Relieving Factors nothing      THERAPEUTIC EXERCISE: Seated shoulder rolls Seated A/ROM neck rotation Isometric strengthening R rotation and R sidebending x 5 reps with self regulated resistance Sidelying gravity dependent stretching in R sidelying with towel roll under neck to lengthen L side Supine neck flexion x 4 reps Towel roll stretch  for anterior chest musculature Supine P/ROM into right rotation and sidebending Standing t-band red scap retraction with shoulder ER Standing shoulder extension with retraction cues with red t-band x 5 reps R/L Shoulder extension holding cane  Behind back to engage shoulder depression and retraction x 5 reps  SELF CARE/HOME MANAGEMENT: Sleeping positions discussed and demonstrated to maintain neck neutral position Recommended setting up environment to encourage R neck rotation (to watch TV and converse with family- as part of exercise)       PT Education - 01/01/15 1011    Education provided Yes   Education Details HEP: scapular retraction, shoulder extension, pec stretch   Person(s) Educated Patient   Methods Demonstration;Handout;Explanation   Comprehension Verbalized understanding;Returned demonstration           PT Long Term Goals - 12/25/14 1531    PT LONG TERM GOAL #1   Title The patient will return demo HEP for neck stretching/flexibility.   Baseline Target date 01/25/2015   Time 4   Period Weeks   PT LONG TERM GOAL #2   Title The patient will return demo HEP for postural stabilization.   Baseline Target date 01/25/2015   Time 4   Period Weeks   PT LONG TERM GOAL #3   Title The patient will be independent with home walking program.   Baseline Target date  01/25/2015   Time 4   Period Weeks   PT LONG TERM GOAL #4   Title The patient will be able to maintain head looking forward x 2 minutes nonstop.   Baseline Target date 01/25/2015   Time 4   Period Weeks           Plan - 01/01/15 1020    Clinical Impression Statement The patient tolerated treatment well with introduction of isometric strengthening and scapular stabilization.  Continue to STGs, LTGs.   PT Next Visit Plan check HEP, ? try biofeedback and relaxation techniques, postural stabilization and strengthening   Consulted and Agree with Plan of Care Patient        Problem List Patient Active  Problem List   Diagnosis Date Noted  . Migraine headache 10/14/2014  . Depression 07/09/2014  . Severe obesity (BMI >= 40) 06/11/2014  . Hypothyroidism 08/21/2013  . Fatty liver 10/18/2012  . Essential hypertension, benign 09/27/2012  . Other and unspecified hyperlipidemia 12/01/2011  . Obesity 10/11/2011  . Insulin resistance 10/11/2011  . Gout 06/08/2011    Jennaya Pogue, PT 01/01/2015, 10:38 AM  [facility] [address] [place], [place], [address] Phone: [phone]   Fax:  [phone]

## 2015-01-01 NOTE — Patient Instructions (Addendum)
Scapular Retraction (Standing)   With arms at sides, pinch shoulder blades together.  *DO THIS THROUGHOUT THE DAY and especially during household tasks (like laundry, washing dishes, etc).  Try to maintain this position while standing.  http://orth.exer.us/945   Copyright  VHI. All rights reserved.  Cane Exercise: Extension   Stand holding cane behind back with both hands palm-up. Lift the cane away from body. Hold __3__ seconds. Repeat _5___ times. Do ___2_ sessions per day.  http://gt2.exer.us/84   Copyright  VHI. All rights reserved.  Pectoral Stretch   With arms behind doorjamb, gently lean forward. Stretch is felt across chest. Hold __20__ seconds. Repeat __3__ times. Do __2__ sessions per day. *try to keep space between your shoulders and your ears.   http://gt2.exer.us/33   Copyright  VHI. All rights reserved.

## 2015-01-13 ENCOUNTER — Ambulatory Visit: Payer: BC Managed Care – PPO | Attending: Neurology | Admitting: Rehabilitative and Restorative Service Providers"

## 2015-01-13 DIAGNOSIS — R293 Abnormal posture: Secondary | ICD-10-CM | POA: Diagnosis present

## 2015-01-13 NOTE — Therapy (Signed)
[facility] [address] [place], [place], [address] Phone: [phone]   Fax:  [phone]  Physical Therapy Treatment  Patient Details  Name: Brenda Sharp MRN: [id number] Date of Birth: [birth year] Referring Provider:  Dr. Marcial Pacas  Encounter Date: 01/13/2015      PT End of Session - 01/13/15 0841    Visit Number 3   Number of Visits 8   Date for PT Re-Evaluation 01/25/15   Authorization Type BCBS, no visit limit   PT Start Time 0804   PT Stop Time 0844   PT Time Calculation (min) 40 min   Activity Tolerance Patient tolerated treatment well   Behavior During Therapy [facility] for tasks assessed/performed      Past Medical History  Diagnosis Date  . Hypertension   . Headache(784.0)   . Hypothyroidism   . Kidney stone   . PCOS (polycystic ovarian syndrome)   . Miscarriage   . Torticollis   . Gout     Past Surgical History  Procedure Laterality Date  . Tonsillectomy  1990  . Wisdom tooth extraction  2006  . Lithotripsy      There were no vitals filed for this visit.  Visit Diagnosis:  Posture abnormality      Subjective Assessment - 01/13/15 0804    Subjective The patient reports that she is continuing to have pain in the R cervical region (upper trap).  She is doing HEP intermittently based on fatigue.  Pt scheduled to see neurologist at [facility] on 02/01/2015 for further assessment.   Currently in Pain? Yes   Pain Score 5    Pain Location Neck   Pain Orientation Right   Pain Descriptors / Indicators Aching   Pain Type Chronic pain   Pain Onset More than a month ago   Pain Frequency Constant   Aggravating Factors  riding in a car; ADLs   Pain Relieving Factors nothing      THERAPEUTIC EXERCISE: Supine postural stabilization rolling to clap R<>L x 10 reps Supine chin tucks with manual resistance Prone "superman" exercises UEs x 5 reps R/L and then UE+LE x 5 reps R/L Prone on elbows with reaching R  and then L UE x 5 reps Sidelying D1/D2 scapular pattern R/L in sidelying Left sidelying lifting R ear to R shoulder  Supine isometric R rotation  MANUAL: Supine gentle manual distraction and passive overpressure into rotation, flexion and sidebending Soft tissue mobilization of bilateral upper trap, levator, and scalenes        PT Short Term Goals - 07/13/14 1700    PT SHORT TERM GOAL #1   Title same as LTG's           PT Long Term Goals - 12/25/14 1531    PT LONG TERM GOAL #1   Title The patient will return demo HEP for neck stretching/flexibility.   Baseline Target date 01/25/2015   Time 4   Period Weeks   PT LONG TERM GOAL #2   Title The patient will return demo HEP for postural stabilization.   Baseline Target date 01/25/2015   Time 4   Period Weeks   PT LONG TERM GOAL #3   Title The patient will be independent with home walking program.   Baseline Target date 01/25/2015   Time 4   Period Weeks   PT LONG TERM GOAL #4   Title The patient will be able to maintain head looking forward x 2 minutes nonstop.  Baseline Target date 01/25/2015   Time 4   Period Weeks               Plan - 01/13/15 1359    Clinical Impression Statement The patient reports she is able to turn more to the right, but is not able to hold head looking straight ahead.  PT added isometric strengthening to home for rotation.   PT Next Visit Plan try biofeedback and relaxation techniques, postural stabilization and strengthening   Consulted and Agree with Plan of Care Patient        Problem List Patient Active Problem List   Diagnosis Date Noted  . Migraine headache 10/14/2014  . Depression 07/09/2014  . Severe obesity (BMI >= 40) 06/11/2014  . Hypothyroidism 08/21/2013  . Fatty liver 10/18/2012  . Essential hypertension, benign 09/27/2012  . Other and unspecified hyperlipidemia 12/01/2011  . Obesity 10/11/2011  . Insulin resistance 10/11/2011  . Gout 06/08/2011     Machele Deihl, PT 01/13/2015, 2:01 PM  [facility] [address] [place], [place], [address] Phone: [phone]   Fax:  [phone]

## 2015-01-13 NOTE — Patient Instructions (Signed)
Isometric Rotation   Put right finger on right side of head. Gently try to turn head to right, pushing against fingers. Hold __5__ seconds.  Push and release slowly. Repeat __5__ times. Do __2__ sessions per day.  http://gt2.exer.us/25   Copyright  VHI. All rights reserved.

## 2015-01-14 ENCOUNTER — Telehealth: Payer: Self-pay | Admitting: Neurology

## 2015-01-14 NOTE — Telephone Encounter (Signed)
Tat pt, please call patient in Jade's absence / Sherri

## 2015-01-14 NOTE — Telephone Encounter (Signed)
Left msg on Kimberly's vm to return my call.

## 2015-01-14 NOTE — Telephone Encounter (Signed)
Joelene Millin with disability would like a call back in regards to the pt/Dawn CB# [phone] [id number]

## 2015-01-15 NOTE — Telephone Encounter (Signed)
Joelene Millin called in regards to pt, would like a call back @[phone]/Dawn  Reference claim # [id number]

## 2015-01-15 NOTE — Telephone Encounter (Signed)
LMOM for Brenda Sharp to make her aware we have no completed disability forms for patient. FMLA looks like it was done through Dr Madilyn Fireman. Gave her the number to our medical records department if she needs Dr Doristine Devoid or Dr Georgie Chard records [phone]). She is to call back if needed.

## 2015-01-18 ENCOUNTER — Ambulatory Visit: Payer: BC Managed Care – PPO | Admitting: Rehabilitative and Restorative Service Providers"

## 2015-01-18 DIAGNOSIS — R293 Abnormal posture: Secondary | ICD-10-CM

## 2015-01-18 NOTE — Therapy (Signed)
[facility] [address] [place], [place], [address] Phone: [phone]   Fax:  [phone]  Physical Therapy Treatment  Patient Details  Name: Brenda Sharp MRN: [id number] Date of Birth: 27-Oct-1980 Referring Provider:  Hali Marry, *  Encounter Date: 01/18/2015      PT End of Session - 01/18/15 2108    Visit Number 4   Number of Visits 8   Date for PT Re-Evaluation 01/25/15   Authorization Type BCBS, no visit limit   PT Start Time 0852   PT Stop Time 0933   PT Time Calculation (min) 41 min   Activity Tolerance Patient tolerated treatment well   Behavior During Therapy [facility] for tasks assessed/performed      Past Medical History  Diagnosis Date  . Hypertension   . Headache(784.0)   . Hypothyroidism   . Kidney stone   . PCOS (polycystic ovarian syndrome)   . Miscarriage   . Torticollis   . Gout     Past Surgical History  Procedure Laterality Date  . Tonsillectomy  1990  . Wisdom tooth extraction  2006  . Lithotripsy      There were no vitals filed for this visit.  Visit Diagnosis:  Posture abnormality      Subjective Assessment - 01/18/15 0856    Subjective The patient continues to feel that symptoms remain the same.  She notes some increased tightness/tenderness after PT that lasts x 24 hours.  She is dong HEP.   Currently in Pain? Yes   Pain Score 5    Pain Location Neck   Pain Orientation Right   Pain Descriptors / Indicators Aching;Sore;Tightness   Pain Type Chronic pain   Pain Onset More than a month ago   Pain Frequency Constant   Aggravating Factors  riding in a car   Pain Relieving Factors nothing      THERAPEUTIC EXERCISE: Seated on physioball postural stabilization reaching R and L UE into overhead flexion x 10 reps, reaching ball overhead with cues for scapular depression, and diagonal reaches in PNF D1 pattern with cues for scapular stabilization (depression and  retraction) x 10 reps  Doorframe stretch for anterior chest musculature Standing wall press up (minimal retraction/protraction in weight bearing position) x 8 reps Shoulder rolls x 5 reps x 2 sets Supine towel roll stretch x 2 minutes Quadriped UE extension alternating UEs x 8 reps each side Quadriped "thread the needle" to R and L sides x 5 reps adding trunk rotation to the opposite side  MANUAL: Supine passive overpressure into flexion and rotation R and L Supine soft tissue mobilization for suboccipitals, R parascapular and upper trapezius muscles. R sidelying for positioning for home to relax R upper trapezius muscles           PT Long Term Goals - 12/25/14 1531    PT LONG TERM GOAL #1   Title The patient will return demo HEP for neck stretching/flexibility.   Baseline Target date 01/25/2015   Time 4   Period Weeks   PT LONG TERM GOAL #2   Title The patient will return demo HEP for postural stabilization.   Baseline Target date 01/25/2015   Time 4   Period Weeks   PT LONG TERM GOAL #3   Title The patient will be independent with home walking program.   Baseline Target date 01/25/2015   Time 4   Period Weeks   PT LONG TERM GOAL #4   Title  The patient will be able to maintain head looking forward x 2 minutes nonstop.   Baseline Target date 01/25/2015   Time 4   Period Weeks               Plan - 01/18/15 2109    Clinical Impression Statement The patient is tolerating progressive strengthening for postural muscles, however day to day does not notice significant improvement.  PT will decrease to 1x/week x next 2 weeks to progress HEP, and know if further treatment indicated after [facility] MD visit in 2 weeks.   PT Next Visit Plan try biofeedback and relaxation techniques, postural stabilization and strengthening   Consulted and Agree with Plan of Care Patient        Problem List Patient Active Problem List   Diagnosis Date Noted  . Migraine headache 10/14/2014   . Depression 07/09/2014  . Severe obesity (BMI >= 40) 06/11/2014  . Hypothyroidism 08/21/2013  . Fatty liver 10/18/2012  . Essential hypertension, benign 09/27/2012  . Other and unspecified hyperlipidemia 12/01/2011  . Obesity 10/11/2011  . Insulin resistance 10/11/2011  . Gout 06/08/2011    Armon Orvis, PT 01/18/2015, 9:11 PM  [facility] [address] [place], [place], [address] Phone: [phone]   Fax:  [phone]

## 2015-01-18 NOTE — Patient Instructions (Signed)
When muscles are fatigued, try resting on your right side in sidelying with head positioned to look towards the bed. Note if this is a consistent way to allow right side muscles to relax.

## 2015-01-20 ENCOUNTER — Ambulatory Visit: Payer: BC Managed Care – PPO | Admitting: Rehabilitative and Restorative Service Providers"

## 2015-01-25 ENCOUNTER — Ambulatory Visit: Payer: BC Managed Care – PPO | Admitting: Rehabilitative and Restorative Service Providers"

## 2015-01-25 DIAGNOSIS — R293 Abnormal posture: Secondary | ICD-10-CM

## 2015-01-25 NOTE — Therapy (Signed)
[facility] [address] [place], [place], [address] Phone: [phone]   Fax:  [phone]  Physical Therapy Treatment  Patient Details  Name: KENITHA GLENDINNING MRN: [id number] Date of Birth: [birth year] Referring Provider:  Hali Marry, *  Encounter Date: 01/25/2015      PT End of Session - 01/25/15 1220    Visit Number 5   Number of Visits 8   Date for PT Re-Evaluation 01/25/15   Authorization Type BCBS, no visit limit   PT Start Time [id number]   PT Stop Time 1010   PT Time Calculation (min) 32 min   Activity Tolerance Patient tolerated treatment well   Behavior During Therapy [facility] for tasks assessed/performed      Past Medical History  Diagnosis Date  . Hypertension   . Headache(784.0)   . Hypothyroidism   . Kidney stone   . PCOS (polycystic ovarian syndrome)   . Miscarriage   . Torticollis   . Gout     Past Surgical History  Procedure Laterality Date  . Tonsillectomy  1990  . Wisdom tooth extraction  2006  . Lithotripsy      There were no vitals filed for this visit.  Visit Diagnosis:  Posture abnormality      Subjective Assessment - 01/25/15 0942    Subjective The patient continues with same symtpoms and does not feel like exercises are relieving R sided stiffness.  Patient maintains L head rotation.   Currently in Pain? Yes   Pain Score 7    Pain Location Neck   Pain Orientation Right   Pain Descriptors / Indicators Sharp;Aching   Pain Type Chronic pain   Pain Onset More than a month ago   Pain Frequency Constant   Aggravating Factors  riding in a car   Pain Relieving Factors nothing      THERAPEUTIC EXERCISE: Quadriped UE extension alternating R/L sides x 10 reps Quadriped trunk rotation R<>L sides with head movements Supine neck A/ROM with isometric holds at end range (with manual resistance) Reviewed shoulder circles HEP Discussed need to continue A/ROM  Reviewed seated  A/ROM  MANUAL: Manual soft tissue mobilization R upper trap, R scalenes, bilateral suboccipitals with patient's spasms appearing worse with manual stretching and soft tissue work. Only performed x 10 minutes due to worsening muscular resistance against movement.        PT Long Term Goals - 01/25/15 7017    PT LONG TERM GOAL #1   Title The patient will return demo HEP for neck stretching/flexibility.   Baseline Met on 01/25/2015   Time 4   Period Weeks   Status Achieved   PT LONG TERM GOAL #2   Title The patient will return demo HEP for postural stabilization.   Baseline Met on 01/25/2015   Time 4   Period Weeks   Status Achieved   PT LONG TERM GOAL #3   Title The patient will be independent with home walking program.   Baseline Met on 01/25/2015   Time 4   Period Weeks   Status Achieved   PT LONG TERM GOAL #4   Title The patient will be able to maintain head looking forward x 2 minutes nonstop.   Baseline Not met on 01/25/2015   Time 4   Period Weeks   Status Not Met               Plan - 01/25/15 1222    Clinical Impression Statement The  patient met 3 LTGs directed at self management of symptoms.  Although she does not note improvement in neck discomfort or ability to maintain head in neutral position, I encouraged continued performance of HEP in order to maintain ROM.   PT Next Visit Plan Discuss MD appt at [facility], write new goals at next visit, postural stretching   Consulted and Agree with Plan of Care Patient        Problem List Patient Active Problem List   Diagnosis Date Noted  . Migraine headache 10/14/2014  . Depression 07/09/2014  . Severe obesity (BMI >= 40) 06/11/2014  . Hypothyroidism 08/21/2013  . Fatty liver 10/18/2012  . Essential hypertension, benign 09/27/2012  . Other and unspecified hyperlipidemia 12/01/2011  . Obesity 10/11/2011  . Insulin resistance 10/11/2011  . Gout 06/08/2011    WEAVER,CHRISTINA, PT 01/25/2015, 12:25 PM  [facility] [address] [place], [place], [address] Phone: [phone]   Fax:  [phone]

## 2015-01-27 ENCOUNTER — Ambulatory Visit: Payer: BC Managed Care – PPO | Admitting: Rehabilitative and Restorative Service Providers"

## 2015-02-01 DIAGNOSIS — R259 Unspecified abnormal involuntary movements: Secondary | ICD-10-CM | POA: Insufficient documentation

## 2015-02-02 ENCOUNTER — Telehealth: Payer: Self-pay | Admitting: *Deleted

## 2015-02-02 ENCOUNTER — Other Ambulatory Visit: Payer: Self-pay | Admitting: Family Medicine

## 2015-02-02 ENCOUNTER — Ambulatory Visit: Payer: BC Managed Care – PPO | Admitting: Rehabilitative and Restorative Service Providers"

## 2015-02-02 NOTE — Telephone Encounter (Addendum)
Office note from 10/22/2014 faxed to [phone] with confirmation received.

## 2015-02-02 NOTE — Telephone Encounter (Signed)
Please call Edison Nasuti [phone] from disability [employer] in reference to patients claim

## 2015-02-02 NOTE — Telephone Encounter (Signed)
Joelene Millin with disability called and needs the office notes faxed to her in regards to pt's 10/22/2014 visit/Dawn CB#[id number] [id number] [phone]

## 2015-02-04 ENCOUNTER — Ambulatory Visit: Payer: BC Managed Care – PPO | Admitting: Rehabilitative and Restorative Service Providers"

## 2015-02-04 DIAGNOSIS — R293 Abnormal posture: Secondary | ICD-10-CM

## 2015-02-04 NOTE — Therapy (Signed)
[facility] [address] [place], [place], [address] Phone: [phone]   Fax:  [phone]  Physical Therapy Treatment  Patient Details  Name: Brenda Sharp MRN: [id number] Date of Birth: 03/06/1981 Referring Provider:  Hali Marry, *  Encounter Date: 02/04/2015      PT End of Session - 02/04/15 1046    Visit Number 6   Number of Visits 8   Date for PT Re-Evaluation 01/25/15   Authorization Type BCBS, no visit limit   PT Start Time 1018   PT Stop Time 1044   PT Time Calculation (min) 26 min   Activity Tolerance Patient tolerated treatment well   Behavior During Therapy [facility] for tasks assessed/performed      Past Medical History  Diagnosis Date  . Hypertension   . Headache(784.0)   . Hypothyroidism   . Kidney stone   . PCOS (polycystic ovarian syndrome)   . Miscarriage   . Torticollis   . Gout     Past Surgical History  Procedure Laterality Date  . Tonsillectomy  1990  . Wisdom tooth extraction  2006  . Lithotripsy      There were no vitals filed for this visit.  Visit Diagnosis:  Posture abnormality      Subjective Assessment - 02/04/15 1027    Subjective The patient was seen at [facility] and diagnosed with adult onset cervical dystonia.  She has not responded to oral meds and MD is seeking authorization for botox.  Patient was uncomfortable last night and could not get comfortable in sleeping positions.    Currently in Pain? Yes   Pain Score 8    Pain Location Neck   Pain Orientation Right;Left  right more intense than left   Pain Descriptors / Indicators Aching;Tightness   Pain Type Chronic pain   Pain Onset More than a month ago   Pain Frequency Constant   Aggravating Factors  riding in a car   Pain Relieving Factors nothing      THERAPEUTIC EXERCISE: Seated neck A/ROM 70 deg to the right and 80 deg to the left with worsening pain (R>L) Seated neck lateral flexion to approximately  30 deg to each direction (limitation in accuracy of measurement due to dystonia and movement quality)  SELF CARE/HOME MANAGEMENT: Discussed current progression of exercises for HEP Discussed performance of home tasks/cleaning and patient discusses challenging with increased time required Recommended patient hold PT until after getting botox to get optimal stretching/benefit from PT        PT Long Term Goals - 02/04/15 1023    PT LONG TERM GOAL #1   Title The patient will return demo HEP progression for neck stretching/flexibility.   Baseline Met on 01/25/2015   Time 4   Period Weeks   Status Revised   PT LONG TERM GOAL #2   Title The patient will return demo HEP for postural stabilization.   Baseline Met on 01/25/2015   Time 4   Period Weeks   Status Achieved   PT LONG TERM GOAL #3   Title The patient will be independent with home walking program.   Baseline Met on 01/25/2015   Time 4   Period Weeks   Status Achieved   PT LONG TERM GOAL #4   Title The patient will be able to maintain head looking forward x 2 minutes nonstop.   Baseline Re-established goal with target date of 03/06/2015 (awaiting botox appt)   Time 4  Period Weeks   Status Revised   PT LONG TERM GOAL #5   Title REduce pain R side from 7/10 to 4/10 with right rotation.   Baseline R rotation is 70 deg with pain on R side rated , L rotation is 80 deg   Time 4   Period Weeks   Status New   Additional Long Term Goals   Additional Long Term Goals --               Plan - 02/05/15 0829    Clinical Impression Statement The patient is awaiting botox injection for cervical dystonia.  PT recommends she continue current HEP and call to schedule for 10-14 days post botox to modify HEP as needed after patient receives further medical intervention for dystonia.  See updated goals.   PT Next Visit Plan Re-assess after botox to modify HEP and self management as needed.   Consulted and Agree with Plan of Care  Patient        Problem List Patient Active Problem List   Diagnosis Date Noted  . Migraine headache 10/14/2014  . Depression 07/09/2014  . Severe obesity (BMI >= 40) 06/11/2014  . Hypothyroidism 08/21/2013  . Fatty liver 10/18/2012  . Essential hypertension, benign 09/27/2012  . Other and unspecified hyperlipidemia 12/01/2011  . Obesity 10/11/2011  . Insulin resistance 10/11/2011  . Gout 06/08/2011    Jahmier Willadsen, PT 02/05/2015, 8:30 AM  [facility] [address] [place], [place], [address] Phone: [phone]   Fax:  [phone]

## 2015-02-09 ENCOUNTER — Telehealth: Payer: Self-pay | Admitting: *Deleted

## 2015-02-09 NOTE — Telephone Encounter (Signed)
Pt called and lvm stating that she was told by her neurologist @ [facility] that she will need to obtain pain meds from her pcp. Maryruth Eve, [place] Cro[place]

## 2015-02-09 NOTE — Telephone Encounter (Signed)
Called pt back and informed her that she would need to be seen to have pain medications prescribed. She was given an appt with Dr. Dianah Field for 02/11/15 @ 3pm to discuss.   Informed/provided Dr. Dianah Field of pt's hx and copy of OV notes from [facility].Brenda Sharp

## 2015-02-11 ENCOUNTER — Encounter: Payer: Self-pay | Admitting: Sports Medicine

## 2015-02-11 ENCOUNTER — Ambulatory Visit (INDEPENDENT_AMBULATORY_CARE_PROVIDER_SITE_OTHER): Payer: BC Managed Care – PPO | Admitting: Sports Medicine

## 2015-02-11 VITALS — BP 163/105 | HR 114 | Ht 63.0 in | Wt 245.0 lb

## 2015-02-11 DIAGNOSIS — G243 Spasmodic torticollis: Secondary | ICD-10-CM

## 2015-02-11 MED ORDER — CYCLOBENZAPRINE HCL 10 MG PO TABS: ORAL_TABLET | ORAL | Status: DC

## 2015-02-11 MED ORDER — TRAMADOL HCL 50 MG PO TABS: ORAL_TABLET | ORAL | Status: DC

## 2015-02-11 MED ORDER — MELOXICAM 15 MG PO TABS: ORAL_TABLET | ORAL | Status: DC

## 2015-02-11 NOTE — Progress Notes (Signed)
   Subjective:    I'm seeing this patient as a consultation for:  Dr. Madilyn Fireman  CC: neck pain  HPI: For sometime this pleasant 34 year old female has had spasm in her neck forcefully rotating into the left, she has seen neurology, has a negative cervical spine MRI, she has had failure of multiple oral medications in the past. Next step is Botox injections, she has been started on a couple of movement disorder medications without any improvement. She does have significant neckpain and a great deal of time until she can get in with the neurologist.  she is referred to me for further evaluation and definitive treatment in the meantime.  Pain is moderate, persistent.  Past medical history, Surgical history, Family history not pertinant except as noted below, Social history, Allergies, and medications have been entered into the medical record, reviewed, and no changes needed.   Review of Systems: No headache, visual changes, nausea, vomiting, diarrhea, constipation, dizziness, abdominal pain, skin rash, fevers, chills, night sweats, weight loss, swollen lymph nodes, body aches, joint swelling, muscle aches, chest pain, shortness of breath, mood changes, visual or auditory hallucinations.   Objective:   General: Well Developed, well nourished, and in no acute distress.  Neuro/Psych: Alert and oriented x3, extra-ocular muscles intact, able to move all 4 extremities, sensation grossly intact. Skin: Warm and dry, no rashes noted.  Respiratory: Not using accessory muscles, speaking in full sentences, trachea midline.  Cardiovascular: Pulses palpable, no extremity edema. Abdomen: Does not appear distended. Neck: Negative spurling's Full neck range of motion, however held in a leftward deviation, minimal tenderness over the right sternocleidomastoid, patient seems to be able to turn her neck to the right acceptably. Grip strength and sensation normal in bilateral hands Strength good C4 to T1  distribution No sensory change to C4 to T1 Reflexes normal  Impression and Recommendations:   This case required medical decision making of moderate complexity.

## 2015-02-11 NOTE — Assessment & Plan Note (Signed)
With left-sided abnormal neck posturing. There is some suspicion that this is psychogenic. She does have an appointment coming up with the movement disorder specialist at [facility], and consideration of Botox injections. She has already been through multiple medications including Xanax, Valium, baclofen, Lyrica, amitriptyline, she has already wore a cervical collar without any improvement.  We are going to try meloxicam, tramadol, and Flexeril, she will call me in 2 weeks, if Flexeril fails we can try Zanaflex, and then Robaxin, and then Skelaxin. I do think we should avoid habit-forming medication such as soma.

## 2015-02-12 ENCOUNTER — Other Ambulatory Visit: Payer: Self-pay | Admitting: Family Medicine

## 2015-02-25 ENCOUNTER — Ambulatory Visit: Payer: BC Managed Care – PPO | Admitting: Sports Medicine

## 2015-03-03 ENCOUNTER — Other Ambulatory Visit: Payer: Self-pay | Admitting: Sports Medicine

## 2015-03-03 ENCOUNTER — Other Ambulatory Visit: Payer: Self-pay | Admitting: Family Medicine

## 2015-03-04 ENCOUNTER — Other Ambulatory Visit: Payer: Self-pay | Admitting: Family Medicine

## 2015-03-04 ENCOUNTER — Other Ambulatory Visit: Payer: Self-pay | Admitting: Sports Medicine

## 2015-03-05 ENCOUNTER — Other Ambulatory Visit: Payer: Self-pay | Admitting: *Deleted

## 2015-03-05 DIAGNOSIS — G243 Spasmodic torticollis: Secondary | ICD-10-CM

## 2015-03-05 MED ORDER — NIFEDIPINE ER OSMOTIC RELEASE 30 MG PO TB24: 30.0000 mg | ORAL_TABLET | Freq: Every day | ORAL | Status: DC

## 2015-03-05 MED ORDER — TRAMADOL HCL 50 MG PO TABS: ORAL_TABLET | ORAL | Status: DC

## 2015-03-08 ENCOUNTER — Ambulatory Visit: Payer: BC Managed Care – PPO | Admitting: Neurology

## 2015-03-17 ENCOUNTER — Other Ambulatory Visit: Payer: Self-pay | Admitting: Sports Medicine

## 2015-03-26 ENCOUNTER — Other Ambulatory Visit: Payer: Self-pay | Admitting: Family Medicine

## 2015-04-05 ENCOUNTER — Other Ambulatory Visit: Payer: Self-pay | Admitting: *Deleted

## 2015-04-05 DIAGNOSIS — G243 Spasmodic torticollis: Secondary | ICD-10-CM

## 2015-04-05 MED ORDER — TRAMADOL HCL 50 MG PO TABS: ORAL_TABLET | ORAL | Status: DC

## 2015-04-05 NOTE — Telephone Encounter (Signed)
Refill of tramadol.Brenda Sharp

## 2015-05-05 ENCOUNTER — Ambulatory Visit (INDEPENDENT_AMBULATORY_CARE_PROVIDER_SITE_OTHER): Payer: BC Managed Care – PPO | Admitting: Family Medicine

## 2015-05-05 ENCOUNTER — Other Ambulatory Visit: Payer: Self-pay | Admitting: Sports Medicine

## 2015-05-05 ENCOUNTER — Encounter: Payer: Self-pay | Admitting: Family Medicine

## 2015-05-05 VITALS — BP 140/109 | HR 100 | Temp 98.3°F | Wt 251.0 lb

## 2015-05-05 DIAGNOSIS — M1A09X0 Idiopathic chronic gout, multiple sites, without tophus (tophi): Secondary | ICD-10-CM | POA: Diagnosis not present

## 2015-05-05 DIAGNOSIS — Z23 Encounter for immunization: Secondary | ICD-10-CM

## 2015-05-05 LAB — CBC WITH DIFFERENTIAL/PLATELET
BASOS ABS: 0.1 10*3/uL (ref 0.0–0.1)
Basophils Relative: 1 % (ref 0–1)
EOS PCT: 4 % (ref 0–5)
Eosinophils Absolute: 0.4 10*3/uL (ref 0.0–0.7)
HCT: 42.4 % (ref 36.0–46.0)
Hemoglobin: 14.9 g/dL (ref 12.0–15.0)
LYMPHS ABS: 3.8 10*3/uL (ref 0.7–4.0)
LYMPHS PCT: 41 % (ref 12–46)
MCH: 32 pg (ref 26.0–34.0)
MCHC: 35.1 g/dL (ref 30.0–36.0)
MCV: 91 fL (ref 78.0–100.0)
MPV: 9.1 fL (ref 8.6–12.4)
Monocytes Absolute: 0.6 10*3/uL (ref 0.1–1.0)
Monocytes Relative: 6 % (ref 3–12)
NEUTROS PCT: 48 % (ref 43–77)
Neutro Abs: 4.5 10*3/uL (ref 1.7–7.7)
Platelets: 302 10*3/uL (ref 150–400)
RBC: 4.66 MIL/uL (ref 3.87–5.11)
RDW: 14.8 % (ref 11.5–15.5)
WBC: 9.3 10*3/uL (ref 4.0–10.5)

## 2015-05-05 MED ORDER — DULOXETINE HCL 60 MG PO CPEP: 60.0000 mg | ORAL_CAPSULE | Freq: Two times a day (BID) | ORAL | Status: DC

## 2015-05-05 MED ORDER — PREDNISONE 10 MG PO TABS: ORAL_TABLET | ORAL | Status: DC

## 2015-05-05 NOTE — Progress Notes (Signed)
   Subjective:    Patient ID: Brenda Sharp, female    DOB: 09-20-1980, 34 y.o.   MRN: [id number]  HPI  follow-up gout-she is concerned because the colchicine is making her nauseated. About once a month is having a flare and then will have to take 3 colchicine for her flare and will get nausea/vomiting and diarrhea. Would like an alternative.     She's also concerned because she feels that the tramadol was not really controlling her pain well.  When she first started Cymbalta she noticed a big difference in pain control but jst feels like the last few weeks she's had a more difficult time. She has started the Botox injections and has 2 more sets, 3 months apart from each other scheduled.  Review of Systems     Objective:   Physical Exam  Constitutional: She is oriented to person, place, and time. She appears well-developed and well-nourished.  HENT:  Head: Normocephalic and atraumatic.  Eyes: Conjunctivae and EOM are normal.  Cardiovascular: Normal rate.   Pulmonary/Chest: Effort normal.  Neurological: She is alert and oriented to person, place, and time.  Skin: Skin is dry. No pallor.  Psychiatric: She has a normal mood and affect. Her behavior is normal.          Assessment & Plan:   gout-we will recheck uric acid level. Continue with 3 mg about her nd off for now. Also want her to continue her daily colchicine until the middle of November at that point she can come off. She is able to tolerate the once daily dose is just when she takes multiple doses. For now I will give her a perception for prednisone to use short-term when she is experiencing a flare but explained that this is not a long-term solution. Hopefully when she's on the daily colchicine and we can just use that for rescue consider taking 3 tabs in a short period time she can just take 1 twice a day until she starts to get some relief and then go down to once a day. Studies show that this is actually equally affective  intense to cause less GI upset.  Musculoskeletal pain/spasmodic torticullis - Inc cymbalta to 60mg  BID.  Follow-up in 3 months.   Discussed additional options as well. We could consider changing her instead to a different NSAID. Sometimes I can provide some extra relief. Or even considering pain management if the tramadol is not being effective as her rescue medicine.  Flu vaccine given today.

## 2015-05-06 LAB — T4, FREE: FREE T4: 1.11 ng/dL (ref 0.80–1.80)

## 2015-05-06 LAB — TSH: TSH: 1.679 u[IU]/mL (ref 0.350–4.500)

## 2015-05-06 LAB — COMPLETE METABOLIC PANEL WITH GFR
ALT: 53 U/L — AB (ref 6–29)
AST: 40 U/L — ABNORMAL HIGH (ref 10–30)
Albumin: 4.4 g/dL (ref 3.6–5.1)
Alkaline Phosphatase: 74 U/L (ref 33–115)
BUN: 9 mg/dL (ref 7–25)
CHLORIDE: 104 mmol/L (ref 98–110)
CO2: 26 mmol/L (ref 20–31)
Calcium: 9.3 mg/dL (ref 8.6–10.2)
Creat: 0.85 mg/dL (ref 0.50–1.10)
GFR, Est Non African American: >89 mL/min (ref >=60)
Glucose, Bld: 155 mg/dL — ABNORMAL HIGH (ref 65–99)
Potassium: 4.1 mmol/L (ref 3.5–5.3)
Sodium: 139 mmol/L (ref 135–146)
Total Bilirubin: 0.4 mg/dL (ref 0.2–1.2)
Total Protein: 6.5 g/dL (ref 6.1–8.1)

## 2015-05-06 LAB — T3, FREE: T3, Free: 2.9 pg/mL (ref 2.3–4.2)

## 2015-05-06 LAB — URIC ACID: Uric Acid, Serum: 10.6 mg/dL — ABNORMAL HIGH (ref 2.4–7.0)

## 2015-05-06 LAB — HIV ANTIBODY (ROUTINE TESTING W REFLEX): HIV 1&2 Ab, 4th Generation: NONREACTIVE

## 2015-05-09 ENCOUNTER — Other Ambulatory Visit: Payer: Self-pay | Admitting: Family Medicine

## 2015-05-24 ENCOUNTER — Telehealth: Payer: Self-pay

## 2015-05-24 DIAGNOSIS — M62838 Other muscle spasm: Secondary | ICD-10-CM

## 2015-05-24 DIAGNOSIS — M109 Gout, unspecified: Secondary | ICD-10-CM

## 2015-05-24 NOTE — Telephone Encounter (Signed)
Pt would like to have a referral put in for Pain management. Please assist.

## 2015-05-25 NOTE — Telephone Encounter (Signed)
Referral placed.

## 2015-05-25 NOTE — Addendum Note (Signed)
Addended by: Beatrice Lecher D on: 05/25/2015 09:43 AM   Modules accepted: Orders

## 2015-05-25 NOTE — Telephone Encounter (Signed)
Pt.notified

## 2015-06-01 ENCOUNTER — Other Ambulatory Visit: Payer: Self-pay | Admitting: Family Medicine

## 2015-06-03 ENCOUNTER — Encounter: Payer: Self-pay | Admitting: Rehabilitative and Restorative Service Providers"

## 2015-06-03 NOTE — Therapy (Signed)
[facility] [address] [place], [place], [address] Phone: [phone]   Fax:  [phone]  Patient Details  Name: Brenda Sharp MRN: [id number] Date of Birth: November 14, 1980 Referring Provider:  No ref. provider found  Encounter Date: last encounter 02/04/2015  PHYSICAL THERAPY DISCHARGE SUMMARY  Visits from Start of Care: 6  Current functional level related to goals / functional outcomes:  The patient did not return for further PT after being seen by MD for botox.  See goals for below for status at last session.     PT Long Term Goals - 02/04/15 1023    PT LONG TERM GOAL #1   Title The patient will return demo HEP progression for neck stretching/flexibility.   Baseline Met on 01/25/2015   Time 4   Period Weeks   Status Revised   PT LONG TERM GOAL #2   Title The patient will return demo HEP for postural stabilization.   Baseline Met on 01/25/2015   Time 4   Period Weeks   Status Achieved   PT LONG TERM GOAL #3   Title The patient will be independent with home walking program.   Baseline Met on 01/25/2015   Time 4   Period Weeks   Status Achieved   PT LONG TERM GOAL #4   Title The patient will be able to maintain head looking forward x 2 minutes nonstop.   Baseline Re-established goal with target date of 03/06/2015 (awaiting botox appt)   Time 4   Period Weeks   Status Revised   PT LONG TERM GOAL #5   Title REduce pain R side from 7/10 to 4/10 with right rotation.   Baseline R rotation is 70 deg with pain on R side rated , L rotation is 80 deg   Time 4   Period Weeks   Status New   Additional Long Term Goals   Additional Long Term Goals --        Remaining deficits: See patient goals above, torticollis continues.   Education / Equipment: HEP.  Plan: Patient agrees to discharge.  Patient goals were partially met. Patient is being discharged due to meeting the stated rehab goals.  ?????         Thank you for the referral of this patient. Rudell Cobb, MPT  [facility] 06/03/2015, 12:20 PM  [facility] [address] [place], [place], [address] Phone: [phone]   Fax:  [phone]

## 2015-06-21 ENCOUNTER — Ambulatory Visit: Payer: BC Managed Care – PPO | Admitting: Family Medicine

## 2015-07-05 ENCOUNTER — Other Ambulatory Visit: Payer: Self-pay | Admitting: Sports Medicine

## 2015-08-04 ENCOUNTER — Encounter: Payer: Self-pay | Admitting: Family Medicine

## 2015-08-04 ENCOUNTER — Ambulatory Visit (INDEPENDENT_AMBULATORY_CARE_PROVIDER_SITE_OTHER): Payer: BC Managed Care – PPO | Admitting: Family Medicine

## 2015-08-04 VITALS — BP 151/105 | HR 92 | Wt 257.0 lb

## 2015-08-04 DIAGNOSIS — E119 Type 2 diabetes mellitus without complications: Secondary | ICD-10-CM

## 2015-08-04 DIAGNOSIS — E669 Obesity, unspecified: Secondary | ICD-10-CM

## 2015-08-04 DIAGNOSIS — E1169 Type 2 diabetes mellitus with other specified complication: Secondary | ICD-10-CM

## 2015-08-04 DIAGNOSIS — I10 Essential (primary) hypertension: Secondary | ICD-10-CM

## 2015-08-04 DIAGNOSIS — E8881 Metabolic syndrome: Secondary | ICD-10-CM

## 2015-08-04 DIAGNOSIS — M1000 Idiopathic gout, unspecified site: Secondary | ICD-10-CM

## 2015-08-04 LAB — POCT GLYCOSYLATED HEMOGLOBIN (HGB A1C): HEMOGLOBIN A1C: 7

## 2015-08-04 MED ORDER — ALLOPURINOL 300 MG PO TABS: 300.0000 mg | ORAL_TABLET | Freq: Two times a day (BID) | ORAL | Status: DC

## 2015-08-04 MED ORDER — METFORMIN HCL 1000 MG PO TABS: 1000.0000 mg | ORAL_TABLET | Freq: Two times a day (BID) | ORAL | Status: DC

## 2015-08-04 MED ORDER — NIFEDIPINE ER OSMOTIC RELEASE 30 MG PO TB24: 30.0000 mg | ORAL_TABLET | Freq: Every day | ORAL | Status: DC

## 2015-08-04 MED ORDER — PREDNISONE 5 MG PO TABS: ORAL_TABLET | ORAL | Status: DC

## 2015-08-04 MED ORDER — LEVOTHYROXINE SODIUM 112 MCG PO TABS: 112.0000 ug | ORAL_TABLET | Freq: Every day | ORAL | Status: DC

## 2015-08-04 MED ORDER — LABETALOL HCL 300 MG PO TABS: 300.0000 mg | ORAL_TABLET | Freq: Two times a day (BID) | ORAL | Status: DC

## 2015-08-04 NOTE — Progress Notes (Signed)
   Subjective:    Patient ID: Brenda Sharp, female    DOB: Oct 11, 1980, 34 y.o.   MRN: [id number]  HPI F/U gout - has had a couple of flares since I last saw her.  I given her prescription to do a prednisone taper with a refill in case she needed it. Instead she is actually been taking 10 mg daily for the last 2 months.  Lab Results  Component Value Date   LABURIC 10.6* 05/05/2015   Diabetes - no hypoglycemic events. No wounds or sores that are not healing well. No increased thirst or urination. Checking glucose at home. Taking medications as prescribed without any side effects.  She has been on prednisone for the last couple of months. She also gained a few pounds since then.   Hypertension-- Pt denies chest pain, SOB, dizziness, or heart palpitations.  Taking meds as directed w/o problems.  Denies medication side effects.     Review of Systems     Objective:   Physical Exam  Constitutional: She is oriented to person, place, and time. She appears well-developed and well-nourished.  HENT:  Head: Normocephalic and atraumatic.  Cardiovascular: Normal rate, regular rhythm and normal heart sounds.   Pulmonary/Chest: Effort normal and breath sounds normal.  Neurological: She is alert and oriented to person, place, and time.  Skin: Skin is warm and dry.  Psychiatric: She has a normal mood and affect. Her behavior is normal.          Assessment & Plan:  Gout - will inc allopurinol to 600mg .  we'll recheck uric acid in 4 weeks to see if her level is more therapeutic. I discussed with her that until we get her uric acid level down and she is going to continue to have flares. We also noted need to go ahead and start weaning her prednisone. Actually written for a taper but she's actually been taking it daily instead. So she is now been on 10 mg daily for approximately 2 months. We'll go ahead and decrease her dose to 5 mg for a few weeks and then decrease to a half of a TAB. When he did go  ahead and wean her off this over the next few weeks.  DM- A1C of 7.0.  Will incresae metfirmin. U pfrom last time. Work on decreasing prednisone.  She has gained some weight on this.   Hypertension-uncontrolled today. I'll refill her medications. We'll have her return in about 2 weeks to make sure that her blood pressures under better control as we wean her off the prednisone.

## 2015-08-08 DIAGNOSIS — M1A9XX1 Chronic gout, unspecified, with tophus (tophi): Secondary | ICD-10-CM

## 2015-08-08 HISTORY — DX: Chronic gout, unspecified, with tophus (tophi): M1A.9XX1

## 2015-08-08 HISTORY — PX: OTHER SURGICAL HISTORY: SHX169

## 2015-10-29 ENCOUNTER — Other Ambulatory Visit: Payer: Self-pay | Admitting: Family Medicine

## 2015-11-08 ENCOUNTER — Other Ambulatory Visit: Payer: Self-pay | Admitting: Family Medicine

## 2015-12-29 ENCOUNTER — Encounter: Payer: Self-pay | Admitting: *Deleted

## 2015-12-29 ENCOUNTER — Telehealth: Payer: Self-pay | Admitting: *Deleted

## 2015-12-29 NOTE — Telephone Encounter (Signed)
Pt called and requested a letter for postponement for jury duty. Pt will come by to p/u.Brenda Sharp [place]

## 2015-12-31 DIAGNOSIS — G894 Chronic pain syndrome: Secondary | ICD-10-CM | POA: Insufficient documentation

## 2016-01-13 ENCOUNTER — Ambulatory Visit (INDEPENDENT_AMBULATORY_CARE_PROVIDER_SITE_OTHER): Payer: BC Managed Care – PPO | Admitting: Family Medicine

## 2016-01-13 ENCOUNTER — Encounter: Payer: Self-pay | Admitting: Family Medicine

## 2016-01-13 VITALS — BP 130/88 | HR 92 | Wt 255.0 lb

## 2016-01-13 VITALS — BP 132/97 | HR 92 | Wt 255.0 lb

## 2016-01-13 DIAGNOSIS — E119 Type 2 diabetes mellitus without complications: Secondary | ICD-10-CM | POA: Diagnosis not present

## 2016-01-13 DIAGNOSIS — E1169 Type 2 diabetes mellitus with other specified complication: Secondary | ICD-10-CM

## 2016-01-13 DIAGNOSIS — I10 Essential (primary) hypertension: Secondary | ICD-10-CM

## 2016-01-13 DIAGNOSIS — E669 Obesity, unspecified: Secondary | ICD-10-CM

## 2016-01-13 DIAGNOSIS — M1A09X1 Idiopathic chronic gout, multiple sites, with tophus (tophi): Secondary | ICD-10-CM | POA: Diagnosis not present

## 2016-01-13 DIAGNOSIS — M1A0621 Idiopathic chronic gout, left knee, with tophus (tophi): Secondary | ICD-10-CM | POA: Diagnosis not present

## 2016-01-13 DIAGNOSIS — M1A9XX1 Chronic gout, unspecified, with tophus (tophi): Secondary | ICD-10-CM | POA: Diagnosis not present

## 2016-01-13 LAB — POCT UA - MICROALBUMIN
Creatinine, POC: 200 mg/dL
Microalbumin Ur, POC: 80 mg/L

## 2016-01-13 MED ORDER — SITAGLIPTIN PHOS-METFORMIN HCL 50-1000 MG PO TABS: 1.0000 | ORAL_TABLET | Freq: Two times a day (BID) | ORAL | Status: DC

## 2016-01-13 MED ORDER — LESINURAD 200 MG PO TABS: 200.0000 mg | ORAL_TABLET | Freq: Every day | ORAL | Status: DC

## 2016-01-13 MED ORDER — AMLODIPINE BESYLATE 2.5 MG PO TABS: 2.5000 mg | ORAL_TABLET | Freq: Every day | ORAL | Status: DC

## 2016-01-13 NOTE — Patient Instructions (Signed)
Thank you for coming in today. 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  Kidney allopurinol. Add Zurampic.  Recheck in a few weeks.   Gout Gout is an inflammatory arthritis caused by a buildup of uric acid crystals in the joints. Uric acid is a chemical that is normally present in the blood. When the level of uric acid in the blood is too high it can form crystals that deposit in your joints and tissues. This causes joint redness, soreness, and swelling (inflammation). Repeat attacks are common. Over time, uric acid crystals can form into masses (tophi) near a joint, destroying bone and causing disfigurement. Gout is treatable and often preventable. CAUSES  The disease begins with elevated levels of uric acid in the blood. Uric acid is produced by your body when it breaks down a naturally found substance called purines. Certain foods you eat, such as meats and fish, contain high amounts of purines. Causes of an elevated uric acid level include:  Being passed down from parent to child (heredity).  Diseases that cause increased uric acid production (such as obesity, psoriasis, and certain cancers).  Excessive alcohol use.  Diet, especially diets rich in meat and seafood.  Medicines, including certain cancer-fighting medicines (chemotherapy), water pills (diuretics), and aspirin.  Chronic kidney disease. The kidneys are no longer able to remove uric acid well.  Problems with metabolism. Conditions strongly associated with gout include:  Obesity.  High blood pressure.  High cholesterol.  Diabetes. Not everyone with elevated uric acid levels gets gout. It is not understood why some people get gout and others do not. Surgery, joint injury, and eating too much of certain foods are some of the factors that can lead to gout attacks. SYMPTOMS   An attack of gout comes on quickly. It causes intense pain with redness, swelling, and warmth in a joint.  Fever can occur.  Often, only one joint is  involved. Certain joints are more commonly involved:  Base of the big toe.  Knee.  Ankle.  Wrist.  Finger. Without treatment, an attack usually goes away in a few days to weeks. Between attacks, you usually will not have symptoms, which is different from many other forms of arthritis. DIAGNOSIS  Your caregiver will suspect gout based on your symptoms and exam. In some cases, tests may be recommended. The tests may include:  Blood tests.  Urine tests.  X-rays.  Joint fluid exam. This exam requires a needle to remove fluid from the joint (arthrocentesis). Using a microscope, gout is confirmed when uric acid crystals are seen in the joint fluid. TREATMENT  There are two phases to gout treatment: treating the sudden onset (acute) attack and preventing attacks (prophylaxis).  Treatment of an Acute Attack.  Medicines are used. These include anti-inflammatory medicines or steroid medicines.  An injection of steroid medicine into the affected joint is sometimes necessary.  The painful joint is rested. Movement can worsen the arthritis.  You may use warm or cold treatments on painful joints, depending which works best for you.  Treatment to Prevent Attacks.  If you suffer from frequent gout attacks, your caregiver may advise preventive medicine. These medicines are started after the acute attack subsides. These medicines either help your kidneys eliminate uric acid from your body or decrease your uric acid production. You may need to stay on these medicines for a very long time.  The early phase of treatment with preventive medicine can be associated with an increase in acute gout attacks. For  this reason, during the first few months of treatment, your caregiver may also advise you to take medicines usually used for acute gout treatment. Be sure you understand your caregiver's directions. Your caregiver may make several adjustments to your medicine dose before these medicines are  effective.  Discuss dietary treatment with your caregiver or dietitian. Alcohol and drinks high in sugar and fructose and foods such as meat, poultry, and seafood can increase uric acid levels. Your caregiver or dietitian can advise you on drinks and foods that should be limited. HOME CARE INSTRUCTIONS   Do not take aspirin to relieve pain. This raises uric acid levels.  Only take over-the-counter or prescription medicines for pain, discomfort, or fever as directed by your caregiver.  Rest the joint as much as possible. When in bed, keep sheets and blankets off painful areas.  Keep the affected joint raised (elevated).  Apply warm or cold treatments to painful joints. Use of warm or cold treatments depends on which works best for you.  Use crutches if the painful joint is in your leg.  Drink enough fluids to keep your urine clear or pale yellow. This helps your body get rid of uric acid. Limit alcohol, sugary drinks, and fructose drinks.  Follow your dietary instructions. Pay careful attention to the amount of protein you eat. Your daily diet should emphasize fruits, vegetables, whole grains, and fat-free or low-fat milk products. Discuss the use of coffee, vitamin C, and cherries with your caregiver or dietitian. These may be helpful in lowering uric acid levels.  Maintain a healthy body weight. SEEK MEDICAL CARE IF:   You develop diarrhea, vomiting, or any side effects from medicines.  You do not feel better in 24 hours, or you are getting worse. SEEK IMMEDIATE MEDICAL CARE IF:   Your joint becomes suddenly more tender, and you have chills or a fever. MAKE SURE YOU:   Understand these instructions.  Will watch your condition.  Will get help right away if you are not doing well or get worse.   This information is not intended to replace advice given to you by your health care provider. Make sure you discuss any questions you have with your health care provider.   Document  Released: 07/21/2000 Document Revised: 08/14/2014 Document Reviewed: 03/06/2012 Elsevier Interactive Patient Education [employer].

## 2016-01-13 NOTE — Progress Notes (Signed)
Subjective:    CC: HTN  HPI:  Hypertension- Pt denies chest pain, SOB, dizziness, or heart palpitations.  Taking meds as directed w/o problems.  Denies medication side effects.    Diabetes - no hypoglycemic events. No wounds or sores that are not healing well. No increased thirst or urination. Checking glucose at home. Taking medications as prescribed without any side effects.  Gout - She was supposed actually return in January to have a recheck on her uric acid after we adjusted her allopurinol. She did not follow back up. She says she forgot about it.  Noticed a lesion on her left knee x 1 months.    Past medical history, Surgical history, Family history not pertinant except as noted below, Social history, Allergies, and medications have been entered into the medical record, reviewed, and corrections made.   Review of Systems: No fevers, chills, night sweats, weight loss, chest pain, or shortness of breath.   Objective:    General: Well Developed, well nourished, and in no acute distress.  Neuro: Alert and oriented x3, extra-ocular muscles intact, sensation grossly intact.  HEENT: Normocephalic, atraumatic  Skin: Warm and dry, no rashes. Cardiac: Regular rate and rhythm, no murmurs rubs or gallops, no lower extremity edema.  Respiratory: Clear to auscultation bilaterally. Not using accessory muscles, speaking in full sentences.   Impression and Recommendations:   HTN - Diastolic not well controlled. Will start amlodipine. She took it years ago and does not remember any side effects or problems with it. Follow-up in 3-4 weeks.  Diabetes -  Not well controlled. Hemoglobin A1c of 7.1 today. Will add [place].  Continue to work on diet and exercise and weight loss. Follow-up in 3 months.  Gout - Due to recheck uric acid-if not at goal then consider combination therapy.   Nodule left knee-consistent with possible tophi. Will have her see sports medicine for further evaluation and  confirm diagnosis. Explained these can happen when gout is not well controlled.

## 2016-01-13 NOTE — Progress Notes (Signed)
   Subjective:    I'm seeing this patient as a consultation for:  Dr Madilyn Fireman  CC: Left knee mass  HPI: Patient is a mass in the anterior portion of her left knee has been present for a few months to years. She denies any injury and notes that it only recently became moderately to mildly painful. She notes a history of chronic gout that has been difficult to manage with allopurinol alone. She feels well otherwise with no fevers or chills.  Past medical history, Surgical history, Family history not pertinant except as noted below, Social history, Allergies, and medications have been entered into the medical record, reviewed, and no changes needed.   Review of Systems: No new headache, visual changes, nausea, vomiting, diarrhea, constipation, dizziness, abdominal pain, skin rash, fevers, chills, night sweats, weight loss, swollen lymph nodes, body aches, joint swelling, muscle aches, chest pain, shortness of breath, mood changes, visual or auditory hallucinations.   Objective:    Filed Vitals:   01/13/16 1120  BP: 132/97  Pulse: 92   General: Well Developed, well nourished, and in no acute distress.  Neuro/Psych: Alert and oriented x3, extra-ocular muscles intact, able to move all 4 extremities, sensation grossly intact. Skin: Warm and dry, no rashes noted.  Respiratory: Not using accessory muscles, speaking in full sentences, trachea midline.  Cardiovascular: Pulses palpable, no extremity edema. Abdomen: Does not appear distended. MSK: Left knee with firm nontender mass. Approximately 2 x 2 cm. No skin erythema.  Limited musculoskeletal ultrasound left anterior knee. Masses noncystic in appearance. No blood flow within the mass. It is superficial located within the suprapatellar  bursa area. Normal bony appearance of surrounding area Normal Patellar tendon  Results for orders placed or performed in visit on 01/13/16 (from the past 24 hour(s))  POCT UA - Microalbumin     Status: None     Collection Time: 01/13/16 11:03 AM  Result Value Ref Range   Microalbumin Ur, POC 80 mg/L   Creatinine, POC 200 mg/dL   Albumin/Creatinine Ratio, Urine, POC <30    No results found.  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 Discussed options. Plan against incision and drainage as this will likely result in infection or gout flare. Plan to check uric acid levels. Continue allopurinol. I will Add Zurampic. Recheck in a few weeks.  This case required medical decision making of moderate complexity.

## 2016-01-13 NOTE — Addendum Note (Signed)
Addended by: Teddy Spike on: 01/13/2016 11:03 AM   Modules accepted: Orders

## 2016-02-05 ENCOUNTER — Other Ambulatory Visit: Payer: Self-pay | Admitting: Family Medicine

## 2016-02-13 ENCOUNTER — Other Ambulatory Visit: Payer: Self-pay | Admitting: Family Medicine

## 2016-03-31 ENCOUNTER — Other Ambulatory Visit: Payer: Self-pay | Admitting: Family Medicine

## 2016-04-05 ENCOUNTER — Telehealth: Payer: Self-pay | Admitting: Family Medicine

## 2016-04-05 NOTE — Telephone Encounter (Signed)
I called pt and left a message for her to schedule f/u appt with Dr. Madilyn Fireman on her meds

## 2016-04-11 ENCOUNTER — Other Ambulatory Visit: Payer: Self-pay | Admitting: Family Medicine

## 2016-04-24 ENCOUNTER — Ambulatory Visit: Payer: BC Managed Care – PPO | Admitting: Family Medicine

## 2016-04-25 ENCOUNTER — Ambulatory Visit (INDEPENDENT_AMBULATORY_CARE_PROVIDER_SITE_OTHER): Payer: BLUE CROSS/BLUE SHIELD | Admitting: Family Medicine

## 2016-04-25 ENCOUNTER — Encounter: Payer: Self-pay | Admitting: Family Medicine

## 2016-04-25 VITALS — BP 132/82 | HR 96 | Ht 63.0 in | Wt 261.0 lb

## 2016-04-25 DIAGNOSIS — E1165 Type 2 diabetes mellitus with hyperglycemia: Secondary | ICD-10-CM

## 2016-04-25 DIAGNOSIS — Z23 Encounter for immunization: Secondary | ICD-10-CM | POA: Diagnosis not present

## 2016-04-25 DIAGNOSIS — IMO0001 Reserved for inherently not codable concepts without codable children: Secondary | ICD-10-CM

## 2016-04-25 DIAGNOSIS — R110 Nausea: Secondary | ICD-10-CM

## 2016-04-25 DIAGNOSIS — R1013 Epigastric pain: Secondary | ICD-10-CM

## 2016-04-25 DIAGNOSIS — R635 Abnormal weight gain: Secondary | ICD-10-CM

## 2016-04-25 DIAGNOSIS — F329 Major depressive disorder, single episode, unspecified: Secondary | ICD-10-CM

## 2016-04-25 DIAGNOSIS — I10 Essential (primary) hypertension: Secondary | ICD-10-CM | POA: Diagnosis not present

## 2016-04-25 DIAGNOSIS — F32A Depression, unspecified: Secondary | ICD-10-CM

## 2016-04-25 LAB — POCT GLYCOSYLATED HEMOGLOBIN (HGB A1C): HEMOGLOBIN A1C: 7.1

## 2016-04-25 NOTE — Patient Instructions (Addendum)
Ok to hold Janumet for 2 weeks and see if feel better. Make sure not taking metformin and Janumet at the same time.  They both have metformin in them.

## 2016-04-25 NOTE — Progress Notes (Signed)
Subjective:    CC: Upper abdominal pain.   HPI:  F/U depression - Off of her 20 mg of Cymbalta for 5 days.  Having problems with her insurance covering.   Hypertension- Pt denies chest pain, SOB, dizziness, or heart palpitations.  Taking meds as directed w/o problems.  Denies medication side effects.    Diabetes - no hypoglycemic events. No wounds or sores that are not healing well. No increased thirst or urination. Checking glucose at home. Taking medications as prescribed without any side effects.  We added januvia at last OV.  She has gained 6 lbs.    Upper abd pain x 1 mo.  + nausea, + diarrhea. No fever or chills.  Feeling really bloated.  No blood in the stool. She says it typically starts to get worse while she is eating and then, eases off about an hour after she eats. The pain is mostly at the midline and radiates to the right and the left. That seems a little bit worse on the left side at times. No actual vomiting.   Past medical history, Surgical history, Family history not pertinant except as noted below, Social history, Allergies, and medications have been entered into the medical record, reviewed, and corrections made.   Review of Systems: No fevers, chills, night sweats, weight loss, chest pain, or shortness of breath.   Objective:    General: Well Developed, well nourished, and in no acute distress.  Neuro: Alert and oriented x3, extra-ocular muscles intact, sensation grossly intact.  HEENT: Normocephalic, atraumatic  Skin: Warm and dry, no rashes. Cardiac: Regular rate and rhythm, no murmurs rubs or gallops, no lower extremity edema.  Respiratory: Clear to auscultation bilaterally. Not using accessory muscles, speaking in full sentences. Abd: Soft with normal bowel sounds. Tender in the epigastric area. No rebound or guarding.   Impression and Recommendations:   DM- STable. Not improved from previous.  A1C of 7.1.  Hold Janumet for 2 weeks to see if this was causing  her GI upset. She also still had METFORMIN so on her medication list though theoretically she should've run out of that prescription 6 months ago but just want her to check her home and make sure that she's not taking both medications at the same time.  HTN - Well controlled. Continue current regimen. Follow up in  3-4 months.    Depression - stable.  F/U in 3-4 months.    Epigastric pain with nausea-we'll have her hold her Januvia for the next 2 weeks just to see if that improves her symptoms. Is the newest medication that we just started in June. And be a little less suspicious of the Cymbalta.Check CBC with diff, pancreas and liver enzymes. Will schedule for gallbladder ultrasound and do additional blood work.  Abnormal weight gain - she is in 6 more pounds. She says in fact she's actually tried really hard to cut out carbs including breads potatoes rice etc. She doesn't understand why she continues to gain. She is actually been having intermittent diarrhea since she does not feel constipated. Check TSH.

## 2016-04-26 ENCOUNTER — Telehealth: Payer: Self-pay | Admitting: Family Medicine

## 2016-04-26 DIAGNOSIS — M109 Gout, unspecified: Secondary | ICD-10-CM

## 2016-04-26 MED ORDER — PREDNISONE 20 MG PO TABS: ORAL_TABLET | ORAL | 0 refills | Status: DC

## 2016-04-26 NOTE — Telephone Encounter (Signed)
Rx sent, Pt advised.  

## 2016-04-26 NOTE — Telephone Encounter (Signed)
Pt called clinic today requesting Prednisone for a GOUT flare up. Pt states she has taken colchiceine so many times in the past that is now upsets her stomach. Will route to a Provider in office today for review. Pharmacy on file is correct.

## 2016-04-26 NOTE — Telephone Encounter (Signed)
Ok to send 20mg  of prednisone 3 tablets for 3 days, 2 tablets for 3 days, 1 tablet for 3 days, 1/2 tablet for 4 days. #20 no refills for gout flare.

## 2016-04-27 ENCOUNTER — Other Ambulatory Visit: Payer: BLUE CROSS/BLUE SHIELD

## 2016-04-28 ENCOUNTER — Ambulatory Visit (INDEPENDENT_AMBULATORY_CARE_PROVIDER_SITE_OTHER): Payer: BLUE CROSS/BLUE SHIELD

## 2016-04-28 DIAGNOSIS — K760 Fatty (change of) liver, not elsewhere classified: Secondary | ICD-10-CM | POA: Diagnosis not present

## 2016-04-29 LAB — COMPLETE METABOLIC PANEL WITH GFR
ALBUMIN: 4.6 g/dL (ref 3.6–5.1)
ALK PHOS: 73 U/L (ref 33–115)
ALT: 41 U/L — AB (ref 6–29)
AST: 28 U/L (ref 10–30)
BUN: 14 mg/dL (ref 7–25)
CALCIUM: 9.7 mg/dL (ref 8.6–10.2)
CO2: 27 mmol/L (ref 20–31)
CREATININE: 0.9 mg/dL (ref 0.50–1.10)
Chloride: 103 mmol/L (ref 98–110)
GFR, Est African American: >89 mL/min (ref >=60)
GFR, Est Non African American: 85 mL/min (ref >=60)
GLUCOSE: 145 mg/dL — AB (ref 65–99)
POTASSIUM: 4.5 mmol/L (ref 3.5–5.3)
SODIUM: 141 mmol/L (ref 135–146)
TOTAL PROTEIN: 6.9 g/dL (ref 6.1–8.1)
Total Bilirubin: 0.5 mg/dL (ref 0.2–1.2)

## 2016-04-29 LAB — CBC WITH DIFFERENTIAL/PLATELET
Basophils Absolute: 0 {cells}/uL (ref 0–200)
Basophils Relative: 0 %
Eosinophils Absolute: 0 {cells}/uL — ABNORMAL LOW (ref 15–500)
Eosinophils Relative: 0 %
HCT: 44.6 % (ref 35.0–45.0)
Hemoglobin: 14.8 g/dL (ref 11.7–15.5)
Lymphocytes Relative: 24 %
Lymphs Abs: 2784 {cells}/uL (ref 850–3900)
MCH: 31.1 pg (ref 27.0–33.0)
MCHC: 33.2 g/dL (ref 32.0–36.0)
MCV: 93.7 fL (ref 80.0–100.0)
MPV: 9 fL (ref 7.5–12.5)
Monocytes Absolute: 464 {cells}/uL (ref 200–950)
Monocytes Relative: 4 %
Neutro Abs: 8352 {cells}/uL — ABNORMAL HIGH (ref 1500–7800)
Neutrophils Relative %: 72 %
Platelets: 352 K/uL (ref 140–400)
RBC: 4.76 MIL/uL (ref 3.80–5.10)
RDW: 14.1 % (ref 11.0–15.0)
WBC: 11.6 K/uL — ABNORMAL HIGH (ref 3.8–10.8)

## 2016-04-29 LAB — TSH: TSH: 1.2 m[IU]/L

## 2016-04-29 LAB — LIPASE: Lipase: 12 U/L (ref 7–60)

## 2016-04-29 LAB — AMYLASE: Amylase: 19 U/L (ref 0–105)

## 2016-05-12 ENCOUNTER — Other Ambulatory Visit: Payer: Self-pay | Admitting: Family Medicine

## 2016-05-12 ENCOUNTER — Other Ambulatory Visit: Payer: Self-pay | Admitting: Physician Assistant

## 2016-05-12 DIAGNOSIS — M109 Gout, unspecified: Secondary | ICD-10-CM

## 2016-05-23 ENCOUNTER — Telehealth: Payer: Self-pay | Admitting: Family Medicine

## 2016-05-23 DIAGNOSIS — M1A9XX1 Chronic gout, unspecified, with tophus (tophi): Secondary | ICD-10-CM

## 2016-05-23 DIAGNOSIS — M109 Gout, unspecified: Secondary | ICD-10-CM

## 2016-05-23 NOTE — Telephone Encounter (Signed)
Pt called clinic today requesting Prednisone for a GOUT flare up. She was treated one month ago with same symptoms. Will route to PCP for review. Pharmacy on file is correct.   While on the phone with Pt, went over some dietary tips to prevent GOUT flares. Questioned if Pt was taking the allopurinol as prescribed, states she is. Informed Pt that she may need to schedule a earlier follow up for GOUT with PCP to discuss different Rx options. Pt states she has taken colchiceine so many times in the past that is now upsets her stomach.

## 2016-05-24 MED ORDER — PREDNISONE 20 MG PO TABS: ORAL_TABLET | ORAL | 0 refills | Status: DC

## 2016-05-24 NOTE — Telephone Encounter (Signed)
Called pt and lvm informing her that I got her vm about her taking allopurinol and aleve for her gout flare and this has not helped. I informed her that she will need to have labs done and that I have faxed them downstairs for her to come by to get them done. I will send rx for prednisone taper for her .Brenda [facility] [place]

## 2016-05-25 LAB — URIC ACID: Uric Acid, Serum: 5.6 mg/dL (ref 2.5–7.0)

## 2016-06-09 ENCOUNTER — Other Ambulatory Visit: Payer: Self-pay | Admitting: *Deleted

## 2016-06-09 MED ORDER — DULOXETINE HCL 60 MG PO CPEP
60.0000 mg | ORAL_CAPSULE | Freq: Two times a day (BID) | ORAL | 1 refills | Status: DC
Start: 2016-06-09 — End: 2016-11-06

## 2016-07-07 HISTORY — PX: OTHER SURGICAL HISTORY: SHX169

## 2016-07-25 ENCOUNTER — Ambulatory Visit (INDEPENDENT_AMBULATORY_CARE_PROVIDER_SITE_OTHER): Payer: BLUE CROSS/BLUE SHIELD | Admitting: Family Medicine

## 2016-07-25 ENCOUNTER — Encounter: Payer: Self-pay | Admitting: Family Medicine

## 2016-07-25 VITALS — BP 127/87 | HR 94 | Ht 63.0 in | Wt 259.0 lb

## 2016-07-25 DIAGNOSIS — M1A0621 Idiopathic chronic gout, left knee, with tophus (tophi): Secondary | ICD-10-CM | POA: Diagnosis not present

## 2016-07-25 DIAGNOSIS — Z23 Encounter for immunization: Secondary | ICD-10-CM | POA: Diagnosis not present

## 2016-07-25 DIAGNOSIS — I10 Essential (primary) hypertension: Secondary | ICD-10-CM | POA: Diagnosis not present

## 2016-07-25 DIAGNOSIS — IMO0001 Reserved for inherently not codable concepts without codable children: Secondary | ICD-10-CM

## 2016-07-25 DIAGNOSIS — E1165 Type 2 diabetes mellitus with hyperglycemia: Secondary | ICD-10-CM | POA: Diagnosis not present

## 2016-07-25 LAB — POCT GLYCOSYLATED HEMOGLOBIN (HGB A1C): Hemoglobin A1C: 8.3

## 2016-07-25 MED ORDER — NIFEDIPINE ER OSMOTIC RELEASE 30 MG PO TB24: ORAL_TABLET | ORAL | 3 refills | Status: DC

## 2016-07-25 MED ORDER — METFORMIN HCL ER 500 MG PO TB24: 1000.0000 mg | ORAL_TABLET | Freq: Every day | ORAL | 3 refills | Status: DC

## 2016-07-25 MED ORDER — SAXAGLIPTIN HCL 5 MG PO TABS: 5.0000 mg | ORAL_TABLET | Freq: Every day | ORAL | 2 refills | Status: DC

## 2016-07-25 NOTE — Progress Notes (Signed)
Subjective:    CC: DM, HTN  HPI:  Diabetes - no hypoglycemic events. No wounds or sores that are not healing well. No increased thirst or urination. Checking glucose at home. Taking medications as prescribed without any side effects.He stopped taking the Janumet because she felt like it was causing stomach upset and irritation. She said when she was on metformin previously by itself she did not have any GI complaints.  Hypertension- Pt denies chest pain, SOB, dizziness, or heart palpitations.  Taking meds as directed w/o problems.  Denies medication side effects.    Gout-overall she's been doing well. Her last year acid was at goal at 5.6. She did have a tophi removed off of her right foot and is in a postop shoe today. Check she has a follow-up with a podiatrist later this week. She denies any recent flares.  Past medical history, Surgical history, Family history not pertinant except as noted below, Social history, Allergies, and medications have been entered into the medical record, reviewed, and corrections made.   Review of Systems: No fevers, chills, night sweats, weight loss, chest pain, or shortness of breath.   Objective:    General: Well Developed, well nourished, and in no acute distress.  Neuro: Alert and oriented x3, extra-ocular muscles intact, sensation grossly intact.  HEENT: Normocephalic, atraumatic  Skin: Warm and dry, no rashes. Cardiac: Regular rate and rhythm, no murmurs rubs or gallops, no lower extremity edema.  Respiratory: Clear to auscultation bilaterally. Not using accessory muscles, speaking in full sentences. Ext: right foot in post op shoe.     Impression and Recommendations:    DM - Uncontrolled. Hemoglobin A1c of 8.3 but she is completely off of all medication. Will add Januvia to her intolerance list. Restart metformin and try Onglyza.  HTN - Well controlled. Continue current regimen. Follow up in  3-4 months.   Gout - Overall she is doing well on her  uric acid is much improved. If she continues to have some intermittent flares and we could consider adding probenecid as well for better control of symptoms. We'll go ahead and recheck uric acid just to make sure still a therapeutic goal since it has been 6 months.

## 2016-08-05 IMAGING — MR MR CERVICAL SPINE W/O CM
4 of 5 series · 22 of 48 positions shown · non-contrast
Comparison: MRI brain 09/28/2014.

CLINICAL DATA: LEFT-sided neck pain for 3 months. History of
torticollis. History of migraine headaches.

EXAM:
MRI CERVICAL SPINE WITHOUT CONTRAST
TECHNIQUE: Multiplanar, multisequence MR imaging of the cervical spine was
performed. No intravenous contrast was administered.

[Series 2: T2 · sagittal · 3.0mm · 0.41mm/px · 7 of 12 slices shown (1 of 3)]
[im 1/12]
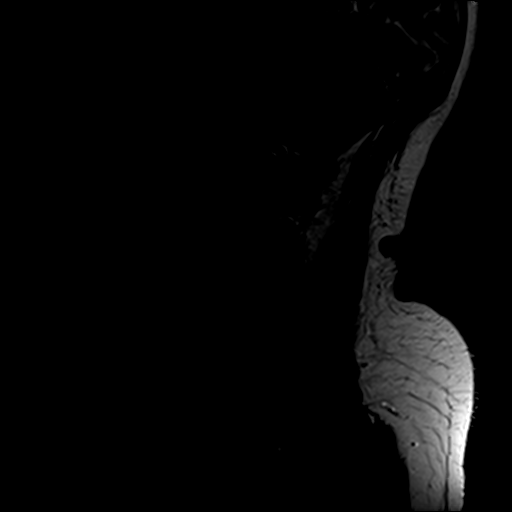
[im 2/12]
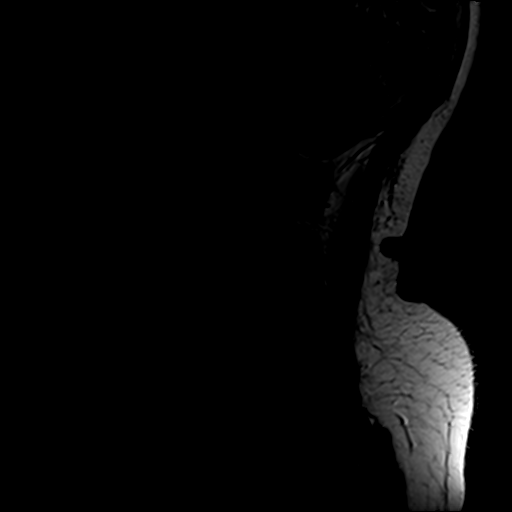
[im 4/12]
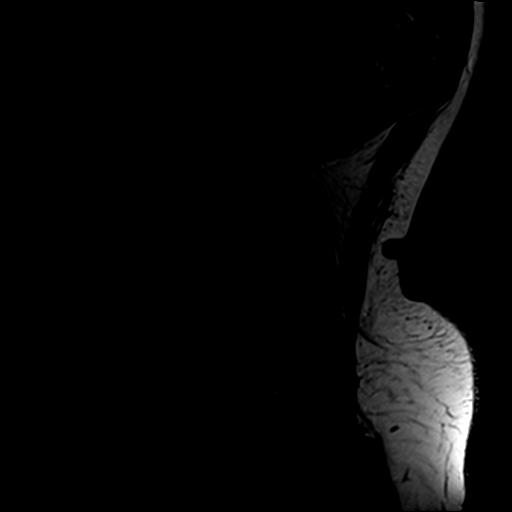
[im 6/12]
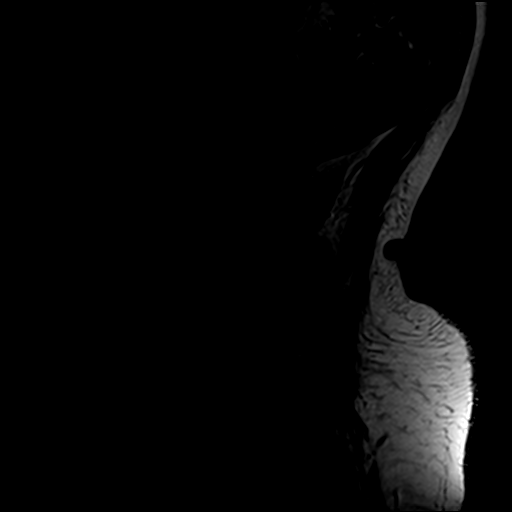
[im 8/12]
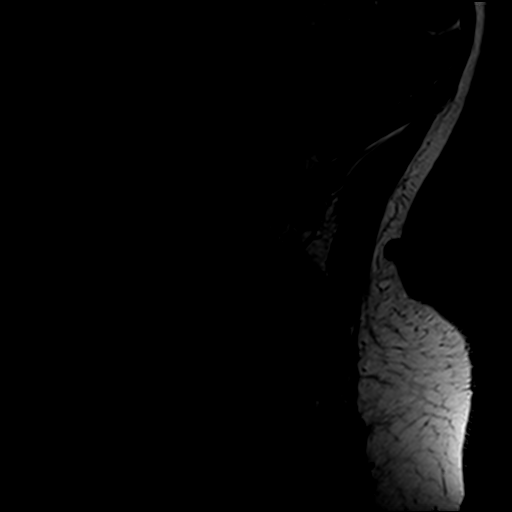
[im 10/12]
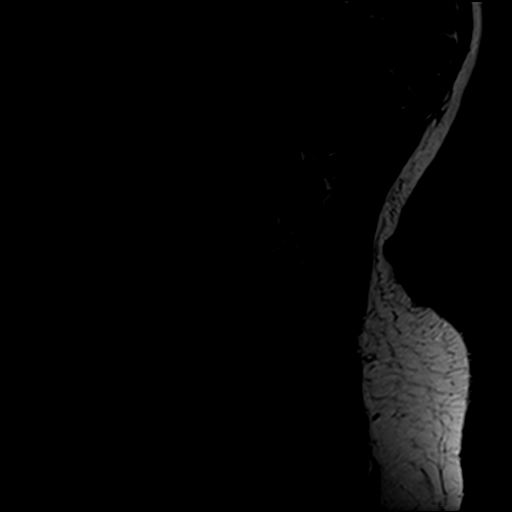
[im 12/12]
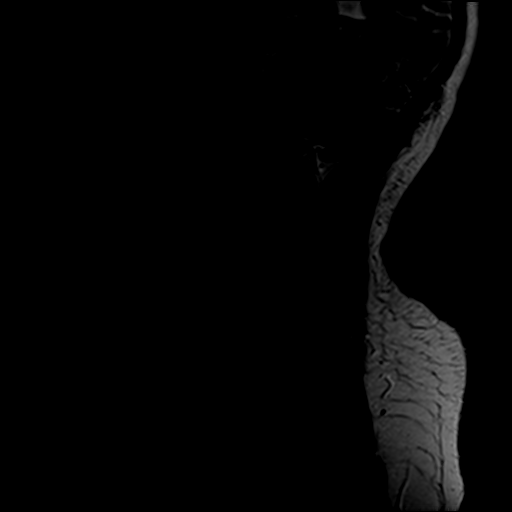

[Series 3: T1 · sagittal · 3.0mm · 0.41mm/px · 3 of 12 slices shown]
[im 3/12]
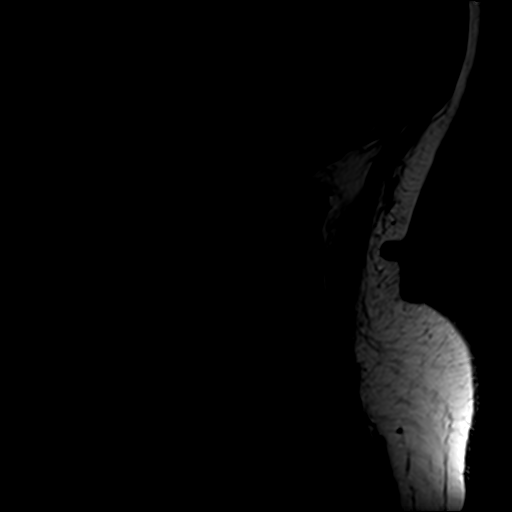
[im 7/12]
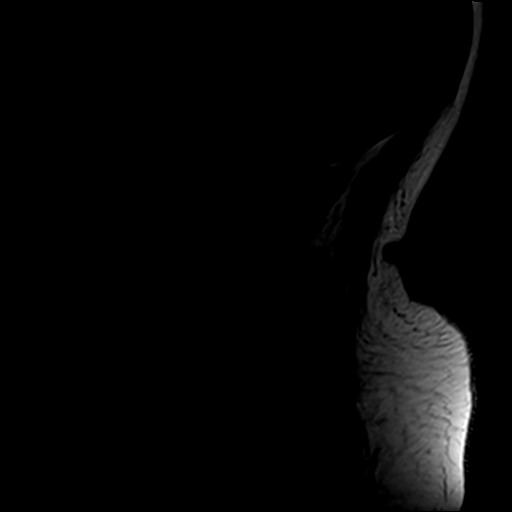
[im 12/12]
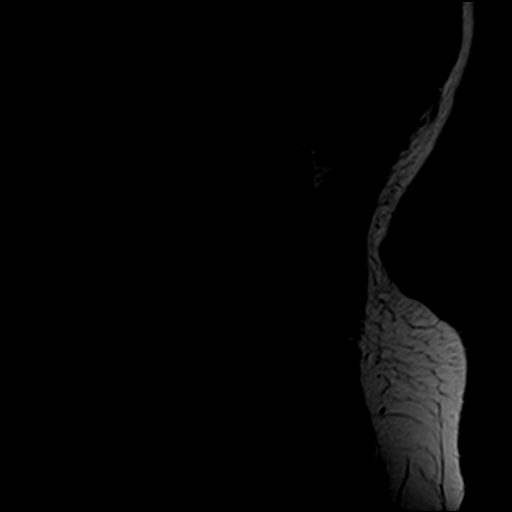

[Series 5: T2 · axial · 3.0mm · 0.39mm/px · z∈[-44,+55]mm · 9 of 27 slices shown (2 of 3)]
[im 1/27]
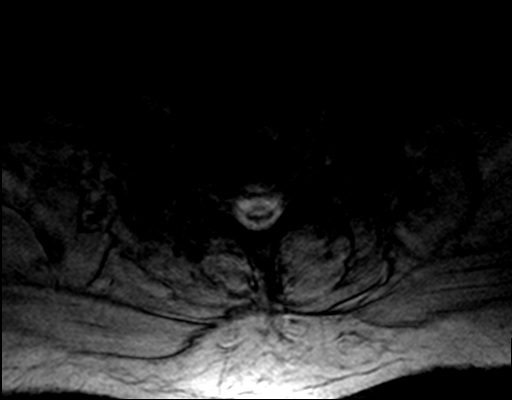
[im 4/27]
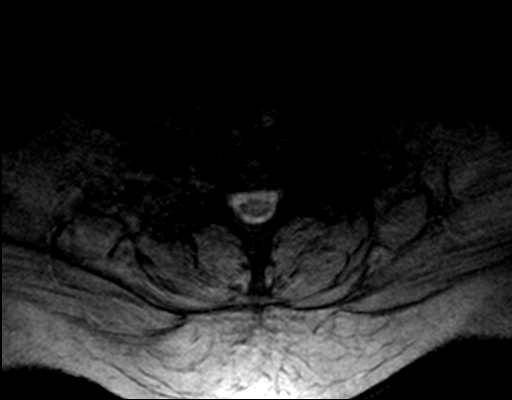
[im 8/27]
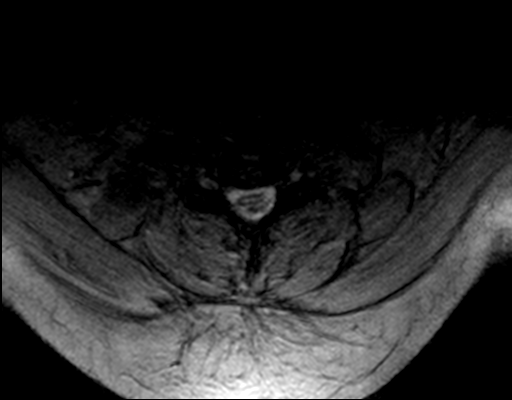
[im 12/27]
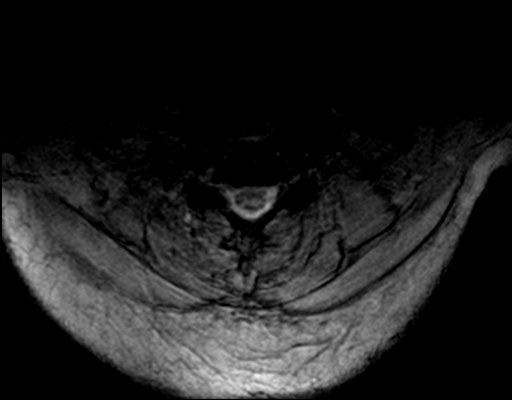
[im 14/27]
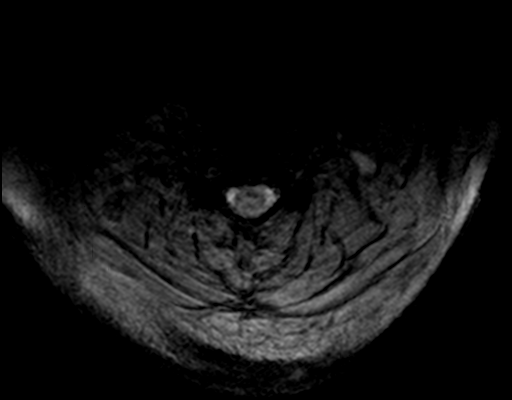
[im 15/27]
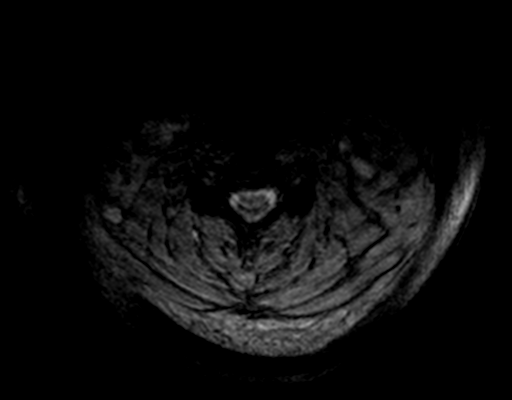
[im 19/27]
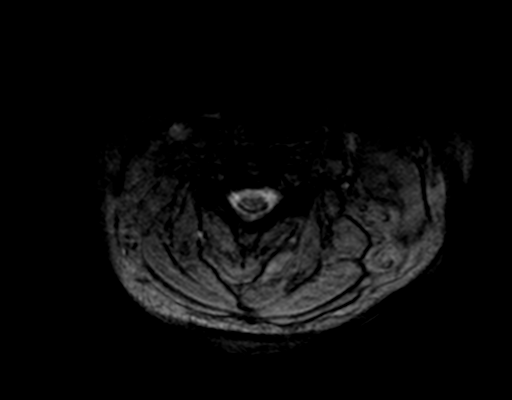
[im 23/27]
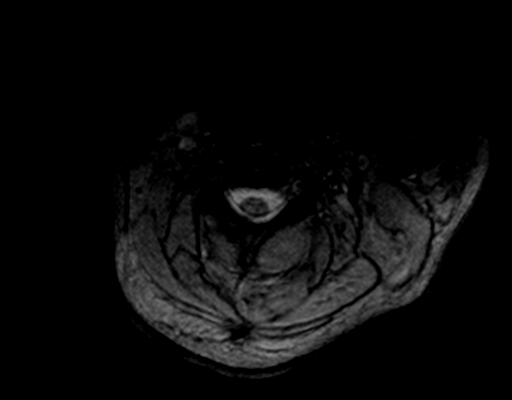
[im 27/27]
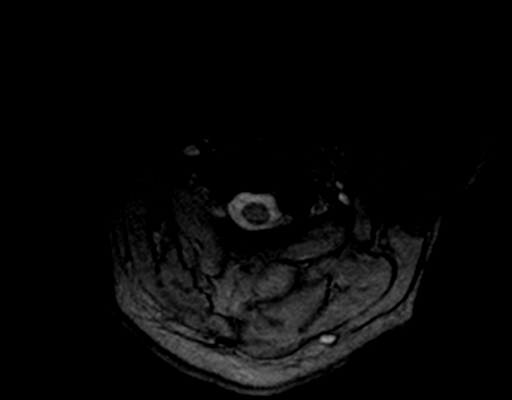

[Series 7: T2 · axial · 3.0mm · 0.39mm/px · z∈[-31,+37]mm · 3 of 26 slices shown (3 of 3)]
[im 4/26]
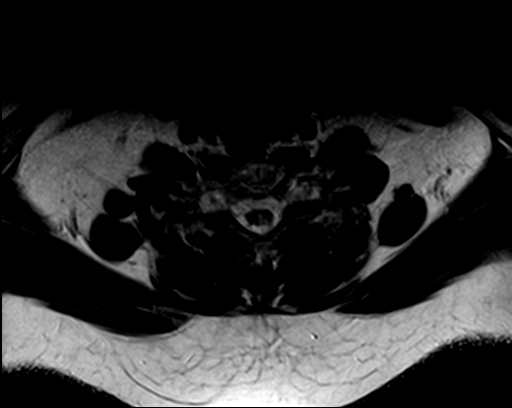
[im 14/26]
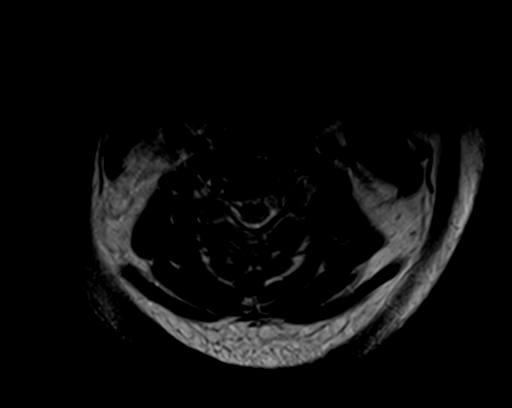
[im 22/26]
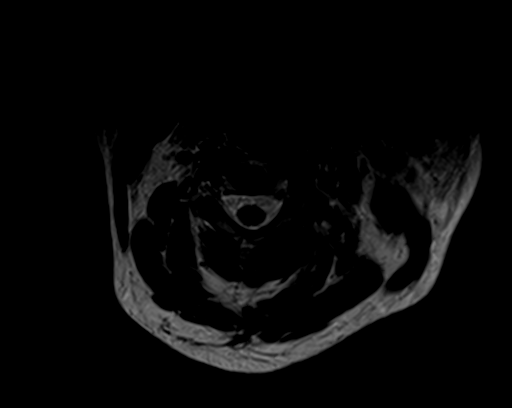

[22 of 48 positions shown; findings below may reference images not displayed]

FINDINGS: The patient was unable to remain motionless for the exam. Small or
subtle lesions could be overlooked.

Alignment: Normal.

Vertebrae: Normal.

Cord: Normal.

Posterior Fossa:  Normal.  No tonsillar herniation.

Vertebral Arteries: BILATERAL patent.

Paraspinal tissues: Normal.

Disc levels:

No disc protrusion or spinal stenosis.
IMPRESSION: Unremarkable cervical spine MRI.

## 2016-08-07 HISTORY — PX: OTHER SURGICAL HISTORY: SHX169

## 2016-09-15 ENCOUNTER — Other Ambulatory Visit: Payer: Self-pay | Admitting: Family Medicine

## 2016-09-18 ENCOUNTER — Encounter: Payer: Self-pay | Admitting: Family Medicine

## 2016-09-18 DIAGNOSIS — Z9189 Other specified personal risk factors, not elsewhere classified: Secondary | ICD-10-CM

## 2016-09-18 DIAGNOSIS — E1165 Type 2 diabetes mellitus with hyperglycemia: Secondary | ICD-10-CM

## 2016-09-18 DIAGNOSIS — IMO0001 Reserved for inherently not codable concepts without codable children: Secondary | ICD-10-CM

## 2016-09-18 NOTE — Telephone Encounter (Signed)
Referral entered, Pt notified.

## 2016-10-03 ENCOUNTER — Encounter: Payer: Self-pay | Admitting: Family Medicine

## 2016-10-03 ENCOUNTER — Telehealth: Payer: Self-pay | Admitting: Family Medicine

## 2016-10-03 ENCOUNTER — Ambulatory Visit (INDEPENDENT_AMBULATORY_CARE_PROVIDER_SITE_OTHER): Payer: BLUE CROSS/BLUE SHIELD | Admitting: Family Medicine

## 2016-10-03 VITALS — BP 116/77 | HR 79 | Temp 98.0°F | Ht 63.0 in | Wt 251.0 lb

## 2016-10-03 DIAGNOSIS — M1A09X0 Idiopathic chronic gout, multiple sites, without tophus (tophi): Secondary | ICD-10-CM

## 2016-10-03 DIAGNOSIS — Z13220 Encounter for screening for lipoid disorders: Secondary | ICD-10-CM | POA: Diagnosis not present

## 2016-10-03 DIAGNOSIS — R635 Abnormal weight gain: Secondary | ICD-10-CM

## 2016-10-03 LAB — LIPID PANEL W/REFLEX DIRECT LDL
CHOL/HDL RATIO: 7 ratio — AB (ref <5.0)
Cholesterol: 209 mg/dL — ABNORMAL HIGH (ref <200)
HDL: 30 mg/dL — AB (ref >50)
LDL-CHOLESTEROL: 149 mg/dL — AB
Non-HDL Cholesterol (Calc): 179 mg/dL — ABNORMAL HIGH (ref <130)
Triglycerides: 161 mg/dL — ABNORMAL HIGH (ref <150)

## 2016-10-03 LAB — TSH: TSH: 2.16 mIU/L

## 2016-10-03 NOTE — Telephone Encounter (Signed)
I called patient to inform them that when I called [facility] to request pap smear results they told me they have no record of ever doing a pap smear on her.

## 2016-10-03 NOTE — Patient Instructions (Signed)
Please remove her to call your OB/GYN to schedule your Pap smear as this does need to be updated.

## 2016-10-03 NOTE — Progress Notes (Signed)
Subjective:    CC: Abnormal weight gain  HPI:  Abnormal weight gain - 36 year old female with diabetes comes in today because she's having difficulty losing weight.she has been exercising 3 days per week.  She has been trying to cut out carbs with her diet.  She is having a hard time getting out of bed.   She has cut out soda and sweet tea. She did some nutrition counseling about a month ago.  Says her goutOverall. She is now up to 400 mg on her allopurinol and her last uric acid level was 6.9 about a week ago. She's been using her fit bit to help track calories. Currently except for around 1500 cal per day but she is actually been trying to shoot for between 1200 and 1300 cal per day. She actually has another follow-up with the nutritionist in about 6 weeks.  Past medical history, Surgical history, Family history not pertinant except as noted below, Social history, Allergies, and medications have been entered into the medical record, reviewed, and corrections made.   Review of Systems: No fevers, chills, night sweats, weight loss, chest pain, or shortness of breath.   Objective:    General: Well Developed, well nourished, and in no acute distress.  Neuro: Alert and oriented x3, extra-ocular muscles intact, sensation grossly intact.  HEENT: Normocephalic, atraumatic  Skin: Warm and dry, no rashes. Cardiac: Regular rate and rhythm, no murmurs rubs or gallops, no lower extremity edema.  Respiratory: Clear to auscultation bilaterally. Not using accessory muscles, speaking in full sentences.   Impression and Recommendations:    Abnormal weight gain - I do think overall she is actually made some great changes and cutting episode and sweet tea and try to eat a low carb diet. She's also been exercising 3 days week. Which is fantastic. On her home scale she has not lost anything but interestingly per our scale she is actually lost about 8 pounds since December which is really fantastic. We will check  her thyroid today. Encouraged her to discontinue with the changes that she made, this is fantastic. Keep follow-up with nutritionist and encouraged her to take her fit that information with her to the appointment so that they can make some suggestions and recommendations. Also encouraged her to make sure that she is getting adequate protein and to try increasing her exercise.  Gout -  overall well controlled. Continue current regimen. Uric acid was 6.9. They may adjust her allopurinol again but she is not sure.

## 2016-10-04 DIAGNOSIS — M1A0621 Idiopathic chronic gout, left knee, with tophus (tophi): Secondary | ICD-10-CM | POA: Insufficient documentation

## 2016-10-24 ENCOUNTER — Ambulatory Visit (INDEPENDENT_AMBULATORY_CARE_PROVIDER_SITE_OTHER): Payer: BLUE CROSS/BLUE SHIELD | Admitting: Family Medicine

## 2016-10-24 ENCOUNTER — Encounter: Payer: Self-pay | Admitting: Family Medicine

## 2016-10-24 VITALS — BP 121/78 | HR 81 | Ht 63.0 in | Wt 250.0 lb

## 2016-10-24 DIAGNOSIS — E1165 Type 2 diabetes mellitus with hyperglycemia: Secondary | ICD-10-CM

## 2016-10-24 DIAGNOSIS — G4769 Other sleep related movement disorders: Secondary | ICD-10-CM

## 2016-10-24 DIAGNOSIS — G4700 Insomnia, unspecified: Secondary | ICD-10-CM | POA: Diagnosis not present

## 2016-10-24 DIAGNOSIS — I10 Essential (primary) hypertension: Secondary | ICD-10-CM | POA: Diagnosis not present

## 2016-10-24 DIAGNOSIS — R400 Somnolence: Secondary | ICD-10-CM

## 2016-10-24 DIAGNOSIS — IMO0001 Reserved for inherently not codable concepts without codable children: Secondary | ICD-10-CM

## 2016-10-24 LAB — POCT GLYCOSYLATED HEMOGLOBIN (HGB A1C): HEMOGLOBIN A1C: 6.2

## 2016-10-24 NOTE — Progress Notes (Signed)
Subjective:    CC: DM, HTN  HPI:  Diabetes - no hypoglycemic events. No wounds or sores that are not healing well. No increased thirst or urination. Checking glucose at home. Taking medications as prescribed without any side effects.  Lab Results  Component Value Date   HGBA1C 8.3 07/25/2016   Hypertension- Pt denies chest pain, SOB, dizziness, or heart palpitations.  Taking meds as directed w/o problems.  Denies medication side effects.     Follow-up obesity-last some I saw her in February she been really trying hard to lose weight. She been exercising 3 days a week which is fantastic and per our scale she actually lost 8 pounds since December. Encouraged her to keep her follow-up with the nutritionist.  Insomnia - New onset over the last month.  She kicks a lot at night. No snoring but doesn't grunt.  No gasping.  No fam hx of sleep apnea.  Says just tosses and turns. Occ feels like her mind races but not often.   Gout - Has tried OTC medication. NO caffeine. Tries to go to bed about the same time.  Sometime just tosses and turns.    Past medical history, Surgical history, Family history not pertinant except as noted below, Social history, Allergies, and medications have been entered into the medical record, reviewed, and corrections made.   Review of Systems: No fevers, chills, night sweats, weight loss, chest pain, or shortness of breath.   Objective:    General: Well Developed, well nourished, and in no acute distress.  Neuro: Alert and oriented x3, extra-ocular muscles intact, sensation grossly intact.  HEENT: Normocephalic, atraumatic  Skin: Warm and dry, no rashes. Cardiac: Regular rate and rhythm, no murmurs rubs or gallops, no lower extremity edema.  Respiratory: Clear to auscultation bilaterally. Not using accessory muscles, speaking in full sentences.   Impression and Recommendations:   DM- Hemoccult A1c down to 6.2 from previous of 8 which is fantastic. Great  progress. I do think her dietary changes and working with a nutritionist is made a big impact even though it really hasn't made a big change in her weight. Well controlled. Continue current regimen. Follow up in  3 mohthsl   HTN - Well controlled. Continue current regimen. Follow up in  3-4 months.   Obesity/ BMI  - she really isn't making a lot of progress, she's only lost 1 pound in the last month and feels like she is counting her calories and following with the nutritionist has recommended I recommended a bariatric program that's more comprehensive for her think she would be a great candidate and would do really well. She's not interested in eating in surgical options at this point.  Insomnia - New onset x 1 month with unknown triggers.  She did mention some grunting at night but no snoring and did mention moving her legs a lot which could be consistent with periodic limb movement as noticed by her husband. I did go ahead and have her do a stopping screening questionnaire today. Her score was for positive out of 8. She had positive score for feeling fatigued, hypertension, elevated BMI and neck circumference. Thus I think it may be worth doing a sleep study on her in the labs we can also evaluate for PLM.

## 2016-10-24 NOTE — Patient Instructions (Signed)
Please schedule your pap smear.   

## 2016-11-02 ENCOUNTER — Encounter: Payer: Self-pay | Admitting: Family Medicine

## 2016-11-06 ENCOUNTER — Other Ambulatory Visit: Payer: Self-pay | Admitting: Family Medicine

## 2016-11-06 NOTE — Addendum Note (Signed)
Addended by: Teddy Spike on: 11/06/2016 11:29 AM   Modules accepted: Orders

## 2016-11-08 ENCOUNTER — Other Ambulatory Visit: Payer: Self-pay | Admitting: Family Medicine

## 2016-11-08 DIAGNOSIS — R0683 Snoring: Secondary | ICD-10-CM

## 2016-11-08 DIAGNOSIS — G479 Sleep disorder, unspecified: Secondary | ICD-10-CM

## 2016-11-08 NOTE — Progress Notes (Signed)
OK will initialy PA for sleep study.

## 2016-11-09 ENCOUNTER — Telehealth: Payer: Self-pay | Admitting: *Deleted

## 2016-11-09 DIAGNOSIS — R0683 Snoring: Secondary | ICD-10-CM

## 2016-11-09 DIAGNOSIS — G479 Sleep disorder, unspecified: Secondary | ICD-10-CM

## 2016-11-09 NOTE — Telephone Encounter (Signed)
Home sleep study approved. [facility] to verify that they will call the patient to schedule equipment pick up. Released order and also faxed it along with demographic to [phone]. Spoke with Kya there at [facility] who said it was good to also fax order.   Approval # [id number]

## 2016-11-30 ENCOUNTER — Encounter: Payer: Self-pay | Admitting: Family Medicine

## 2016-12-01 MED ORDER — INDOMETHACIN 50 MG PO CAPS: 50.0000 mg | ORAL_CAPSULE | Freq: Two times a day (BID) | ORAL | 0 refills | Status: DC

## 2016-12-01 NOTE — Telephone Encounter (Signed)
Rx sent. Left VM to advise Pt.

## 2016-12-04 LAB — HM DIABETES EYE EXAM

## 2016-12-29 ENCOUNTER — Encounter: Payer: Self-pay | Admitting: Family Medicine

## 2016-12-29 DIAGNOSIS — E669 Obesity, unspecified: Secondary | ICD-10-CM

## 2016-12-29 DIAGNOSIS — G4769 Other sleep related movement disorders: Secondary | ICD-10-CM

## 2016-12-29 DIAGNOSIS — R0683 Snoring: Secondary | ICD-10-CM

## 2016-12-29 DIAGNOSIS — Z9189 Other specified personal risk factors, not elsewhere classified: Secondary | ICD-10-CM

## 2016-12-29 DIAGNOSIS — R400 Somnolence: Secondary | ICD-10-CM

## 2016-12-29 DIAGNOSIS — G4700 Insomnia, unspecified: Secondary | ICD-10-CM

## 2017-01-03 ENCOUNTER — Encounter (HOSPITAL_BASED_OUTPATIENT_CLINIC_OR_DEPARTMENT_OTHER): Payer: BLUE CROSS/BLUE SHIELD

## 2017-01-03 NOTE — Telephone Encounter (Signed)
Order placed

## 2017-01-12 ENCOUNTER — Encounter: Payer: Self-pay | Admitting: Family Medicine

## 2017-01-14 ENCOUNTER — Other Ambulatory Visit: Payer: Self-pay | Admitting: Family Medicine

## 2017-02-01 ENCOUNTER — Ambulatory Visit (INDEPENDENT_AMBULATORY_CARE_PROVIDER_SITE_OTHER): Payer: BLUE CROSS/BLUE SHIELD | Admitting: Family Medicine

## 2017-02-01 ENCOUNTER — Encounter: Payer: Self-pay | Admitting: Family Medicine

## 2017-02-01 VITALS — BP 130/90 | HR 100 | Ht 62.6 in | Wt 251.0 lb

## 2017-02-01 DIAGNOSIS — M1A0621 Idiopathic chronic gout, left knee, with tophus (tophi): Secondary | ICD-10-CM

## 2017-02-01 DIAGNOSIS — I10 Essential (primary) hypertension: Secondary | ICD-10-CM | POA: Diagnosis not present

## 2017-02-01 DIAGNOSIS — E1165 Type 2 diabetes mellitus with hyperglycemia: Secondary | ICD-10-CM | POA: Diagnosis not present

## 2017-02-01 DIAGNOSIS — Z6841 Body Mass Index (BMI) 40.0 and over, adult: Secondary | ICD-10-CM

## 2017-02-01 DIAGNOSIS — G243 Spasmodic torticollis: Secondary | ICD-10-CM | POA: Diagnosis not present

## 2017-02-01 DIAGNOSIS — IMO0001 Reserved for inherently not codable concepts without codable children: Secondary | ICD-10-CM

## 2017-02-01 LAB — POCT UA - MICROALBUMIN
Creatinine, POC: 100 mg/dL
Microalbumin Ur, POC: 80 mg/L

## 2017-02-01 LAB — POCT GLYCOSYLATED HEMOGLOBIN (HGB A1C): HEMOGLOBIN A1C: 6.5

## 2017-02-01 MED ORDER — AMLODIPINE BESYLATE 5 MG PO TABS: 5.0000 mg | ORAL_TABLET | Freq: Every day | ORAL | 0 refills | Status: DC

## 2017-02-01 NOTE — Progress Notes (Addendum)
Subjective:    CC: DM, HTN   HPI:  Diabetes - no hypoglycemic events. No wounds or sores that are not healing well. No increased thirst or urination. Checking glucose at home. Taking medications as prescribed without any side effects.  Hypertension- Pt denies chest pain, SOB, dizziness, or heart palpitations.  Taking meds as directed w/o problems.  Denies medication side effects.    Gout - No recent flares or exacerbations. Currently on Uloric.   BMI 45-she did go see the bariatric nutrition. She is not going to be a candidate for medications but did meet with the nutritionist to set some goals.  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 She does find that they're helpful.  Past medical history, Surgical history, Family history not pertinant except as noted below, Social history, Allergies, and medications have been entered into the medical record, reviewed, and corrections made.   Review of Systems: No fevers, chills, night sweats, weight loss, chest pain, or shortness of breath.   Objective:    General: Well Developed, well nourished, and in no acute distress.  Neuro: Alert and oriented x3, extra-ocular muscles intact, sensation grossly intact.  HEENT: Normocephalic, atraumatic  Skin: Warm and dry, no rashes. Cardiac: Regular rate and rhythm, no murmurs rubs or gallops, no lower extremity edema.  Respiratory: Clear to auscultation bilaterally. Not using accessory muscles, speaking in full sentences.   Impression and Recommendations:    DM- Well controlled. Urine micral albumin does show some proteinuria today. No known renal impairment that she is due for repeat creatinine. Lab slip provided. Need to consider starting ACE inhibitor help protect the kidney function. Continue current regimen. Follow up in  3-4 months.   HTN - Diastolic still elevated on labs. Will increase Norvasc to 5 mg. She is Artie on nifedipine so want to be careful for increased  potential for side effects. We really need to work on getting this diastolic decreased. Continue work on [employer] regular exercise and weight loss.   Gout - stable. Well-controlled. Continue current regimen. Continue Uloric.   BMI 45-continue work on healthy diet and regular exercise is recommended by the nutritionist. If that even if she can lose 2 pounds a month, which are between 4 pounds year would make a big difference in controlling her blood pressure etc. Follow-up in 3-4 months.  Spasmodic torticollis-gets Botox every 3 months.

## 2017-02-02 ENCOUNTER — Telehealth: Payer: Self-pay | Admitting: Family Medicine

## 2017-02-02 MED ORDER — LISINOPRIL 10 MG PO TABS: 10.0000 mg | ORAL_TABLET | Freq: Every day | ORAL | 3 refills | Status: DC

## 2017-02-02 NOTE — Telephone Encounter (Signed)
Left VM with information and call back number for questions.

## 2017-02-02 NOTE — Addendum Note (Signed)
Addended by: Beatrice Lecher D on: 02/02/2017 07:16 AM   Modules accepted: Orders

## 2017-02-02 NOTE — Telephone Encounter (Signed)
Please call patient and let her know that yesterday her urinalysis did show some protein. Based on current guidelines recommendation is for her to start a low-dose ACE inhibitor which is actually a type of blood pressure pill but when it does is help protect the kidney function in people who have diabetes if they start spilling some protein in the urine. I went ahead and sent over a low-dose prescription for her to try called lisinopril. We'll try over the next 3 months and see how she does with it. I know we had also increased her amlodipine to help with blood pressure. Says the combination of the 2 seems to strong the please have her call me back and we can make some adjustments.

## 2017-02-03 ENCOUNTER — Telehealth: Payer: Self-pay

## 2017-02-03 NOTE — Telephone Encounter (Signed)
Coralyn Mark from the [facility] called and states [employer] requires a Peer to Peer for a sleep study.   Peer to Peer phone number [phone]

## 2017-02-04 NOTE — Telephone Encounter (Signed)
Will they cover at home study?

## 2017-02-05 NOTE — Telephone Encounter (Signed)
Will require appeal. Please initial appeal.

## 2017-02-05 NOTE — Telephone Encounter (Signed)
Spoke w/cindy this is for a home sleep study.Audelia Hives [place]

## 2017-02-06 NOTE — Telephone Encounter (Signed)
I called to initiate the appeal and had to leave a message for them to call me - CF

## 2017-02-06 NOTE — Telephone Encounter (Signed)
[facility] call pt and let her know we did an appeal for her sleep study. Her insuranc ejust wont' cover it again. unfortunatelt the initial authorization from April expired. We will have to wait until Jan to try again.

## 2017-02-08 ENCOUNTER — Other Ambulatory Visit: Payer: Self-pay | Admitting: Family Medicine

## 2017-02-08 NOTE — Telephone Encounter (Signed)
Recommendations left on vm -EH/RMA  

## 2017-02-14 ENCOUNTER — Encounter: Payer: Self-pay | Admitting: Family Medicine

## 2017-03-21 ENCOUNTER — Telehealth: Payer: Self-pay | Admitting: Family Medicine

## 2017-03-21 NOTE — Telephone Encounter (Signed)
lvm informing pt of recommendations and asked that she call their office to schedule an appt.Brenda Sharp [place]

## 2017-03-21 NOTE — Telephone Encounter (Signed)
Please call patient and let her know that we did contact [facility]. She has not had a recent Pap smear and is overdue. Please encourage her to go ahead and call them and get scheduled for a yearly GYN exam.

## 2017-03-29 ENCOUNTER — Other Ambulatory Visit: Payer: Self-pay | Admitting: Family Medicine

## 2017-04-03 ENCOUNTER — Telehealth: Payer: Self-pay | Admitting: *Deleted

## 2017-04-03 NOTE — Telephone Encounter (Signed)
Indomethacin 50mg  is not available at any [facility]. Called and lvm asking pt if she would like for this to be sent to another pharmacy or if ok to be changed to something else.   Advised to rtn call.Audelia Hives [place]

## 2017-06-04 ENCOUNTER — Encounter: Payer: Self-pay | Admitting: Family Medicine

## 2017-06-04 ENCOUNTER — Ambulatory Visit (INDEPENDENT_AMBULATORY_CARE_PROVIDER_SITE_OTHER): Payer: BLUE CROSS/BLUE SHIELD | Admitting: Family Medicine

## 2017-06-04 VITALS — BP 138/72 | HR 90 | Temp 98.2°F | Ht 63.0 in | Wt 253.0 lb

## 2017-06-04 DIAGNOSIS — M79662 Pain in left lower leg: Secondary | ICD-10-CM | POA: Diagnosis not present

## 2017-06-04 DIAGNOSIS — E038 Other specified hypothyroidism: Secondary | ICD-10-CM | POA: Diagnosis not present

## 2017-06-04 DIAGNOSIS — M7989 Other specified soft tissue disorders: Secondary | ICD-10-CM

## 2017-06-04 DIAGNOSIS — R21 Rash and other nonspecific skin eruption: Secondary | ICD-10-CM | POA: Diagnosis not present

## 2017-06-04 DIAGNOSIS — M1A0621 Idiopathic chronic gout, left knee, with tophus (tophi): Secondary | ICD-10-CM | POA: Diagnosis not present

## 2017-06-04 DIAGNOSIS — E1165 Type 2 diabetes mellitus with hyperglycemia: Secondary | ICD-10-CM

## 2017-06-04 LAB — POCT GLYCOSYLATED HEMOGLOBIN (HGB A1C): HEMOGLOBIN A1C: 6.9

## 2017-06-04 MED ORDER — TRIAMCINOLONE ACETONIDE 0.1 % EX CREA: 1.0000 "application " | TOPICAL_CREAM | Freq: Every day | CUTANEOUS | 0 refills | Status: DC | PRN

## 2017-06-04 NOTE — Progress Notes (Signed)
Subjective:    CC: DM   HPI:  Diabetes - no hypoglycemic events. No wounds or sores that are not healing well. No increased thirst or urination. Checking glucose at home. Taking medications as prescribed without any side effects.  He does exercise on the stationary bike 3 days/week.    Gout-overall she is doing well.  Still occasional flares but not nearly what she was getting previously.  She says now if she starts to get any sign of a flare she will go ahead and take her Indocin and that actually seems to be working really well for her.  Hypothyroid is him-currently taking 112 mcg daily.  No recent changes.  She says she is taking it consistently.  No significant weight changes.  Wants me to look at her left lower leg.  Is been swelling over the anterior shin.  She thought initially it could be shin splints but she really has not changed her exercise routine.  She also noticed that she was getting a little bit of a rash over the lower leg that is itchy at times.  She denies any known injury or trauma and not really getting any swelling over her right lower leg.  BP 138/72   Pulse 90   Temp 98.2 F (36.8 C)   Ht 5\' 3"  (1.6 m)   Wt 253 lb (114.8 kg)   SpO2 100%   BMI 44.82 kg/m     Allergies  Allergen Reactions  . Penicillins     Other reaction(s): Other migraines  . Colchicine Nausea Only  . Imitrex [Sumatriptan]     Chest pain  . Januvia [Sitagliptin] Other (See Comments)    Nausea, GI upset    Past Medical History:  Diagnosis Date  . Gout   . Headache(784.0)   . Hypertension   . Hypothyroidism   . Kidney stone   . Miscarriage   . PCOS (polycystic ovarian syndrome)   . Torticollis     Past Surgical History:  Procedure Laterality Date  . LITHOTRIPSY    . TONSILLECTOMY  1990  . WISDOM TOOTH EXTRACTION  2006    Social History   Social History  . Marital status: Married    Spouse name: Ed  . Number of children: 0  . Years of education: college    Occupational History  . 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 Smoking status: Never Smoker  . Smokeless tobacco: Never Used  . Alcohol use No  . Drug use: No  . Sexual activity: Yes    Partners: Male   Other Topics Concern  . Not on file   Social History Narrative   1 cup caffeine/day   Right-handed.   Lives at home with husband.    Family History  Problem Relation Age of Onset  . Lung cancer Paternal Grandfather        smoker  . Heart attack Paternal Grandmother   . Heart attack Paternal Grandfather   . Heart attack Maternal Grandfather   . Hypertension Father   . Gout Father   . Depression Mother     Outpatient Encounter Prescriptions as of 06/04/2017  Medication Sig  . amLODipine (NORVASC) 5 MG tablet Take 1 tablet (5 mg total) by mouth daily.  . DULoxetine (CYMBALTA) 60 MG capsule TAKE ONE CAPSULE BY MOUTH TWICE DAILY  . febuxostat (ULORIC) 40 MG tablet Take 40 mg by mouth daily.  . indomethacin (INDOCIN) 50 MG capsule  TAKE ONE CAPSULE BY MOUTH TWICE DAILY, WITH A MEAL  . labetalol (NORMODYNE) 300 MG tablet TAKE 1 TABLET(300 MG) BY MOUTH TWICE DAILY  . levothyroxine (SYNTHROID, LEVOTHROID) 112 MCG tablet TAKE 1 TABLET(112 MCG) BY MOUTH DAILY  . lisinopril (PRINIVIL,ZESTRIL) 10 MG tablet Take 1 tablet (10 mg total) by mouth daily.  . metFORMIN (GLUCOPHAGE-XR) 500 MG 24 hr tablet Take 2 tablets (1,000 mg total) by mouth daily with breakfast.  . NIFEdipine (PROCARDIA-XL/ADALAT-CC/NIFEDICAL-XL) 30 MG 24 hr tablet TAKE 1 TABLET(30 MG) BY MOUTH DAILY  . saxagliptin HCl (ONGLYZA) 5 MG TABS tablet Take 1 tablet (5 mg total) by mouth daily.  Marland Kitchen triamcinolone cream (KENALOG) 0.1 % Apply 1 application topically daily as needed.   No facility-administered encounter medications on file as of 06/04/2017.        Objective:    General: Well Developed, well nourished, and in no acute distress.  Neuro: Alert and oriented x3, extra-ocular muscles intact,  sensation grossly intact.  HEENT: Normocephalic, atraumatic  Skin: Warm and dry, no rashes. Cardiac: Regular rate and rhythm, no murmurs rubs or gallops, no lower extremity edema.  Respiratory: Clear to auscultation bilaterally. Not using accessory muscles, speaking in full sentences. MSK: She has some trace edema on the left ankle but 1+ pitting edema over the anterior tibia.  She has several excoriations over the left lower leg but no sign of cellulitis or infection.  Trace edema over the right lower leg.   Impression and Recommendations:    DM-A1c up to 6.9 today.  Previous was 6.5 which was fantastic.  We discussed several different options for medications.  She is currently on metformin and Onglyza.  We discussed working on diet and exercise over the next 3 months to try to get it back down as she was successful just 4 months ago.  I will see her back in 3 months.  If at that point she is not successful then consider Invokana or [place].  She is not interested in an injection at this time.  Gout-overall improving with fewer flares.  Continue with Uloric.  Unclear etiology.  Most looks like pretibial myxedema to some degree so we will check her thyroid level.  She really does not have but trace swelling on the right lower extremity which is unusual and just trace swelling in the right ankle and foot.   Left lower leg swelling-  Most looks like pretibial myxedema to some degree so we will check her thyroid level.  She really does not have but trace swelling on the right lower extremity which is unusual and just trace swelling in the right ankle and foot.  recommend elevation.  Ice as needed and okay to use topical steroid on the rash.  Hypothyroidism- due for recheck on thyroid.  Call [facility] for most recent Pap smear.  Rash-we will treat with topical steroid cream to improve itching so that she is not scratching at the area.

## 2017-06-05 LAB — TSH: TSH: 3.25 mIU/L

## 2017-07-01 ENCOUNTER — Other Ambulatory Visit: Payer: Self-pay | Admitting: Family Medicine

## 2017-08-09 ENCOUNTER — Other Ambulatory Visit: Payer: Self-pay | Admitting: Family Medicine

## 2017-08-28 ENCOUNTER — Ambulatory Visit: Payer: BLUE CROSS/BLUE SHIELD | Admitting: Family Medicine

## 2017-09-03 ENCOUNTER — Telehealth: Payer: Self-pay | Admitting: Family Medicine

## 2017-09-03 NOTE — Telephone Encounter (Signed)
Yes this will be fine. Thank you for asking.Brenda Sharp, Brenda Sharp, CMA

## 2017-09-03 NOTE — Telephone Encounter (Signed)
Need Blood pressure check, A1C check, sleep study referral, infertility/OBGYN in [facility] referral instead of [place]. Brenda Sharp, patient is wanting all of this done on 2/19 is this okay for a 20 min slot?

## 2017-09-03 NOTE — Telephone Encounter (Signed)
OK THANKS   

## 2017-09-11 ENCOUNTER — Other Ambulatory Visit: Payer: Self-pay | Admitting: Family Medicine

## 2017-09-25 ENCOUNTER — Encounter: Payer: Self-pay | Admitting: Family Medicine

## 2017-09-25 ENCOUNTER — Ambulatory Visit: Payer: BC Managed Care – PPO | Admitting: Family Medicine

## 2017-09-25 VITALS — BP 136/72 | HR 94 | Ht 63.0 in | Wt 254.0 lb

## 2017-09-25 DIAGNOSIS — I10 Essential (primary) hypertension: Secondary | ICD-10-CM

## 2017-09-25 DIAGNOSIS — N979 Female infertility, unspecified: Secondary | ICD-10-CM

## 2017-09-25 DIAGNOSIS — E1165 Type 2 diabetes mellitus with hyperglycemia: Secondary | ICD-10-CM

## 2017-09-25 DIAGNOSIS — N200 Calculus of kidney: Secondary | ICD-10-CM | POA: Insufficient documentation

## 2017-09-25 DIAGNOSIS — R0683 Snoring: Secondary | ICD-10-CM | POA: Diagnosis not present

## 2017-09-25 LAB — POCT GLYCOSYLATED HEMOGLOBIN (HGB A1C): Hemoglobin A1C: 6.7

## 2017-09-25 MED ORDER — NIFEDIPINE ER OSMOTIC RELEASE 30 MG PO TB24: ORAL_TABLET | ORAL | 3 refills | Status: DC

## 2017-09-25 MED ORDER — LEVOTHYROXINE SODIUM 112 MCG PO TABS: ORAL_TABLET | ORAL | 3 refills | Status: DC

## 2017-09-25 MED ORDER — INDOMETHACIN 50 MG PO CAPS: ORAL_CAPSULE | ORAL | 3 refills | Status: DC

## 2017-09-25 MED ORDER — DULAGLUTIDE 0.75 MG/0.5ML ~~LOC~~ SOAJ: 0.7500 mg | SUBCUTANEOUS | 5 refills | Status: DC

## 2017-09-25 NOTE — Patient Instructions (Addendum)
After 1 week of starting the Trulicity then you can stop the Onglyza.  Make sure to continue the metformin though. Good job on exercising regularly. We will work on try to get the sleep study ordered.

## 2017-09-25 NOTE — Progress Notes (Signed)
Subjective:    CC: BP, DM  HPI:  Hypertension- Pt denies chest pain, SOB, dizziness, or heart palpitations.  Taking meds as directed w/o problems.  Denies medication side effects.    Diabetes - no hypoglycemic events. No wounds or sores that are not healing well. No increased thirst or urination. Checking glucose at home. Taking medications as prescribed without any side effects.  Is been trying to exercise 3 days/week but has not been able to lose weight.  She would also like a new OB/GYN referral for infertility.  She is would also like to try to get a sleep study scheduled again.  And lab if preferable.  Last year her insurance would not cover it but she has a new insurance plan this year.   Past medical history, Surgical history, Family history not pertinant except as noted below, Social history, Allergies, and medications have been entered into the medical record, reviewed, and corrections made.   Review of Systems: No fevers, chills, night sweats, weight loss, chest pain, or shortness of breath.   Objective:    General: Well Developed, well nourished, and in no acute distress.  Neuro: Alert and oriented x3, extra-ocular muscles intact, sensation grossly intact.  HEENT: Normocephalic, atraumatic  Skin: Warm and dry, no rashes. Cardiac: Regular rate and rhythm, no murmurs rubs or gallops, no lower extremity edema.  Respiratory: Clear to auscultation bilaterally. Not using accessory muscles, speaking in full sentences.   Impression and Recommendations:    HTN - Well controlled. Continue current regimen. Follow up in  3 months.    DM -globin A1c of 6.7 today which is improved from previous.  We will continue with current regimen.  We will try switching her to Trulicity to see if this may help some with weight loss instead of the onglyza.  Infertility.  Place referral.  Patient prefers [facility].  Snoring-we will try to see if we can get an in lab sleep study ordered  again.

## 2017-10-13 ENCOUNTER — Ambulatory Visit (HOSPITAL_BASED_OUTPATIENT_CLINIC_OR_DEPARTMENT_OTHER): Payer: BC Managed Care – PPO | Attending: Family Medicine | Admitting: Internal Medicine

## 2017-10-13 VITALS — Ht 63.0 in | Wt 250.0 lb

## 2017-10-13 DIAGNOSIS — G4733 Obstructive sleep apnea (adult) (pediatric): Secondary | ICD-10-CM

## 2017-10-13 DIAGNOSIS — R0683 Snoring: Secondary | ICD-10-CM | POA: Diagnosis present

## 2017-10-27 DIAGNOSIS — G4733 Obstructive sleep apnea (adult) (pediatric): Secondary | ICD-10-CM | POA: Diagnosis not present

## 2017-10-27 NOTE — Procedures (Signed)
Patient Name: Brenda Sharp, Brenda Sharp Date: 10/13/2017 Gender: Female D.O.B: 04-29-1981 Age (years): 36 Referring Provider: Beatrice Lecher Height (inches): 63 Interpreting Physician: Baird Lyons MD, ABSM Weight (lbs): 250 RPSGT: Earney [place] BMI: 44 MRN: [id number] Neck Size: 17.50  CLINICAL INFORMATION Sleep Study Type: NPSG  Indication for sleep study: Snoring  Epworth Sleepiness Score: 10  SLEEP STUDY TECHNIQUE As per the AASM Manual for the Scoring of Sleep and Associated Events v2.3 (April 2016) with a hypopnea requiring 4% desaturations.  The channels recorded and monitored were frontal, central and occipital EEG, electrooculogram (EOG), submentalis EMG (chin), nasal and oral airflow, thoracic and abdominal wall motion, anterior tibialis EMG, snore microphone, electrocardiogram, and pulse oximetry.  MEDICATIONS Medications self-administered by patient taken the night of the study : none reported  SLEEP ARCHITECTURE The study was initiated at 9:43:34 PM and ended at 4:08:38 AM.  Sleep onset time was 78.4 minutes and the sleep efficiency was 54.3%%. The total sleep time was 209.0 minutes.  Stage REM latency was N/A minutes.  The patient spent 5.7%% of the night in stage N1 sleep, 77.3%% in stage N2 sleep, 17.0%% in stage N3 and 0.00% in REM.  Alpha intrusion was absent.  Supine sleep was 63.40%.  RESPIRATORY PARAMETERS The overall apnea/hypopnea index (AHI) was 10.3 per hour. There were 14 total apneas, including 14 obstructive, 0 central and 0 mixed apneas. There were 22 hypopneas and 6 RERAs.  The AHI during Stage REM sleep was N/A per hour.  AHI while supine was 14.9 per hour.  The mean oxygen saturation was 94.4%. The minimum SpO2 during sleep was 89.0%.  moderate snoring was noted during this study.  CARDIAC DATA The 2 lead EKG demonstrated sinus rhythm. The mean heart rate was 71.1 beats per minute. Other EKG findings include: None.  LEG  MOVEMENT DATA The total PLMS were 0 with a resulting PLMS index of 0.0. Associated arousal with leg movement index was 1.1 .  IMPRESSIONS - Mild obstructive sleep apnea occurred during this study (AHI = 10.3/h). - Insufficient early sleep and events to meet protocol requirements for split CPAPtitration. - No significant central sleep apnea occurred during this study (CAI = 0.0/h). - The patient had minimal or no oxygen desaturation during the study (Min O2 = 89.0%) - The patient snored with moderate snoring volume. - No cardiac abnormalities were noted during this study. - Clinically significant periodic limb movements did not occur during sleep. No significant associated arousals. - Patient had difficulty initiating and maintaining sleep, and spent a lot of time on her phone, which can add to sleep disturbance.  DIAGNOSIS - Obstructive Sleep Apnea (327.23 [G47.33 ICD-10]  RECOMMENDATIONS - Consider CPAP titration study, AutoPAP, or a fitted oral appliance, Other options would be based on clincal judgment. - Be careful with alcohol, sedatives and other CNS depressants that may worsen sleep apnea and disrupt normal sleep architecture. - Sleep hygiene should be reviewed to assess factors that may improve sleep quality. - Weight management and regular exercise should be initiated or continued if appropriate.  [Electronically signed] 10/27/2017 11:24 AM  Baird Lyons MD, ABSM Diplomate, American Board of Sleep Medicine   NPI: [id number]                          [place], [place] [facility]  ELECTRONICALLY SIGNED ON:  10/27/2017, 11:19 AM [place]: [phone]   FX: [phone] ACCREDITED  BY THE AMERICAN ACADEMY OF SLEEP MEDICINE

## 2017-11-09 ENCOUNTER — Ambulatory Visit: Payer: BC Managed Care – PPO | Admitting: Family Medicine

## 2017-11-09 ENCOUNTER — Encounter: Payer: Self-pay | Admitting: Family Medicine

## 2017-11-09 VITALS — BP 138/88 | HR 87 | Ht 63.0 in | Wt 245.0 lb

## 2017-11-09 DIAGNOSIS — G4733 Obstructive sleep apnea (adult) (pediatric): Secondary | ICD-10-CM | POA: Diagnosis not present

## 2017-11-09 DIAGNOSIS — N979 Female infertility, unspecified: Secondary | ICD-10-CM | POA: Diagnosis not present

## 2017-11-09 DIAGNOSIS — E1165 Type 2 diabetes mellitus with hyperglycemia: Secondary | ICD-10-CM | POA: Diagnosis not present

## 2017-11-09 MED ORDER — AMBULATORY NON FORMULARY MEDICATION: 0 refills | Status: DC

## 2017-11-09 NOTE — Progress Notes (Signed)
Subjective:    Patient ID: Brenda Sharp, female    DOB: 09-Dec-1980, 37 y.o.   MRN: [id number]  HPI   37 yo female is to go over her sleep study report.  We initially tried to order the test last year but had difficulty getting it covered by insurance so we were able to get it done this year.  She complained about daytime fatigue and somnolence as well as significant snoring at night.  Diabetes-she says she is been on the Trulicity for a little over a month and unfortunately it has caused persistent nausea.  She says she stays severely nauseated for about 5 days after the injection it has not gotten better as the month has progressed.  She has lost 9 pounds though which is great.    Infertility-she is back on fertility treatments for pregnancy and wants to make sure that her medications are safe to take in case she gets pregnant.  She knows she will likely need to be weaned off the Cymbalta if she becomes pregnant.     Review of Systems  BP 138/88   Pulse 87   Ht 5\' 3"  (1.6 m)   Wt 245 lb (111.1 kg)   SpO2 100%   BMI 43.40 kg/m     Allergies  Allergen Reactions  . Penicillins     Other reaction(s): Other migraines  . Colchicine Nausea Only  . Imitrex [Sumatriptan]     Chest pain  . Januvia [Sitagliptin] Other (See Comments)    Nausea, GI upset    Past Medical History:  Diagnosis Date  . Gout   . Headache(784.0)   . Hypertension   . Hypothyroidism   . Kidney stone   . Miscarriage   . PCOS (polycystic ovarian syndrome)   . Torticollis     Past Surgical History:  Procedure Laterality Date  . LITHOTRIPSY    . TONSILLECTOMY  1990  . WISDOM TOOTH EXTRACTION  2006    Social History   Socioeconomic History  . Marital status: Married    Spouse name: Ed  . Number of children: 0  . Years of education: college  . Highest education level: Not on file  Occupational History  . 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 Financial resource strain:  Not on file  . Food insecurity:    Worry: Not on file    Inability: Not on file  . Transportation needs:    Medical: Not on file    Non-medical: Not on file  Tobacco Use  . Smoking status: Never Smoker  . Smokeless tobacco: Never Used  Substance and Sexual Activity  . Alcohol use: No    Alcohol/week: 0.0 oz  . Drug use: No  . Sexual activity: Yes    Partners: Male  Lifestyle  . Physical activity:    Days per week: Not on file    Minutes per session: Not on file  . Stress: Not on file  Relationships  . Social connections:    Talks on phone: Not on file    Gets together: Not on file    Attends religious service: Not on file    Active member of club or organization: Not on file    Attends meetings of clubs or organizations: Not on file    Relationship status: Not on file  . Intimate partner violence:    Fear of current or ex partner: Not on file    Emotionally abused:  Not on file    Physically abused: Not on file    Forced sexual activity: Not on file  Other Topics Concern  . Not on file  Social History Narrative   1 cup caffeine/day   Right-handed.   Lives at home with husband.    Family History  Problem Relation Age of Onset  . Lung cancer Paternal Grandfather        smoker  . Heart attack Paternal Grandmother   . Heart attack Paternal Grandfather   . Heart attack Maternal Grandfather   . Hypertension Father   . Gout Father   . Depression Mother     Outpatient Encounter Medications as of 11/09/2017  Medication Sig  . Dulaglutide (TRULICITY) 0.34 VQ/2.5ZD SOPN Inject 0.75 mg into the skin every 7 (seven) days.  . DULoxetine (CYMBALTA) 60 MG capsule TAKE ONE CAPSULE BY MOUTH TWICE DAILY  . febuxostat (ULORIC) 40 MG tablet Take 40 mg by mouth daily.  . indomethacin (INDOCIN) 50 MG capsule TAKE ONE CAPSULE BY MOUTH TWICE DAILY, WITH A MEAL  . labetalol (NORMODYNE) 300 MG tablet TAKE 1 TABLET(300 MG) BY MOUTH TWICE DAILY  . levothyroxine (SYNTHROID, LEVOTHROID)  112 MCG tablet TAKE 1 TABLET(112 MCG) BY MOUTH DAILY  . lisinopril (PRINIVIL,ZESTRIL) 10 MG tablet Take 1 tablet (10 mg total) by mouth daily.  . metFORMIN (GLUCOPHAGE-XR) 500 MG 24 hr tablet Take 2 tablets (1,000 mg total) by mouth daily with breakfast.  . NIFEdipine (PROCARDIA-XL/ADALAT-CC/NIFEDICAL-XL) 30 MG 24 hr tablet TAKE 1 TABLET(30 MG) BY MOUTH DAILY  . saxagliptin HCl (ONGLYZA) 5 MG TABS tablet Take 1 tablet (5 mg total) by mouth daily.  Marland Kitchen triamcinolone cream (KENALOG) 0.1 % Apply 1 application topically daily as needed.  . AMBULATORY NON FORMULARY MEDICATION Medication Name: CPAP set to autopap from 4-18 cm water pressure.  humidifer and supplies.  Download after one week. Fax to [facility]. Dx:OSA   No facility-administered encounter medications on file as of 11/09/2017.           Objective:   Physical Exam  Constitutional: She is oriented to person, place, and time. She appears well-developed and well-nourished.  HENT:  Head: Normocephalic and atraumatic.  Eyes: Conjunctivae and EOM are normal.  Cardiovascular: Normal rate.  Pulmonary/Chest: Effort normal.  Neurological: She is alert and oriented to person, place, and time.  Skin: Skin is dry. No pallor.  Psychiatric: She has a normal mood and affect. Her behavior is normal.  Vitals reviewed.       Assessment & Plan:  OSA - new diagnosis.  Went over results with her.  We will get her set up for CPAP at home after discussing options including oral appliance etc.  We will set to AutoPap and then have a download after 1 week so that we can set her pressure and then have her follow-up in a month after that.  We also discussed the importance of sleep hygiene as it took her quite a while to fall asleep.  Interestingly there was no significant mention of periodic limb movement causing disturbance to her sleep but she says that the tech who did the sleep study told her that she was moving a lot during her sleep.  Diabetes-will look  at other options in place of the Trulicity that might have less nausea.  Unfortunately this is a side effect with all of the drugs in this class. Will try victoza. Other not on her formulary.    Infertility-did encourage her to go ahead  and stop her lisinopril.  Blood pressures overall have looked good and this is a teratogen for pregnancy.

## 2017-11-12 ENCOUNTER — Encounter: Payer: Self-pay | Admitting: Family Medicine

## 2017-11-12 MED ORDER — LIRAGLUTIDE 18 MG/3ML ~~LOC~~ SOPN: PEN_INJECTOR | SUBCUTANEOUS | 2 refills | Status: DC

## 2017-12-12 ENCOUNTER — Encounter: Payer: Self-pay | Admitting: Family Medicine

## 2017-12-14 ENCOUNTER — Telehealth: Payer: Self-pay | Admitting: *Deleted

## 2017-12-14 ENCOUNTER — Ambulatory Visit: Payer: BC Managed Care – PPO | Admitting: Family Medicine

## 2017-12-14 NOTE — Progress Notes (Deleted)
   Subjective:    Patient ID: Brenda Sharp, female    DOB: 1980/08/17, 37 y.o.   MRN: [id number]  HPI    Review of Systems     Objective:   Physical Exam        Assessment & Plan:

## 2017-12-14 NOTE — Telephone Encounter (Signed)
Spoke to pt, rescheduled CPAP appt for 01-22-18

## 2017-12-14 NOTE — Telephone Encounter (Signed)
Called pt and advised her to reschedule her appt if she has not been on her CPAP for 1 mo. She will need to reschedule today's appt .Elouise Munroe, [place]

## 2017-12-24 ENCOUNTER — Ambulatory Visit: Payer: BC Managed Care – PPO | Admitting: Family Medicine

## 2017-12-24 NOTE — Progress Notes (Deleted)
Subjective:    CC: DM  HPI:  Diabetes -Trulicity caused significant nausea so switched her to Victoza when I last saw her about 4 weeks ago.  No hypoglycemic events. No wounds or sores that are not healing well. No increased thirst or urination. Checking glucose at home. Taking medications as prescribed without any side effects.   Past medical history, Surgical history, Family history not pertinant except as noted below, Social history, Allergies, and medications have been entered into the medical record, reviewed, and corrections made.   Review of Systems: No fevers, chills, night sweats, weight loss, chest pain, or shortness of breath.   Objective:    General: Well Developed, well nourished, and in no acute distress.  Neuro: Alert and oriented x3, extra-ocular muscles intact, sensation grossly intact.  HEENT: Normocephalic, atraumatic  Skin: Warm and dry, no rashes. Cardiac: Regular rate and rhythm, no murmurs rubs or gallops, no lower extremity edema.  Respiratory: Clear to auscultation bilaterally. Not using accessory muscles, speaking in full sentences.   Impression and Recommendations:    DM -

## 2018-01-08 ENCOUNTER — Telehealth: Payer: Self-pay | Admitting: *Deleted

## 2018-01-08 NOTE — Telephone Encounter (Signed)
Called pt and lvm asking if she still wanted/needed the services. If so to contact either our office or call [place] at [phone] [facility]). To get this set up.Maryruth Eve, [place] Crocker, CMA

## 2018-01-22 ENCOUNTER — Ambulatory Visit: Payer: BC Managed Care – PPO | Admitting: Family Medicine

## 2018-01-22 ENCOUNTER — Telehealth: Payer: Self-pay | Admitting: Family Medicine

## 2018-01-22 NOTE — Progress Notes (Deleted)
   Subjective:    Patient ID: Brenda Sharp, female    DOB: February 06, 1981, 37 y.o.   MRN: [id number]  HPI 37 year old female is here today to follow-up for recent start of CPAP for relatively new diagnosis of obstructive sleep apnea.  Currently set on AutoPap.   Review of Systems     Objective:   Physical Exam  Constitutional: She is oriented to person, place, and time. She appears well-developed and well-nourished.  HENT:  Head: Normocephalic and atraumatic.  Cardiovascular: Normal rate, regular rhythm and normal heart sounds.  Pulmonary/Chest: Effort normal and breath sounds normal.  Neurological: She is alert and oriented to person, place, and time.  Skin: Skin is warm and dry.  Psychiatric: She has a normal mood and affect. Her behavior is normal.          Assessment & Plan:  Sleep apnea-

## 2018-01-22 NOTE — Telephone Encounter (Signed)
Pt stated she hasn't received "sleep machine" before today's appt. [01/22/18]. Please advise

## 2018-02-19 ENCOUNTER — Other Ambulatory Visit: Payer: Self-pay | Admitting: *Deleted

## 2018-02-19 MED ORDER — NIFEDIPINE ER OSMOTIC RELEASE 30 MG PO TB24: ORAL_TABLET | ORAL | 3 refills | Status: DC

## 2018-02-19 MED ORDER — LABETALOL HCL 300 MG PO TABS: ORAL_TABLET | ORAL | 2 refills | Status: DC

## 2018-02-19 MED ORDER — FEBUXOSTAT 40 MG PO TABS: 40.0000 mg | ORAL_TABLET | Freq: Every day | ORAL | 1 refills | Status: DC

## 2018-02-19 MED ORDER — DULOXETINE HCL 60 MG PO CPEP: 60.0000 mg | ORAL_CAPSULE | Freq: Two times a day (BID) | ORAL | 6 refills | Status: DC

## 2018-02-19 MED ORDER — LEVOTHYROXINE SODIUM 112 MCG PO TABS: ORAL_TABLET | ORAL | 3 refills | Status: DC

## 2018-02-25 ENCOUNTER — Encounter: Payer: Self-pay | Admitting: Family Medicine

## 2018-02-25 ENCOUNTER — Ambulatory Visit: Payer: BC Managed Care – PPO | Admitting: Family Medicine

## 2018-02-25 VITALS — BP 134/86 | HR 72 | Temp 98.4°F | Wt 255.0 lb

## 2018-02-25 DIAGNOSIS — E1165 Type 2 diabetes mellitus with hyperglycemia: Secondary | ICD-10-CM | POA: Diagnosis not present

## 2018-02-25 DIAGNOSIS — E038 Other specified hypothyroidism: Secondary | ICD-10-CM

## 2018-02-25 DIAGNOSIS — T887XXA Unspecified adverse effect of drug or medicament, initial encounter: Secondary | ICD-10-CM | POA: Diagnosis not present

## 2018-02-25 DIAGNOSIS — I10 Essential (primary) hypertension: Secondary | ICD-10-CM | POA: Diagnosis not present

## 2018-02-25 DIAGNOSIS — R232 Flushing: Secondary | ICD-10-CM

## 2018-02-25 LAB — COMPLETE METABOLIC PANEL WITH GFR
AG Ratio: 2 (calc) (ref 1.0–2.5)
ALBUMIN MSPROF: 4.3 g/dL (ref 3.6–5.1)
ALT: 23 U/L (ref 6–29)
AST: 20 U/L (ref 10–30)
Alkaline phosphatase (APISO): 62 U/L (ref 33–115)
BUN: 13 mg/dL (ref 7–25)
CALCIUM: 9.6 mg/dL (ref 8.6–10.2)
CO2: 27 mmol/L (ref 20–32)
CREATININE: 0.93 mg/dL (ref 0.50–1.10)
Chloride: 103 mmol/L (ref 98–110)
GFR, EST AFRICAN AMERICAN: 93 mL/min/{1.73_m2} (ref >=60)
GFR, EST NON AFRICAN AMERICAN: 80 mL/min/{1.73_m2} (ref >=60)
GLOBULIN: 2.2 g/dL (ref 1.9–3.7)
Glucose, Bld: 117 mg/dL — ABNORMAL HIGH (ref 65–99)
Potassium: 4.3 mmol/L (ref 3.5–5.3)
SODIUM: 139 mmol/L (ref 135–146)
TOTAL PROTEIN: 6.5 g/dL (ref 6.1–8.1)
Total Bilirubin: 0.4 mg/dL (ref 0.2–1.2)

## 2018-02-25 LAB — LIPID PANEL
CHOL/HDL RATIO: 6.2 (calc) — AB (ref <5.0)
Cholesterol: 237 mg/dL — ABNORMAL HIGH (ref <200)
HDL: 38 mg/dL — AB (ref >50)
LDL Cholesterol (Calc): 153 mg/dL (calc) — ABNORMAL HIGH
NON-HDL CHOLESTEROL (CALC): 199 mg/dL — AB (ref <130)
Triglycerides: 278 mg/dL — ABNORMAL HIGH (ref <150)

## 2018-02-25 LAB — POCT GLYCOSYLATED HEMOGLOBIN (HGB A1C): Hemoglobin A1C: 6.1 % — AB (ref 4.0–5.6)

## 2018-02-25 LAB — TSH: TSH: 3.04 mIU/L

## 2018-02-25 NOTE — Progress Notes (Signed)
Subjective:    CC: DM, HTN   HPI:  Diabetes - no hypoglycemic events. No wounds or sores that are not healing well. No increased thirst or urination. Checking glucose at home. Taking medications as prescribed without any side effects.  Unfortunately, the Victoza caused nausea.  We will add to intolerance list.  Hypertension- Pt denies chest pain, SOB, dizziness, or heart palpitations.  Taking meds as directed w/o problems.  Denies medication side effects.    Hypothyroidism - Taking medication regularly in the AM away from food and vitamins, etc. No recent change to skin, hair, or energy levels.  She has been having some hot flashes.  She is not sure if it is related to her thyroid or if it is the Femara that she is taking for fertility. Lab Results  Component Value Date   TSH 3.25 06/05/2017      Past medical history, Surgical history, Family history not pertinant except as noted below, Social history, Allergies, and medications have been entered into the medical record, reviewed, and corrections made.   Review of Systems: No fevers, chills, night sweats, weight loss, chest pain, or shortness of breath.   Objective:    General: Well Developed, well nourished, and in no acute distress.  Neuro: Alert and oriented x3, extra-ocular muscles intact, sensation grossly intact.  HEENT: Normocephalic, atraumatic  Skin: Warm and dry, no rashes. Cardiac: Regular rate and rhythm, no murmurs rubs or gallops, no lower extremity edema.  Respiratory: Clear to auscultation bilaterally. Not using accessory muscles, speaking in full sentences.   Impression and Recommendations:    DM -well controlled.  In fact hemoglobin A1c looks fantastic today at 6.1.  Continue current regimen.  Follow-up in 4 months.  Great work continue to work on [employer] and regular exercise.  Continue with metformin and Onglyza. HTN - Well controlled. Continue current regimen. Follow up in  3-4 months. Due for CMP and  lipids.     Hypothyroidism-due to recheck TSH.  Flashes-likely medication side effect but we will check her thyroid to make sure that it does not need to be adjusted.  Medication intolerance-added Victoza to intolerance list secondary to nausea.

## 2018-02-25 NOTE — Patient Instructions (Signed)
Please remember to schedule your eye exam. 

## 2018-02-28 ENCOUNTER — Telehealth: Payer: Self-pay | Admitting: *Deleted

## 2018-02-28 NOTE — Telephone Encounter (Signed)
Called pt and lvm informing her that [facility] did not have results of a pap smear for her and that she will need to get this done. She can either have this done here or if she has a GYN she can go there and get this done.Maryruth Eve, [place] Crocker, CMA

## 2018-03-28 ENCOUNTER — Other Ambulatory Visit: Payer: Self-pay | Admitting: *Deleted

## 2018-03-28 DIAGNOSIS — E1165 Type 2 diabetes mellitus with hyperglycemia: Secondary | ICD-10-CM

## 2018-03-28 MED ORDER — METFORMIN HCL ER 500 MG PO TB24: 1000.0000 mg | ORAL_TABLET | Freq: Every day | ORAL | 3 refills | Status: DC

## 2018-04-22 LAB — HM DIABETES EYE EXAM

## 2018-05-02 ENCOUNTER — Other Ambulatory Visit: Payer: Self-pay | Admitting: Family Medicine

## 2018-06-29 ENCOUNTER — Other Ambulatory Visit: Payer: Self-pay | Admitting: Family Medicine

## 2018-07-03 ENCOUNTER — Ambulatory Visit: Payer: BC Managed Care – PPO | Admitting: Family Medicine

## 2018-07-03 ENCOUNTER — Encounter: Payer: Self-pay | Admitting: Family Medicine

## 2018-07-03 VITALS — BP 140/98 | HR 93 | Ht 63.0 in | Wt 250.0 lb

## 2018-07-03 DIAGNOSIS — I10 Essential (primary) hypertension: Secondary | ICD-10-CM

## 2018-07-03 DIAGNOSIS — E1169 Type 2 diabetes mellitus with other specified complication: Secondary | ICD-10-CM

## 2018-07-03 DIAGNOSIS — K921 Melena: Secondary | ICD-10-CM

## 2018-07-03 DIAGNOSIS — Z23 Encounter for immunization: Secondary | ICD-10-CM | POA: Diagnosis not present

## 2018-07-03 DIAGNOSIS — R0789 Other chest pain: Secondary | ICD-10-CM | POA: Diagnosis not present

## 2018-07-03 DIAGNOSIS — R000 Tachycardia, unspecified: Secondary | ICD-10-CM

## 2018-07-03 DIAGNOSIS — R1033 Periumbilical pain: Secondary | ICD-10-CM

## 2018-07-03 LAB — POCT GLYCOSYLATED HEMOGLOBIN (HGB A1C): HEMOGLOBIN A1C: 6.3 % — AB (ref 4.0–5.6)

## 2018-07-03 LAB — POCT UA - MICROALBUMIN
Albumin/Creatinine Ratio, Urine, POC: <30
Creatinine, POC: 200 mg/dL
Microalbumin Ur, POC: 30 mg/L

## 2018-07-03 NOTE — Progress Notes (Addendum)
Subjective:    CC: DM, HTN  HPI:  Diabetes - no hypoglycemic events. No wounds or sores that are not healing well. No increased thirst or urination. Checking glucose at home. Taking medications as prescribed without any side effects.  Hypertension- Pt denies chest pain, SOB, dizziness, or heart palpitations.  Taking meds as directed w/o problems.  Denies medication side effects.    For the last couple fo weeks she has been experiencing a rapid heart rate and dizziness. No visual change or SOB.  Last about 15 min when it happens.  She says it happened about 4 times in the last week.  She also get a heaviness in her chest when it happens.  She is never had this happen before.  She denies any heavy periods.  She also reports for the last year that she is been having bloody bowel movements.  She is been getting abdominal pain around the umbilicus and then having some bright red blood with her stools and sometimes she will even have bright red blood even without a bowel movement.  She says it almost feels like gas pains in her abdomen and it will go away but it keeps recurring.  She denies any new medications or major dietary changes.  She has never seen gastroneurology before.  BP (!) 140/98   Pulse 93   Ht 5\' 3"  (1.6 m)   Wt 250 lb (113.4 kg)   SpO2 99%   BMI 44.29 kg/m     Allergies  Allergen Reactions  . Imitrex [Sumatriptan]     Chest pain  . Januvia [Sitagliptin] Other (See Comments)    Nausea, GI upset  . Penicillins     Other reaction(s): Other migraines  . Victoza [Liraglutide] Nausea Only  . Colchicine Nausea Only  . Trulicity [Dulaglutide] Nausea Only    Past Medical History:  Diagnosis Date  . Gout   . Headache(784.0)   . Hypertension   . Hypothyroidism   . Kidney stone   . Miscarriage   . PCOS (polycystic ovarian syndrome)   . Torticollis     Past Surgical History:  Procedure Laterality Date  . LITHOTRIPSY    . TONSILLECTOMY  1990  . WISDOM TOOTH EXTRACTION   2006    Social History   Socioeconomic History  . Marital status: Married    Spouse name: Ed  . Number of children: 0  . Years of education: college  . Highest education level: Not on file  Occupational History  . 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 Financial resource strain: Not on file  . Food insecurity:    Worry: Not on file    Inability: Not on file  . Transportation needs:    Medical: Not on file    Non-medical: Not on file  Tobacco Use  . Smoking status: Never Smoker  . Smokeless tobacco: Never Used  Substance and Sexual Activity  . Alcohol use: No    Alcohol/week: 0.0 standard drinks  . Drug use: No  . Sexual activity: Yes    Partners: Male  Lifestyle  . Physical activity:    Days per week: Not on file    Minutes per session: Not on file  . Stress: Not on file  Relationships  . Social connections:    Talks on phone: Not on file    Gets together: Not on file    Attends religious service: Not on file    Active member of  club or organization: Not on file    Attends meetings of clubs or organizations: Not on file    Relationship status: Not on file  . Intimate partner violence:    Fear of current or ex partner: Not on file    Emotionally abused: Not on file    Physically abused: Not on file    Forced sexual activity: Not on file  Other Topics Concern  . Not on file  Social History Narrative   1 cup caffeine/day   Right-handed.   Lives at home with husband.    Family History  Problem Relation Age of Onset  . Lung cancer Paternal Grandfather        smoker  . Heart attack Paternal Grandmother   . Heart attack Paternal Grandfather   . Heart attack Maternal Grandfather   . Hypertension Father   . Gout Father   . Depression Mother     Outpatient Encounter Medications as of 07/03/2018  Medication Sig  . DULoxetine (CYMBALTA) 60 MG capsule Take 1 capsule (60 mg total) by mouth 2 (two) times daily.  . febuxostat (ULORIC) 40 MG  tablet Take 1 tablet (40 mg total) by mouth daily.  . indomethacin (INDOCIN) 50 MG capsule TAKE ONE CAPSULE BY MOUTH TWICE DAILY, WITH A MEAL  . labetalol (NORMODYNE) 300 MG tablet TAKE 1 TABLET(300 MG) BY MOUTH TWICE DAILY  . levothyroxine (SYNTHROID, LEVOTHROID) 112 MCG tablet TAKE 1 TABLET(112 MCG) BY MOUTH DAILY  . metFORMIN (GLUCOPHAGE-XR) 500 MG 24 hr tablet Take 2 tablets (1,000 mg total) by mouth daily with breakfast.  . NIFEdipine (PROCARDIA-XL/ADALAT-CC/NIFEDICAL-XL) 30 MG 24 hr tablet TAKE 1 TABLET(30 MG) BY MOUTH DAILY  . saxagliptin HCl (ONGLYZA) 5 MG TABS tablet Take 1 tablet (5 mg total) by mouth daily.  . [DISCONTINUED] AMBULATORY NON FORMULARY MEDICATION Medication Name: CPAP set to autopap from 4-18 cm water pressure.  humidifer and supplies.  Download after one week. Fax to [facility]. Dx:OSA  . [DISCONTINUED] liraglutide (VICTOZA) 18 MG/3ML SOPN Inject 0.1 mLs (0.6 mg total) into the skin daily for 14 days, THEN 0.2 mLs (1.2 mg total) daily for 14 days.  . [DISCONTINUED] OVIDREL 250 MCG/0.5ML injection   . [DISCONTINUED] triamcinolone cream (KENALOG) 0.1 % Apply 1 application topically daily as needed.   No facility-administered encounter medications on file as of 07/03/2018.        Objective:    General: Well Developed, well nourished, and in no acute distress.  Neuro: Alert and oriented x3, extra-ocular muscles intact, sensation grossly intact.  HEENT: Normocephalic, atraumatic, no cervical LN.    Skin: Warm and dry, no rashes. Cardiac: Regular rate and rhythm, no murmurs rubs or gallops, no lower extremity edema. Radial pulses 2+ bilaterally.  No carotid bruits.  NO LE edema.   Respiratory: Clear to auscultation bilaterally. Not using accessory muscles, speaking in full sentences.   Impression and Recommendations:    DM - Well controlled. Continue current regimen. Follow up in 4 months.    HTN - Uncontrolled. F/U nurse visit in 1-2 weeks to recheck.    Chest  pain/Palpitations/dizziness - Unclear etiology. Will work up further evaluating for thyroid disorder, like to like disturbance, and anemia.  Will call with results once available.  We will also go ahead and get her referred for cardiac monitor to see if we can capture the episodes that she is experiencing to evaluate for PVCs or PACs versus arrhythmia.  KG today shows rate of 78 bpm, normal sinus rhythm  with no acute ST-T wave changes.  Blood in BM/abdominal pain.  We discussed options.  At this point this is been an ongoing problem for almost a year.  I think it is important that we refer her to GI and she has full comprehensive work-up for the symptoms.  Explained that it could be an indication of something like Crohn's disease or ulcerative colitis versus hemorrhoids.

## 2018-07-03 NOTE — Patient Instructions (Signed)
Pt advised to f/u in 2 wks for NV for blood pressure check.Brenda Sharp, [place]

## 2018-07-04 LAB — COMPLETE METABOLIC PANEL WITH GFR
AG RATIO: 1.9 (calc) (ref 1.0–2.5)
ALBUMIN MSPROF: 4.2 g/dL (ref 3.6–5.1)
ALT: 27 U/L (ref 6–29)
AST: 24 U/L (ref 10–30)
Alkaline phosphatase (APISO): 62 U/L (ref 33–115)
BILIRUBIN TOTAL: 0.5 mg/dL (ref 0.2–1.2)
BUN: 12 mg/dL (ref 7–25)
CALCIUM: 9.7 mg/dL (ref 8.6–10.2)
CHLORIDE: 107 mmol/L (ref 98–110)
CO2: 27 mmol/L (ref 20–32)
Creat: 0.91 mg/dL (ref 0.50–1.10)
GFR, EST AFRICAN AMERICAN: 95 mL/min/{1.73_m2} (ref >=60)
GFR, EST NON AFRICAN AMERICAN: 82 mL/min/{1.73_m2} (ref >=60)
Globulin: 2.2 g/dL (calc) (ref 1.9–3.7)
Glucose, Bld: 121 mg/dL — ABNORMAL HIGH (ref 65–99)
POTASSIUM: 4.6 mmol/L (ref 3.5–5.3)
Sodium: 143 mmol/L (ref 135–146)
TOTAL PROTEIN: 6.4 g/dL (ref 6.1–8.1)

## 2018-07-04 LAB — CBC
HEMATOCRIT: 42.2 % (ref 35.0–45.0)
HEMOGLOBIN: 14.3 g/dL (ref 11.7–15.5)
MCH: 30.2 pg (ref 27.0–33.0)
MCHC: 33.9 g/dL (ref 32.0–36.0)
MCV: 89 fL (ref 80.0–100.0)
MPV: 9.5 fL (ref 7.5–12.5)
Platelets: 377 10*3/uL (ref 140–400)
RBC: 4.74 10*6/uL (ref 3.80–5.10)
RDW: 13.1 % (ref 11.0–15.0)
WBC: 7 10*3/uL (ref 3.8–10.8)

## 2018-07-04 LAB — TSH: TSH: 2.57 m[IU]/L

## 2018-07-17 ENCOUNTER — Ambulatory Visit: Payer: BC Managed Care – PPO

## 2018-07-23 ENCOUNTER — Encounter: Payer: Self-pay | Admitting: Gastroenterology

## 2018-07-23 ENCOUNTER — Encounter: Payer: Self-pay | Admitting: Internal Medicine

## 2018-07-29 ENCOUNTER — Encounter: Payer: Self-pay | Admitting: Cardiology

## 2018-07-29 ENCOUNTER — Encounter: Payer: Self-pay | Admitting: *Deleted

## 2018-07-29 ENCOUNTER — Ambulatory Visit: Payer: BC Managed Care – PPO | Admitting: Cardiology

## 2018-07-29 VITALS — BP 118/68 | HR 102 | Ht 63.0 in | Wt 251.0 lb

## 2018-07-29 DIAGNOSIS — E088 Diabetes mellitus due to underlying condition with unspecified complications: Secondary | ICD-10-CM | POA: Diagnosis not present

## 2018-07-29 DIAGNOSIS — E782 Mixed hyperlipidemia: Secondary | ICD-10-CM

## 2018-07-29 DIAGNOSIS — Z6841 Body Mass Index (BMI) 40.0 and over, adult: Secondary | ICD-10-CM

## 2018-07-29 DIAGNOSIS — R0789 Other chest pain: Secondary | ICD-10-CM

## 2018-07-29 DIAGNOSIS — I10 Essential (primary) hypertension: Secondary | ICD-10-CM

## 2018-07-29 DIAGNOSIS — Z01812 Encounter for preprocedural laboratory examination: Secondary | ICD-10-CM

## 2018-07-29 DIAGNOSIS — R079 Chest pain, unspecified: Secondary | ICD-10-CM

## 2018-07-29 MED ORDER — NITROGLYCERIN 0.4 MG SL SUBL: 0.4000 mg | SUBLINGUAL_TABLET | SUBLINGUAL | 6 refills | Status: DC | PRN

## 2018-07-29 NOTE — Progress Notes (Signed)
Cardiology Office Note:    Date:  07/29/2018   ID:  Brenda Sharp, DOB 05/19/1981, MRN [id number]  PCP:  Hali Marry, MD  Cardiologist:  Jenean Lindau, MD   Referring MD: Hali Marry, *    ASSESSMENT:    1. Chest discomfort   2. Essential hypertension, benign   3. Morbid obesity with body mass index of 40.0-44.9 in adult ([place])   4. Diabetes mellitus due to underlying condition with unspecified complications ([place])   5. Mixed dyslipidemia    PLAN:    In order of problems listed above:  1. Primary prevention stressed with the patient.  Importance of compliance with diet and medication stressed and she vocalized understanding.  Her blood pressure is stable.  Diet was discussed for dyslipidemia and diabetes mellitus.  Importance of regular exercise stressed. 2. In view of diabetes mellitus or even pre-diabetic status I told her to and this was outlined in respect to diabetes mellitus and dyslipidemia and obesity and she vocalized understanding.  I detailed with her about her lipid abnormalities and how diet can help it.  If this does not work she will need to be on statin therapy and liver to her primary care physician to address this issue as she will be following up with the patient on a long-term basis. 3. Patient has significant chest discomfort issues and I would like to evaluate her in view of her risk factors and recommended exercise stress Cardiolite and she is agreeable.  We will make sure that we will do a urine pregnancy test before this.  Patient mentions to me that there is no chance of her being pregnant at this time but she is sexually active. 4. Echocardiogram will be done to assess murmur heard on auscultation. 5. Nitroglycerin prescription was sent and protocol was explained to the patient.  She knows to go to the nearest emergency room for any concerning symptoms.   Medication Adjustments/Labs and Tests Ordered: Current medicines are reviewed at  length with the patient today.  Concerns regarding medicines are outlined above.  No orders of the defined types were placed in this encounter.  No orders of the defined types were placed in this encounter.    History of Present Illness:    Brenda Sharp is a 37 y.o. female who is being seen today for the evaluation of chest discomfort at the request of Hali Marry, *.  Patient is a pleasant 37 year old female.  She has past medical history of essential hypertension, diabetes mellitus, dyslipidemia and morbid obesity.  She leads a sedentary lifestyle.  She mentions to me that she has chest discomfort at times.  This is not particularly related to exertion.  No history of radiation to the neck or to the arms.  Again she leads a very sedentary lifestyle so asked her whether these symptoms came on with sexual activity and she answers in the negative.  At the time of my evaluation she is alert awake oriented and in no distress.  Past Medical History:  Diagnosis Date  . Gout   . Headache(784.0)   . Hypertension   . Hypothyroidism   . Kidney stone   . Miscarriage   . PCOS (polycystic ovarian syndrome)   . Torticollis     Past Surgical History:  Procedure Laterality Date  . LITHOTRIPSY    . TONSILLECTOMY  1990  . WISDOM TOOTH EXTRACTION  2006    Current Medications: Current Meds  Medication Sig  .  DULoxetine (CYMBALTA) 60 MG capsule Take 1 capsule (60 mg total) by mouth 2 (two) times daily.  . febuxostat (ULORIC) 40 MG tablet Take 1 tablet (40 mg total) by mouth daily.  . indomethacin (INDOCIN) 50 MG capsule TAKE ONE CAPSULE BY MOUTH TWICE DAILY, WITH A MEAL  . labetalol (NORMODYNE) 300 MG tablet TAKE 1 TABLET(300 MG) BY MOUTH TWICE DAILY  . levothyroxine (SYNTHROID, LEVOTHROID) 112 MCG tablet TAKE 1 TABLET(112 MCG) BY MOUTH DAILY  . metFORMIN (GLUCOPHAGE-XR) 500 MG 24 hr tablet Take 2 tablets (1,000 mg total) by mouth daily with breakfast.  . NIFEdipine  (PROCARDIA-XL/ADALAT-CC/NIFEDICAL-XL) 30 MG 24 hr tablet TAKE 1 TABLET(30 MG) BY MOUTH DAILY  . saxagliptin HCl (ONGLYZA) 5 MG TABS tablet Take 1 tablet (5 mg total) by mouth daily.     Allergies:   Imitrex [sumatriptan]; Januvia [sitagliptin]; Penicillins; Victoza [liraglutide]; Colchicine; and Trulicity [dulaglutide]   Social History   Socioeconomic History  . Marital status: Married    Spouse name: Ed  . Number of children: 0  . Years of education: college  . Highest education level: Not on file  Occupational History  . 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 Financial resource strain: Not on file  . Food insecurity:    Worry: Not on file    Inability: Not on file  . Transportation needs:    Medical: Not on file    Non-medical: Not on file  Tobacco Use  . Smoking status: Never Smoker  . Smokeless tobacco: Never Used  Substance and Sexual Activity  . Alcohol use: No    Alcohol/week: 0.0 standard drinks  . Drug use: No  . Sexual activity: Yes    Partners: Male  Lifestyle  . Physical activity:    Days per week: Not on file    Minutes per session: Not on file  . Stress: Not on file  Relationships  . Social connections:    Talks on phone: Not on file    Gets together: Not on file    Attends religious service: Not on file    Active member of club or organization: Not on file    Attends meetings of clubs or organizations: Not on file    Relationship status: Not on file  Other Topics Concern  . Not on file  Social History Narrative   1 cup caffeine/day   Right-handed.   Lives at home with husband.     Family History: The patient's family history includes Depression in her mother; Gout in her father; Heart attack in her maternal grandfather, paternal grandfather, and paternal grandmother; Hypertension in her father; Lung cancer in her paternal grandfather.  ROS:   Please see the history of present illness.    All other systems reviewed and are  negative.  EKGs/Labs/Other Studies Reviewed:    The following studies were reviewed today: EKG reveals sinus rhythm and nonspecific ST-T changes   Recent Labs: 07/03/2018: ALT 27; BUN 12; Creat 0.91; Hemoglobin 14.3; Platelets 377; Potassium 4.6; Sodium 143; TSH 2.57  Recent Lipid Panel    Component Value Date/Time   CHOL 237 (H) 02/25/2018 0932   TRIG 278 (H) 02/25/2018 0932   HDL 38 (L) 02/25/2018 0932   CHOLHDL 6.2 (H) 02/25/2018 0932   VLDL 54 (H) 07/22/2012 1622   LDLCALC 153 (H) 02/25/2018 0932    Physical Exam:    VS:  BP 118/68 (BP Location: Right Arm, Patient Position: Sitting, Cuff Size: Normal)  Pulse (!) 102   Ht 5\' 3"  (1.6 m)   Wt 251 lb (113.9 kg)   SpO2 98%   BMI 44.46 kg/m     Wt Readings from Last 3 Encounters:  07/29/18 251 lb (113.9 kg)  07/03/18 250 lb (113.4 kg)  02/25/18 255 lb (115.7 kg)     GEN: Patient is in no acute distress HEENT: Normal NECK: No JVD; No carotid bruits LYMPHATICS: No lymphadenopathy CARDIAC: S1 S2 regular, 2/6 systolic murmur at the apex. RESPIRATORY:  Clear to auscultation without rales, wheezing or rhonchi  ABDOMEN: Soft, non-tender, non-distended MUSCULOSKELETAL:  No edema; No deformity  SKIN: Warm and dry NEUROLOGIC:  Alert and oriented x 3 PSYCHIATRIC:  Normal affect    Signed, Jenean Lindau, MD  07/29/2018 1:54 PM    [facility]

## 2018-07-29 NOTE — Patient Instructions (Signed)
Medication Instructions:  Your physician has recommended you make the following change in your medication:   START nitroglycerin as needed for chest pain: When having chest pain, stop what you are doing and sit down. Take 1 nitro, wait 5 minutes. Still having chest pain, take 1 nitro, wait 5 minutes. Still having chest pain, take 1 nitro, dial 911. Total of 3 nitro in 15 minutes.   If you need a refill on your cardiac medications before your next appointment, please call your pharmacy.   Lab work: Your physician recommends that you return for lab work today: pregnancy urine test.   If you have labs (blood work) drawn today and your tests are completely normal, you will receive your results only by: [facility] MyChart Message (if you have MyChart) OR . A paper copy in the mail If you have any lab test that is abnormal or we need to change your treatment, we will call you to review the results.  Testing/Procedures: Your physician has requested that you have an echocardiogram. Echocardiography is a painless test that uses sound waves to create images of your heart. It provides your doctor with information about the size and shape of your heart and how well your heart's chambers and valves are working. This procedure takes approximately one hour. There are no restrictions for this procedure.  Your physician has requested that you have en exercise stress myoview. For further information please visit [email or web address]. Please follow instruction sheet, as given.  Follow-Up: At [facility], you and your health needs are our priority.  As part of our continuing mission to provide you with exceptional heart care, we have created designated Provider Care Teams.  These Care Teams include your primary Cardiologist (physician) and Advanced Practice Providers (APPs -  Physician Assistants and Nurse Practitioners) who all work together to provide you with the care you need, when you need it. You will need a  follow up appointment in 6 months.  Please call our office 2 months in advance to schedule this appointment.      Echocardiogram An echocardiogram is a procedure that uses painless sound waves (ultrasound) to produce an image of the heart. Images from an echocardiogram can provide important information about:  Signs of coronary artery disease (CAD).  Aneurysm detection. An aneurysm is a weak or damaged part of an artery wall that bulges out from the normal force of blood pumping through the body.  Heart size and shape. Changes in the size or shape of the heart can be associated with certain conditions, including heart failure, aneurysm, and CAD.  Heart muscle function.  Heart valve function.  Signs of a past heart attack.  Fluid buildup around the heart.  Thickening of the heart muscle.  A tumor or infectious growth around the heart valves. Tell a health care provider about:  Any allergies you have.  All medicines you are taking, including vitamins, herbs, eye drops, creams, and over-the-counter medicines.  Any blood disorders you have.  Any surgeries you have had.  Any medical conditions you have.  Whether you are pregnant or may be pregnant. What are the risks? Generally, this is a safe procedure. However, problems may occur, including:  Allergic reaction to dye (contrast) that may be used during the procedure. What happens before the procedure? No specific preparation is needed. You may eat and drink normally. What happens during the procedure?   An IV tube may be inserted into one of your veins.  You may receive contrast through  this tube. A contrast is an injection that improves the quality of the pictures from your heart.  A gel will be applied to your chest.  A wand-like tool (transducer) will be moved over your chest. The gel will help to transmit the sound waves from the transducer.  The sound waves will harmlessly bounce off of your heart to allow the  heart images to be captured in real-time motion. The images will be recorded on a computer. The procedure may vary among health care providers and hospitals. What happens after the procedure?  You may return to your normal, everyday life, including diet, activities, and medicines, unless your health care provider tells you not to do that. Summary  An echocardiogram is a procedure that uses painless sound waves (ultrasound) to produce an image of the heart.  Images from an echocardiogram can provide important information about the size and shape of your heart, heart muscle function, heart valve function, and fluid buildup around your heart.  You do not need to do anything to prepare before this procedure. You may eat and drink normally.  After the echocardiogram is completed, you may return to your normal, everyday life, unless your health care provider tells you not to do that. This information is not intended to replace advice given to you by your health care provider. Make sure you discuss any questions you have with your health care provider. Document Released: 07/21/2000 Document Revised: 08/26/2016 Document Reviewed: 08/26/2016 Elsevier Interactive Patient Education  2019 [employer].     Exercise Stress Test An exercise stress test is a test to check how your heart works during exercise. You will need to walk on a treadmill or ride an exercise bike for this test. An electrocardiogram (ECG) will record your heartbeat when you are at rest and when you are exercising. You may have an ultrasound or nuclear test after the exercise test. The test is done to check for coronary artery disease (CAD). It is also done to:  See how well you can exercise.  Watch for high blood pressure during exercise.  Test how well you can exercise after treatment.  Check the blood flow to your arms and legs. If your test result is not normal, more testing may be needed. What happens before the  procedure?  Follow instructions from your doctor about what you cannot eat or drink. ? Do not have any drinks or foods that have caffeine in them for 24 hours before the test, or as told by your doctor. This includes coffee, tea (even decaf tea), sodas, chocolate, and cocoa.  Ask your doctor about changing or stopping your normal medicines. This is important if you: ? Take diabetes medicines. ? Take beta-blocker medicines. ? Wear a nitroglycerin patch.  If you use an inhaler, bring it with you to the test.  Do not put lotions, powders, creams, or oils on your chest before the test.  Wear comfortable shoes and clothing.  Do not use any products that have nicotine or tobacco in them, such as cigarettes and e-cigarettes. Stop using them at least 4 hours before the test. If you need help quitting, ask your doctor. What happens during the procedure?  Patches (electrodes) will be put on your chest.  Wires will be connected to the patches. The wires will send signals to a machine to record your heartbeat.  Your heart rate will be watched while you are resting and while you are exercising. Your blood pressure will also be watched during  the test.  You will walk on a treadmill or use a stationary bike. If you cannot use these, you may be asked to turn a crank with your hands.  The activity will get harder and will raise your heart rate.  You may be asked to breathe into a tube a few times during the test. This measures the gases that you breathe out.  You will be asked how you are feeling throughout the test.  You will exercise until your heart reaches a target heart rate. You will stop early if: ? You feel dizzy. ? You have chest pain. ? You are out of breath. ? Your blood pressure is too high or too low. ? You have an irregular heartbeat. ? You have pain or aching in your arms or legs. The procedure may vary among doctors and hospitals. What happens after the procedure?  Your  blood pressure, heart rate, breathing rate, and blood oxygen level will be watched after the test.  You may return to your normal diet and activities as told by your doctor.  It is up to you to get the results of your test. Ask your doctor, or the department that is doing the test, when your results will be ready. Summary  An exercise stress test is a test to check how your heart works during exercise.  This test is done to check for coronary artery disease.  Your heart rate will be watched while you are resting and while you are exercising.  Follow instructions from your doctor about what you cannot eat or drink before the test. This information is not intended to replace advice given to you by your health care provider. Make sure you discuss any questions you have with your health care provider. Document Released: 01/10/2008 Document Revised: 10/24/2016 Document Reviewed: 10/24/2016 Elsevier Interactive Patient Education  2019 Elsevier Inc.    Nitroglycerin sublingual tablets What is this medicine? NITROGLYCERIN (nye troe GLI ser in) is a type of vasodilator. It relaxes blood vessels, increasing the blood and oxygen supply to your heart. This medicine is used to relieve chest pain caused by angina. It is also used to prevent chest pain before activities like climbing stairs, going outdoors in cold weather, or sexual activity. This medicine may be used for other purposes; ask your health care provider or pharmacist if you have questions. COMMON BRAND NAME(S): Nitroquick, Nitrostat, Nitrotab What should I tell my health care provider before I take this medicine? They need to know if you have any of these conditions: -anemia -head injury, recent stroke, or bleeding in the brain -liver disease -previous heart attack -an unusual or allergic reaction to nitroglycerin, other medicines, foods, dyes, or preservatives -pregnant or trying to get pregnant -breast-feeding How should I use  this medicine? Take this medicine by mouth as needed. At the first sign of an angina attack (chest pain or tightness) place one tablet under your tongue. You can also take this medicine 5 to 10 minutes before an event likely to produce chest pain. Follow the directions on the prescription label. Let the tablet dissolve under the tongue. Do not swallow whole. Replace the dose if you accidentally swallow it. It will help if your mouth is not dry. Saliva around the tablet will help it to dissolve more quickly. Do not eat or drink, smoke or chew tobacco while a tablet is dissolving. If you are not better within 5 minutes after taking ONE dose of nitroglycerin, call 9-1-1 immediately to seek emergency  medical care. Do not take more than 3 nitroglycerin tablets over 15 minutes. If you take this medicine often to relieve symptoms of angina, your doctor or health care professional may provide you with different instructions to manage your symptoms. If symptoms do not go away after following these instructions, it is important to call 9-1-1 immediately. Do not take more than 3 nitroglycerin tablets over 15 minutes. Talk to your pediatrician regarding the use of this medicine in children. Special care may be needed. Overdosage: If you think you have taken too much of this medicine contact a poison control center or emergency room at once. NOTE: This medicine is only for you. Do not share this medicine with others. What if I miss a dose? This does not apply. This medicine is only used as needed. What may interact with this medicine? Do not take this medicine with any of the following medications: -certain migraine medicines like ergotamine and dihydroergotamine (DHE) -medicines used to treat erectile dysfunction like sildenafil, tadalafil, and vardenafil -riociguat This medicine may also interact with the following medications: -alteplase -aspirin -heparin -medicines for high blood pressure -medicines for  mental depression -other medicines used to treat angina -phenothiazines like chlorpromazine, mesoridazine, prochlorperazine, thioridazine This list may not describe all possible interactions. Give your health care provider a list of all the medicines, herbs, non-prescription drugs, or dietary supplements you use. Also tell them if you smoke, drink alcohol, or use illegal drugs. Some items may interact with your medicine. What should I watch for while using this medicine? Tell your doctor or health care professional if you feel your medicine is no longer working. Keep this medicine with you at all times. Sit or lie down when you take your medicine to prevent falling if you feel dizzy or faint after using it. Try to remain calm. This will help you to feel better faster. If you feel dizzy, take several deep breaths and lie down with your feet propped up, or bend forward with your head resting between your knees. You may get drowsy or dizzy. Do not drive, use machinery, or do anything that needs mental alertness until you know how this drug affects you. Do not stand or sit up quickly, especially if you are an older patient. This reduces the risk of dizzy or fainting spells. Alcohol can make you more drowsy and dizzy. Avoid alcoholic drinks. Do not treat yourself for coughs, colds, or pain while you are taking this medicine without asking your doctor or health care professional for advice. Some ingredients may increase your blood pressure. What side effects may I notice from receiving this medicine? Side effects that you should report to your doctor or health care professional as soon as possible: -blurred vision -dry mouth -skin rash -sweating -the feeling of extreme pressure in the head -unusually weak or tired Side effects that usually do not require medical attention (report to your doctor or health care professional if they continue or are bothersome): -flushing of the face or  neck -headache -irregular heartbeat, palpitations -nausea, vomiting This list may not describe all possible side effects. Call your doctor for medical advice about side effects. You may report side effects to FDA at 1-800-FDA-1088. Where should I keep my medicine? Keep out of the reach of children. Store at room temperature between 20 and 25 degrees C (68 and 77 degrees F). Store in [facility] [employer]. Protect from light and moisture. Keep tightly closed. Throw away any unused medicine after the expiration date. NOTE: This sheet  is a summary. It may not cover all possible information. If you have questions about this medicine, talk to your doctor, pharmacist, or health care provider.  2019 Elsevier/Gold Standard (2013-05-22 17:57:36)

## 2018-07-30 ENCOUNTER — Telehealth: Payer: Self-pay

## 2018-07-30 LAB — PREGNANCY, URINE: Preg Test, Ur: NEGATIVE

## 2018-07-30 NOTE — Telephone Encounter (Signed)
Called and left detailed voice message on patients phone regarding lab results. 

## 2018-07-30 NOTE — Telephone Encounter (Signed)
-----   Message from Jenean Lindau, MD sent at 07/30/2018 12:12 PM EST ----- The results of the study is unremarkable. Please inform patient. I will discuss in detail at next appointment. [facility]  primary care/referring physician Jenean Lindau, MD 07/30/2018 12:12 PM

## 2018-08-01 ENCOUNTER — Other Ambulatory Visit (INDEPENDENT_AMBULATORY_CARE_PROVIDER_SITE_OTHER): Payer: BC Managed Care – PPO

## 2018-08-01 ENCOUNTER — Encounter: Payer: Self-pay | Admitting: Gastroenterology

## 2018-08-01 ENCOUNTER — Ambulatory Visit: Payer: BC Managed Care – PPO | Admitting: Gastroenterology

## 2018-08-01 ENCOUNTER — Other Ambulatory Visit: Payer: Self-pay | Admitting: Family Medicine

## 2018-08-01 VITALS — BP 132/84 | HR 102 | Ht 63.0 in | Wt 254.1 lb

## 2018-08-01 DIAGNOSIS — K625 Hemorrhage of anus and rectum: Secondary | ICD-10-CM

## 2018-08-01 DIAGNOSIS — K589 Irritable bowel syndrome without diarrhea: Secondary | ICD-10-CM

## 2018-08-01 DIAGNOSIS — R1032 Left lower quadrant pain: Secondary | ICD-10-CM

## 2018-08-01 NOTE — Patient Instructions (Addendum)
If you are age 35 or older, your body mass index should be between 23-30. Your Body mass index is 45.02 kg/m. If this is out of the aforementioned range listed, please consider follow up with your Primary Care Provider.  If you are age 6 or younger, your body mass index should be between 19-25. Your Body mass index is 45.02 kg/m. If this is out of the aformentioned range listed, please consider follow up with your Primary Care Provider.   You have been scheduled for a CT scan of the abdomen and pelvis at  as [place] are scheduled on 08/08/18 at [place] should arrive 15 minutes prior to your appointment time for registration. Please follow the written instructions below on the day of your exam:  WARNING: IF YOU ARE ALLERGIC TO IODINE/X-RAY DYE, PLEASE NOTIFY RADIOLOGY IMMEDIATELY AT [phone]! YOU WILL BE GIVEN A 13 HOUR PREMEDICATION PREP.  1) Do not eat or drink anything after 6am (4 hours prior to your test) 2) You have been given 2 bottles of oral contrast to drink. The solution may taste better if refrigerated, but do NOT add ice or any other liquid to this solution. Shake well before drinking.    Drink 1 bottle of contrast @ 8am (2 hours prior to your exam)  Drink 1 bottle of contrast @ 9am (1 hour prior to your exam)  You may take any medications as prescribed with a small amount of water, if necessary. If you take any of the following medications: METFORMIN, GLUCOPHAGE, GLUCOVANCE, AVANDAMET, RIOMET, FORTAMET, [place] MET, JANUMET, GLUMETZA or METAGLIP, you MAY be asked to HOLD this medication 48 hours AFTER the exam.  The purpose of you drinking the oral contrast is to aid in the visualization of your intestinal tract. The contrast solution may cause some diarrhea. Depending on your individual set of symptoms, you may also receive an intravenous injection of x-ray contrast/dye. Plan on being at [facility] for 30 minutes or longer, depending on the type of  exam you are having performed.  This test typically takes 30-45 minutes to complete.  If you have any questions regarding your exam or if you need to reschedule, you may call the CT department at [phone] between the hours of 8:00 am and 5:00 pm, Monday-Friday.    ________________________________________________________________________  Please go to the lab on the 2nd floor [address] before you leave the office today.    Meals  Choose healthy foods that are low in fat, such as fruits, vegetables, whole grains, low-fat dairy products, and lean meat, fish, and poultry.  Eat small meals often instead of 3 large meals a day. Eat your meals slowly, and in a place where you are relaxed. Avoid bending over or lying down until 2-3 hours after eating.  Avoid eating meals 2-3 hours before bed.  Avoid drinking a lot of liquid with meals.  Cook foods using methods other than frying. Bake, grill, or broil food instead.  Avoid or limit: ? Chocolate. ? Peppermint or spearmint. ? Alcohol. ? Pepper. ? Black and decaffeinated coffee. ? Black and decaffeinated tea. ? Bubbly (carbonated) soft drinks. ? Caffeinated energy drinks and soft drinks.  Limit high-fat foods such as: ? Fatty meat or fried foods. ? Whole milk, cream, butter, or ice cream. ? Nuts and nut butters. ? Pastries, donuts, and sweets made with butter or shortening.  Avoid foods that cause symptoms. These foods may be different for everyone. Common foods that cause  symptoms include: ? Tomatoes. ? Oranges, lemons, and limes. ? Peppers. ? Spicy food. ? Onions and garlic. ? Vinegar. Lifestyle  Maintain a healthy weight. Ask your doctor what weight is healthy for you. If you need to lose weight, work with your doctor to do so safely.  Exercise for at least 30 minutes for 5 or more days each week, or as told by your doctor.  Wear loose-fitting clothes.  Do not smoke. If you need help quitting, ask your  doctor.  Sleep with the head of your bed higher than your feet. Use a wedge under the mattress or blocks under the bed frame to raise the head of the bed. Summary  When you have gastroesophageal reflux disease (GERD), food and lifestyle choices are very important in easing your symptoms.  Eat small meals often instead of 3 large meals a day. Eat your meals slowly, and in a place where you are relaxed.  Limit high-fat foods such as fatty meat or fried foods.  Avoid bending over or lying down until 2-3 hours after eating.  Avoid peppermint and spearmint, caffeine, alcohol, and chocolate. This information is not intended to replace advice given to you by your health care provider. Make sure you discuss any questions you have with your health care provider. Document Released: 01/23/2012 Document Revised: 08/29/2016 Document Reviewed: 08/29/2016 Elsevier Interactive Patient Education  2019 [employer].   Thank you,  Dr. Jackquline Denmark

## 2018-08-01 NOTE — Progress Notes (Signed)
Chief Complaint:   Referring Provider:  Hali Marry, *      ASSESSMENT AND PLAN;   #1. Rectal bleeding. D/d hoids, AVMs, colitis, polyps, stercoral ulcers etc, r/o colonic neoplasms or IBD. Nl Hb 2019 #2. Lower abd pain #3. IBS with alt constipation and diarrhea. #4. Fatty liver Dx on [place] 2014 with Nl LFTs.  Plan: - CT abdo/pelvis with p.o. and IV contrast. - Diet for senstive stomachs - Check celiac screen. - Proceed with colonoscopy .  I have discussed the risks and benefits.  The risks including risk of perforation requiring laparotomy, bleeding after polypectomy requiring blood transfusions and risks of anesthesia/sedation were discussed.  Rare risks of missing colorectal neoplasms were also discussed.  Alternatives were given.  Patient is fully aware and agrees to proceed. All the questions were answered. Colonoscopy will be scheduled in upcoming days.  Patient is to report immediately if there is any significant weight loss or excessive bleeding until then. Consent forms were given for review. - Discussed gradual weight loss as the only treatment for fatty liver currently.  She does understand.   HPI:    Brenda Sharp is a 37 y.o. female  Lower abd pain, mainly left lower quadrant Alt dirrhea and constipation, predominantly constipation with passage of pellet-like stools. Rectal bleeding -off and on for 1 year, bright red in color, occasionally mixed with the stool.  No history suggestive of tenesmus. No perirectal burning or pain. She had normal CBC  No joint pains.  No skin rash. No jaundice dark urine or pale stools. No nausea, vomiting, heartburn, regurgitation, odynophagia or dysphagia. There is no melena. No unintentional weight loss.    Past Medical History:  Diagnosis Date  . Gout   . Headache(784.0)   . Hypertension   . Hypothyroidism   . Kidney stone   . Miscarriage   . PCOS (polycystic ovarian syndrome)   . Torticollis     Past Surgical  History:  Procedure Laterality Date  . LITHOTRIPSY    . TONSILLECTOMY  1990  . tophi Left 2017   gout related   . WISDOM TOOTH EXTRACTION  2006    Family History  Problem Relation Age of Onset  . Hypertension Father   . Gout Father   . Depression Mother   . Lung cancer Paternal Grandfather        smoker  . Heart attack Paternal Grandfather   . Heart attack Paternal Grandmother   . Heart attack Maternal Grandfather   . Colon cancer Neg Hx   . Esophageal cancer Neg Hx     Social History   Tobacco Use  . Smoking status: Never Smoker  . Smokeless tobacco: Never Used  Substance Use Topics  . Alcohol use: No    Alcohol/week: 0.0 standard drinks  . Drug use: No    Current Outpatient Medications  Medication Sig Dispense Refill  . DULoxetine (CYMBALTA) 60 MG capsule Take 1 capsule (60 mg total) by mouth 2 (two) times daily. 60 capsule 6  . febuxostat (ULORIC) 40 MG tablet Take 1 tablet (40 mg total) by mouth daily. 90 tablet 0  . indomethacin (INDOCIN) 50 MG capsule TAKE ONE CAPSULE BY MOUTH TWICE DAILY, WITH A MEAL (Patient taking differently: as needed. TAKE ONE CAPSULE BY MOUTH TWICE DAILY, WITH A MEAL) 90 capsule 3  . labetalol (NORMODYNE) 300 MG tablet TAKE 1 TABLET(300 MG) BY MOUTH TWICE DAILY 60 tablet 1  . levothyroxine (SYNTHROID, LEVOTHROID) 112 MCG  tablet TAKE 1 TABLET(112 MCG) BY MOUTH DAILY 90 tablet 3  . metFORMIN (GLUCOPHAGE-XR) 500 MG 24 hr tablet Take 2 tablets (1,000 mg total) by mouth daily with breakfast. 180 tablet 3  . NIFEdipine (PROCARDIA-XL/ADALAT-CC/NIFEDICAL-XL) 30 MG 24 hr tablet TAKE 1 TABLET(30 MG) BY MOUTH DAILY 90 tablet 3  . nitroGLYCERIN (NITROSTAT) 0.4 MG SL tablet Place 1 tablet (0.4 mg total) under the tongue every 5 (five) minutes as needed for chest pain. 25 tablet 6  . saxagliptin HCl (ONGLYZA) 5 MG TABS tablet Take 1 tablet (5 mg total) by mouth daily. 30 tablet 2   No current facility-administered medications for this visit.      Allergies  Allergen Reactions  . Imitrex [Sumatriptan]     Chest pain  . Januvia [Sitagliptin] Other (See Comments)    Nausea, GI upset  . Penicillins     Other reaction(s): Other migraines  . Victoza [Liraglutide] Nausea Only  . Colchicine Nausea Only  . Trulicity [Dulaglutide] Nausea Only    Review of Systems:  Constitutional: Denies fever, chills, diaphoresis, appetite change and fatigue.  HEENT: Denies photophobia, eye pain, redness, hearing loss, ear pain, congestion, sore throat, rhinorrhea, sneezing, mouth sores, neck pain, neck stiffness and tinnitus.   Respiratory: Denies SOB, DOE, cough, chest tightness,  and wheezing.   Cardiovascular: Denies chest pain, palpitations and leg swelling.  Genitourinary: Denies dysuria, urgency, frequency, hematuria, flank pain and difficulty urinating.  Musculoskeletal: Denies myalgias, back pain, joint swelling, arthralgias and gait problem.  Skin: No rash.  Neurological: Denies dizziness, seizures, syncope, weakness, light-headedness, numbness and headaches.  Hematological: Denies adenopathy. Easy bruising, personal or family bleeding history  Psychiatric/Behavioral: No anxiety or depression     Physical Exam:    BP 132/84   Pulse (!) 102   Ht 5\' 3"  (1.6 m)   Wt 254 lb 2 oz (115.3 kg)   BMI 45.02 kg/m  Filed Weights   08/01/18 1449  Weight: 254 lb 2 oz (115.3 kg)   Constitutional:  Well-developed, in no acute distress. Psychiatric: Normal mood and affect. Behavior is normal. HEENT: Pupils normal.  Conjunctivae are normal. No scleral icterus. Neck supple.  Cardiovascular: Normal rate, regular rhythm. No edema Pulmonary/chest: Effort normal and breath sounds normal. No wheezing, rales or rhonchi. Abdominal: Soft, nondistended.  Left lower quadrant abdominal tenderness.  No rebound. Bowel sounds active throughout. There are no masses palpable. No hepatomegaly. Rectal: To be performed at the time of  colonoscopy Neurological: Alert and oriented to person place and time. Skin: Skin is warm and dry. No rashes noted.  Data Reviewed: I have personally reviewed following labs and imaging studies  CBC: CBC Latest Ref Rng & Units 07/03/2018 04/28/2016 05/05/2015  WBC 3.8 - 10.8 Thousand/uL 7.0 11.6(H) 9.3  Hemoglobin 11.7 - 15.5 g/dL 14.3 14.8 14.9  Hematocrit 35.0 - 45.0 % 42.2 44.6 42.4  Platelets 140 - 400 Thousand/uL 377 352 302    CMP: CMP Latest Ref Rng & Units 07/03/2018 02/25/2018 04/28/2016  Glucose 65 - 99 mg/dL 121(H) 117(H) 145(H)  BUN 7 - 25 mg/dL 12 13 14   Creatinine 0.50 - 1.10 mg/dL 0.91 0.93 0.90  Sodium 135 - 146 mmol/L 143 139 141  Potassium 3.5 - 5.3 mmol/L 4.6 4.3 4.5  Chloride 98 - 110 mmol/L 107 103 103  CO2 20 - 32 mmol/L 27 27 27   Calcium 8.6 - 10.2 mg/dL 9.7 9.6 9.7  Total Protein 6.1 - 8.1 g/dL 6.4 6.5 6.9  Total Bilirubin 0.2 -  1.2 mg/dL 0.5 0.4 0.5  Alkaline Phos 33 - 115 U/L - - 73  AST 10 - 30 U/L 24 20 28   ALT 6 - 29 U/L 27 23 41(H)      Carmell Austria, MD 08/01/2018, 3:00 PM  Cc: Hali Marry, *

## 2018-08-02 ENCOUNTER — Ambulatory Visit (HOSPITAL_BASED_OUTPATIENT_CLINIC_OR_DEPARTMENT_OTHER)
Admission: RE | Admit: 2018-08-02 | Discharge: 2018-08-02 | Disposition: A | Discharge status: Home / Self Care | Payer: BC Managed Care – PPO | Source: Ambulatory Visit | Attending: Cardiology | Admitting: Cardiology

## 2018-08-02 DIAGNOSIS — R0789 Other chest pain: Secondary | ICD-10-CM | POA: Insufficient documentation

## 2018-08-02 DIAGNOSIS — I10 Essential (primary) hypertension: Secondary | ICD-10-CM | POA: Diagnosis not present

## 2018-08-02 NOTE — Progress Notes (Signed)
  Echocardiogram 2D Echocardiogram has been performed.  Brenda Sharp M 08/02/2018, 9:58 AM

## 2018-08-03 LAB — CELIAC PANEL 10
Antigliadin Abs, IgA: 2 units (ref 0–19)
ENDOMYSIAL IGA: NEGATIVE
Gliadin IgG: 4 units (ref 0–19)
IgA/Immunoglobulin A, Serum: 22 mg/dL — ABNORMAL LOW (ref 87–352)
Tissue Transglut Ab: <2 U/mL (ref 0–5)
Transglutaminase IgA: <2 U/mL (ref 0–3)

## 2018-08-05 ENCOUNTER — Telehealth: Payer: Self-pay

## 2018-08-05 NOTE — Telephone Encounter (Signed)
-----   Message from Jenean Lindau, MD sent at 08/05/2018  8:51 AM EST ----- The results of the study is unremarkable. Please inform patient. I will discuss in detail at next appointment. Cc  primary care/referring physician Jenean Lindau, MD 08/05/2018 8:51 AM

## 2018-08-05 NOTE — Telephone Encounter (Signed)
Called patient and left detailed voice message on patients phone regarding test results. 

## 2018-08-08 ENCOUNTER — Encounter (HOSPITAL_BASED_OUTPATIENT_CLINIC_OR_DEPARTMENT_OTHER): Payer: Self-pay

## 2018-08-08 ENCOUNTER — Ambulatory Visit (HOSPITAL_BASED_OUTPATIENT_CLINIC_OR_DEPARTMENT_OTHER)
Admission: RE | Admit: 2018-08-08 | Discharge: 2018-08-08 | Disposition: A | Discharge status: Home / Self Care | Payer: BC Managed Care – PPO | Source: Ambulatory Visit | Attending: Gastroenterology | Admitting: Gastroenterology

## 2018-08-08 DIAGNOSIS — K625 Hemorrhage of anus and rectum: Secondary | ICD-10-CM

## 2018-08-08 DIAGNOSIS — K589 Irritable bowel syndrome without diarrhea: Secondary | ICD-10-CM | POA: Diagnosis present

## 2018-08-08 DIAGNOSIS — R1032 Left lower quadrant pain: Secondary | ICD-10-CM | POA: Diagnosis present

## 2018-08-08 MED ORDER — IOPAMIDOL (ISOVUE-300) INJECTION 61%
100.0000 mL | Freq: Once | INTRAVENOUS | Status: AC | PRN
  Administered 2018-08-08: 100 mL via INTRAVENOUS

## 2018-08-14 ENCOUNTER — Telehealth: Payer: Self-pay

## 2018-08-14 DIAGNOSIS — K625 Hemorrhage of anus and rectum: Secondary | ICD-10-CM

## 2018-08-14 DIAGNOSIS — R1032 Left lower quadrant pain: Secondary | ICD-10-CM

## 2018-08-14 DIAGNOSIS — K589 Irritable bowel syndrome without diarrhea: Secondary | ICD-10-CM

## 2018-08-14 MED ORDER — NA SULFATE-K SULFATE-MG SULF 17.5-3.13-1.6 GM/177ML PO SOLN: 1.0000 | Freq: Once | ORAL | 0 refills | Status: AC

## 2018-08-14 NOTE — Telephone Encounter (Signed)
amb ref to gastro ordered in Epic; [facility] sent to pharmacy;  Left message for patient to call back concerning the colonoscopy prep instructions that were mailed to her;

## 2018-08-16 ENCOUNTER — Encounter: Payer: Self-pay | Admitting: Gastroenterology

## 2018-08-22 ENCOUNTER — Telehealth (HOSPITAL_COMMUNITY): Payer: Self-pay | Admitting: *Deleted

## 2018-08-22 NOTE — Telephone Encounter (Signed)
Left message on voicemail in reference to upcoming appointment scheduled for 08/26/18 Phone number given for a call back so details instructions can be given. Tamala Julian, Stev[place]

## 2018-08-26 ENCOUNTER — Ambulatory Visit (HOSPITAL_COMMUNITY): Payer: BC Managed Care – PPO

## 2018-08-27 ENCOUNTER — Ambulatory Visit (HOSPITAL_COMMUNITY): Payer: BC Managed Care – PPO

## 2018-08-30 ENCOUNTER — Other Ambulatory Visit: Payer: Self-pay | Admitting: Family Medicine

## 2018-08-30 ENCOUNTER — Encounter: Payer: Self-pay | Admitting: Gastroenterology

## 2018-08-30 ENCOUNTER — Ambulatory Visit (AMBULATORY_SURGERY_CENTER): Payer: BC Managed Care – PPO | Admitting: Gastroenterology

## 2018-08-30 VITALS — BP 152/99 | HR 70 | Temp 98.9°F | Resp 13 | Ht 63.0 in | Wt 254.0 lb

## 2018-08-30 DIAGNOSIS — K625 Hemorrhage of anus and rectum: Secondary | ICD-10-CM

## 2018-08-30 DIAGNOSIS — K621 Rectal polyp: Secondary | ICD-10-CM | POA: Diagnosis not present

## 2018-08-30 DIAGNOSIS — D129 Benign neoplasm of anus and anal canal: Secondary | ICD-10-CM

## 2018-08-30 DIAGNOSIS — R1032 Left lower quadrant pain: Secondary | ICD-10-CM | POA: Diagnosis present

## 2018-08-30 DIAGNOSIS — R197 Diarrhea, unspecified: Secondary | ICD-10-CM

## 2018-08-30 DIAGNOSIS — D128 Benign neoplasm of rectum: Secondary | ICD-10-CM

## 2018-08-30 MED ORDER — SODIUM CHLORIDE 0.9 % IV SOLN: 500.0000 mL | Freq: Once | INTRAVENOUS | Status: DC

## 2018-08-30 NOTE — Patient Instructions (Signed)
YOU HAD AN ENDOSCOPIC PROCEDURE TODAY AT [facility]:   Refer to the procedure report that was given to you for any specific questions about what was found during the examination.  If the procedure report does not answer your questions, please call your gastroenterologist to clarify.  If you requested that your care partner not be given the details of your procedure findings, then the procedure report has been included in a sealed envelope for you to review at your convenience later.   **Handout given on polyps**   YOU SHOULD EXPECT: Some feelings of bloating in the abdomen. Passage of more gas than usual.  Walking can help get rid of the air that was put into your GI tract during the procedure and reduce the bloating. If you had a lower endoscopy (such as a colonoscopy or flexible sigmoidoscopy) you may notice spotting of blood in your stool or on the toilet paper. If you underwent a bowel prep for your procedure, you may not have a normal bowel movement for a few days.  Please Note:  You might notice some irritation and congestion in your nose or some drainage.  This is from the oxygen used during your procedure.  There is no need for concern and it should clear up in a day or so.  SYMPTOMS TO REPORT IMMEDIATELY:   Following lower endoscopy (colonoscopy or flexible sigmoidoscopy):  Excessive amounts of blood in the stool  Significant tenderness or worsening of abdominal pains  Swelling of the abdomen that is new, acute  Fever of 100F or higher    For urgent or emergent issues, a gastroenterologist can be reached at any hour by calling [phone].   DIET:  We do recommend a small meal at first, but then you may proceed to your regular diet.  Drink plenty of fluids but you should avoid alcoholic beverages for 24 hours.  ACTIVITY:  You should plan to take it easy for the rest of today and you should NOT DRIVE or use heavy machinery until tomorrow (because of the  sedation medicines used during the test).    FOLLOW UP: Our staff will call the number listed on your records the next business day following your procedure to check on you and address any questions or concerns that you may have regarding the information given to you following your procedure. If we do not reach you, we will leave a message.  However, if you are feeling well and you are not experiencing any problems, there is no need to return our call.  We will assume that you have returned to your regular daily activities without incident.  If any biopsies were taken you will be contacted by phone or by letter within the next 1-3 weeks.  Please call us at [phone] if you have not heard about the biopsies in 3 weeks.    SIGNATURES/CONFIDENTIALITY: You and/or your care partner have signed paperwork which will be entered into your electronic medical record.  These signatures attest to the fact that that the information above on your After Visit Summary has been reviewed and is understood.  Full responsibility of the confidentiality of this discharge information lies with you and/or your care-partner.

## 2018-08-30 NOTE — Progress Notes (Signed)
Called to room to assist during endoscopic procedure.  Patient ID and intended procedure confirmed with present staff. Received instructions for my participation in the procedure from the performing physician.  

## 2018-08-30 NOTE — Progress Notes (Signed)
A/ox3, pleased with MAC, report to RN 

## 2018-08-30 NOTE — Op Note (Signed)
[facility] Patient Name: Brenda Sharp Procedure Date: 08/30/2018 8:30 AM MRN: [id number] Endoscopist: Jackquline Denmark , MD Age: 38 Referring MD:  Date of Birth: [birth year] Gender: Female Account #: [id number] Procedure:                Colonoscopy Indications:              Diarrhea, Rectal bleeding, left lower quadrant                            abdominal pain Medicines:                Monitored Anesthesia Care Procedure:                Pre-Anesthesia Assessment:                           - Prior to the procedure, a History and Physical                            was performed, and patient medications and                            allergies were reviewed. The patient's tolerance of                            previous anesthesia was also reviewed. The risks                            and benefits of the procedure and the sedation                            options and risks were discussed with the patient.                            All questions were answered, and informed consent                            was obtained. Prior Anticoagulants: The patient has                            taken no previous anticoagulant or antiplatelet                            agents. ASA Grade Assessment: II - A patient with                            mild systemic disease. After reviewing the risks                            and benefits, the patient was deemed in                            satisfactory condition to undergo the procedure.  After obtaining informed consent, the colonoscope                            was passed under direct vision. Throughout the                            procedure, the patient's blood pressure, pulse, and                            oxygen saturations were monitored continuously. The                            Colonoscope was introduced through the anus and                            advanced to the 4 cm into the ileum. The                colonoscopy was performed without difficulty. The                            patient tolerated the procedure well. The quality                            of the bowel preparation was adequate to identify                            polyps. Some retained stool with vegetable                            material. Aggressive suctioning and aspiration was                            performed. Overall over 90 to 95% of colonic mucosa                            was visualized satisfactorily. Scope In: 8:34:40 AM Scope Out: 8:55:57 AM Scope Withdrawal Time: 0 hours 15 minutes 37 seconds  Total Procedure Duration: 0 hours 21 minutes 17 seconds  Findings:                 A 4 mm polyp was found in the rectum. The polyp was                            sessile. The polyp was removed with a cold snare.                            Resection and retrieval were complete. Estimated                            blood loss: none.                           Non-bleeding internal hemorrhoids were found during  retroflexion. The hemorrhoids were small.                           The colon (entire examined portion) appeared                            normal. Biopsies for histology were taken with a                            cold forceps for evaluation of microscopic colitis.                           The exam was otherwise without abnormality on                            direct and retroflexion views.                           The terminal ileum appeared normal. Biopsies were                            taken with a cold forceps for histology. Complications:            No immediate complications. Estimated Blood Loss:     Estimated blood loss: none. Impression:               -Diminutive colonic polyp status post polypectomy                           -Small internal hemorrhoids.                           -Otherwise normal colonoscopy to TI. Recommendation:           - Patient has  a contact number available for                            emergencies. The signs and symptoms of potential                            delayed complications were discussed with the                            patient. Return to normal activities tomorrow.                            Written discharge instructions were provided to the                            patient.                           - Resume previous diet.                           - Continue present medications. If any further  bleeding, she would use Preparation H cream 1 twice                            daily after the bowel movement for 7 to 10 days.                           - Await pathology results.                           - Repeat colonoscopy for surveillance based on                            pathology results.                           - Return to GI clinic in 12 weeks. Jackquline Denmark, MD 08/30/2018 9:00:52 AM This report has been signed electronically.

## 2018-09-02 ENCOUNTER — Telehealth: Payer: Self-pay

## 2018-09-02 NOTE — Telephone Encounter (Signed)
  Follow up Call-  Call back number 08/30/2018  Post procedure Call Back phone  # [phone]  Permission to leave phone message Yes  Some recent data might be hidden     Patient questions:  Do you have a fever, pain , or abdominal swelling? No. Pain Score  0 *  Have you tolerated food without any problems? Yes.    Have you been able to return to your normal activities? Yes.    Do you have any questions about your discharge instructions: Diet   No. Medications  No. Follow up visit  No.  Do you have questions or concerns about your Care? No.  Actions: * If pain score is 4 or above: No action needed, pain <4.

## 2018-09-03 ENCOUNTER — Encounter: Payer: Self-pay | Admitting: Gastroenterology

## 2018-10-04 ENCOUNTER — Ambulatory Visit: Payer: BC Managed Care – PPO | Admitting: Family Medicine

## 2018-11-26 ENCOUNTER — Encounter: Payer: Self-pay | Admitting: Gastroenterology

## 2019-05-30 ENCOUNTER — Other Ambulatory Visit: Payer: Self-pay

## 2019-05-30 ENCOUNTER — Encounter: Payer: Self-pay | Admitting: Family Medicine

## 2019-05-30 ENCOUNTER — Ambulatory Visit (INDEPENDENT_AMBULATORY_CARE_PROVIDER_SITE_OTHER): Payer: BC Managed Care – PPO | Admitting: Family Medicine

## 2019-05-30 VITALS — BP 135/81 | HR 112 | Ht 63.0 in | Wt 261.0 lb

## 2019-05-30 DIAGNOSIS — E1169 Type 2 diabetes mellitus with other specified complication: Secondary | ICD-10-CM

## 2019-05-30 DIAGNOSIS — I10 Essential (primary) hypertension: Secondary | ICD-10-CM

## 2019-05-30 DIAGNOSIS — E088 Diabetes mellitus due to underlying condition with unspecified complications: Secondary | ICD-10-CM

## 2019-05-30 DIAGNOSIS — M1A9XX1 Chronic gout, unspecified, with tophus (tophi): Secondary | ICD-10-CM

## 2019-05-30 DIAGNOSIS — E038 Other specified hypothyroidism: Secondary | ICD-10-CM

## 2019-05-30 DIAGNOSIS — Z23 Encounter for immunization: Secondary | ICD-10-CM | POA: Diagnosis not present

## 2019-05-30 DIAGNOSIS — F3341 Major depressive disorder, recurrent, in partial remission: Secondary | ICD-10-CM

## 2019-05-30 LAB — POCT GLYCOSYLATED HEMOGLOBIN (HGB A1C): Hemoglobin A1C: 6 % — AB (ref 4.0–5.6)

## 2019-05-30 LAB — TSH: TSH: 3.1 mIU/L

## 2019-05-30 MED ORDER — DULOXETINE HCL 30 MG PO CPEP: 30.0000 mg | ORAL_CAPSULE | Freq: Every day | ORAL | 0 refills | Status: DC

## 2019-05-30 NOTE — Assessment & Plan Note (Signed)
Okay to continue her labetalol and nifedipine.  Discussed the importance of keeping her blood pressure under good control.  Usually blood pressure drops a little bit during pregnancy.

## 2019-05-30 NOTE — Assessment & Plan Note (Signed)
Due to recheck TSH.  Will likely need to go up on thyroid dose by 30% bc of pregnancy. Will check TSH today.

## 2019-05-30 NOTE — Patient Instructions (Addendum)
Please stop your Onglyza, indomethacin, and nitroglycerin.  We are going to decrease your Cymbalta.  Alternate the 60 mg and 30 mg every other day.  When she been on that regimen for about 3 weeks if you are still doing well then decrease down to 30 mg once a day.  We will likely need to increase your thyroid medication.  We will continue the Uloric for now.

## 2019-05-30 NOTE — Progress Notes (Signed)
Established Patient Office Visit  Subjective:  Patient ID: Brenda Sharp, female    DOB: [birth year]  Age: 38 y.o. MRN: [id number]  CC:  Chief Complaint  Patient presents with  . Follow-up    HPI Brenda Sharp presents for to go over her medications since she is now pregnant. She is approx 9 weeks.  She has been to [facility] for care.  She is currently taking a prenatal vitamin.  They did not change any of her medications but recommended that she come in for consultation to go through her medication list and make adjustments.  She does have a history of hypertension and gout.   Past Medical History:  Diagnosis Date  . Gout   . Headache(784.0)   . Hypertension   . Hypothyroidism   . Kidney stone   . Miscarriage   . PCOS (polycystic ovarian syndrome)   . 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 Torticollis    treated with botox in 2016    Past Surgical History:  Procedure Laterality Date  . LITHOTRIPSY    . OTHER SURGICAL HISTORY     done a procedure to rid of arthritis of the right big toe per patient   . OTHER SURGICAL HISTORY Left 2017   gout related-tophi per patient   . TONSILLECTOMY  1991  . WISDOM TOOTH EXTRACTION  2006    Family History  Problem Relation Age of Onset  . Hypertension Father   . Gout Father   . Depression Mother   . Lung cancer Paternal Grandfather        smoker  . Heart attack Paternal Grandfather   . Heart attack Paternal Grandmother   . Heart attack Maternal Grandfather   . Colon cancer Neg Hx   . Esophageal cancer Neg Hx     Social History   Socioeconomic History  . Marital status: Married    Spouse name: Ed  . Number of children: 0  . Years of education: college  . Highest education level: Not on file  Occupational History  . 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 Financial resource strain: Not on file  . Food insecurity    Worry: Not on file    Inability: Not on file  . Transportation needs    Medical: Not on  file    Non-medical: Not on file  Tobacco Use  . Smoking status: Never Smoker  . Smokeless tobacco: Never Used  Substance and Sexual Activity  . Alcohol use: No    Alcohol/week: 0.0 standard drinks  . Drug use: No  . Sexual activity: Yes    Partners: Male  Lifestyle  . Physical activity    Days per week: Not on file    Minutes per session: Not on file  . Stress: Not on file  Relationships  . Social Herbalist on phone: Not on file    Gets together: Not on file    Attends religious service: Not on file    Active member of club or organization: Not on file    Attends meetings of clubs or organizations: Not on file    Relationship status: Not on file  . Intimate partner violence    Fear of current or ex partner: Not on file    Emotionally abused: Not on file    Physically abused: Not on file    Forced sexual activity: Not on file  Other Topics  Concern  . Not on file  Social History Narrative   1 cup caffeine/day   Right-handed.   Lives at home with husband.    Outpatient Medications Prior to Visit  Medication Sig Dispense Refill  . febuxostat (ULORIC) 40 MG tablet Take 1 tablet (40 mg total) by mouth daily. 90 tablet 0  . labetalol (NORMODYNE) 300 MG tablet TAKE 1 TABLET(300 MG) BY MOUTH TWICE DAILY 180 tablet 1  . levothyroxine (SYNTHROID, LEVOTHROID) 112 MCG tablet TAKE 1 TABLET(112 MCG) BY MOUTH DAILY 90 tablet 3  . metFORMIN (GLUCOPHAGE-XR) 500 MG 24 hr tablet Take 2 tablets (1,000 mg total) by mouth daily with breakfast. 180 tablet 3  . NIFEdipine (PROCARDIA-XL/ADALAT-CC/NIFEDICAL-XL) 30 MG 24 hr tablet TAKE 1 TABLET(30 MG) BY MOUTH DAILY 90 tablet 3  . DULoxetine (CYMBALTA) 60 MG capsule Take 1 capsule (60 mg total) by mouth 2 (two) times daily. 180 capsule 1  . indomethacin (INDOCIN) 50 MG capsule TAKE ONE CAPSULE BY MOUTH TWICE DAILY, WITH A MEAL (Patient taking differently: as needed. TAKE ONE CAPSULE BY MOUTH TWICE DAILY, WITH A MEAL) 90 capsule 3  .  nitroGLYCERIN (NITROSTAT) 0.4 MG SL tablet Place 1 tablet (0.4 mg total) under the tongue every 5 (five) minutes as needed for chest pain. 25 tablet 6  . saxagliptin HCl (ONGLYZA) 5 MG TABS tablet Take 1 tablet (5 mg total) by mouth daily. 30 tablet 2   No facility-administered medications prior to visit.     Allergies  Allergen Reactions  . Imitrex [Sumatriptan]     Chest pain  . Januvia [Sitagliptin] Other (See Comments)    Nausea, GI upset  . Penicillins     Other reaction(s): Other migraines  . Victoza [Liraglutide] Nausea Only  . Colchicine Nausea Only  . Trulicity [Dulaglutide] Nausea Only    ROS Review of Systems    Objective:    Physical Exam  Constitutional: She is oriented to person, place, and time. She appears well-developed and well-nourished.  HENT:  Head: Normocephalic and atraumatic.  Cardiovascular: Normal rate, regular rhythm and normal heart sounds.  Pulmonary/Chest: Effort normal and breath sounds normal.  Neurological: She is alert and oriented to person, place, and time.  Skin: Skin is warm and dry.  Psychiatric: She has a normal mood and affect. Her behavior is normal.    BP 135/81   Pulse (!) 112   Ht 5\' 3"  (1.6 m)   Wt 261 lb (118.4 kg)   LMP 03/10/2019 (Approximate)   SpO2 99%   BMI 46.23 kg/m  Wt Readings from Last 3 Encounters:  05/30/19 261 lb (118.4 kg)  08/30/18 254 lb (115.2 kg)  08/01/18 254 lb 2 oz (115.3 kg)     Health Maintenance Due  Topic Date Due  . FOOT EXAM  11/10/2018  . OPHTHALMOLOGY EXAM  04/23/2019  . URINE MICROALBUMIN  07/04/2019    There are no preventive care reminders to display for this patient.  Lab Results  Component Value Date   TSH 3.10 05/30/2019   Lab Results  Component Value Date   WBC 7.0 07/03/2018   HGB 14.3 07/03/2018   HCT 42.2 07/03/2018   MCV 89.0 07/03/2018   PLT 377 07/03/2018   Lab Results  Component Value Date   NA 143 07/03/2018   K 4.6 07/03/2018   CO2 27 07/03/2018    GLUCOSE 121 (H) 07/03/2018   BUN 12 07/03/2018   CREATININE 0.91 07/03/2018   BILITOT 0.5 07/03/2018   ALKPHOS 73  04/28/2016   AST 24 07/03/2018   ALT 27 07/03/2018   PROT 6.4 07/03/2018   ALBUMIN 4.6 04/28/2016   CALCIUM 9.7 07/03/2018   Lab Results  Component Value Date   CHOL 237 (H) 02/25/2018   Lab Results  Component Value Date   HDL 38 (L) 02/25/2018   Lab Results  Component Value Date   LDLCALC 153 (H) 02/25/2018   Lab Results  Component Value Date   TRIG 278 (H) 02/25/2018   Lab Results  Component Value Date   CHOLHDL 6.2 (H) 02/25/2018   Lab Results  Component Value Date   HGBA1C 6.0 (A) 05/30/2019      Assessment & Plan:   Problem List Items Addressed This Visit      Cardiovascular and Mediastinum   Essential hypertension, benign    Okay to continue her labetalol and nifedipine.  Discussed the importance of keeping her blood pressure under good control.  Usually blood pressure drops a little bit during pregnancy.        Endocrine   Hypothyroidism    Due to recheck TSH.  Will likely need to go up on thyroid dose by 30% bc of pregnancy. Will check TSH today.        Relevant Orders   TSH (Completed)   Diabetes mellitus due to underlying condition with unspecified complications ([place])    D/C onglyza and continue metformin. Work on [employer] and exercise.  Consider nutrition referral.          Other   Tophi gouty    Discussed caution with uloric but she has quite severe disease so will continue medication for now.       Depression    Discussed caution with Cymbalta and pros and cons of continuing medication during pregnancy. For now will work on decreasing her dose and trying to taper by the 3rd trimester. But also discussed managing her depression is extremely important.       Relevant Medications   DULoxetine (CYMBALTA) 30 MG capsule    Other Visit Diagnoses    Controlled type 2 diabetes mellitus with other specified complication,  without long-term current use of insulin ([place])    -  Primary   Relevant Orders   POCT glycosylated hemoglobin (Hb A1C) (Completed)   Need for immunization against influenza       Relevant Orders   Flu Vaccine QUAD 36+ mos IM (Completed)      Meds ordered this encounter  Medications  . DULoxetine (CYMBALTA) 30 MG capsule    Sig: Take 1 capsule (30 mg total) by mouth daily.    Dispense:  30 capsule    Refill:  0    Follow-up: Return w+6, for recheck on dose changeon your medication. Beatrice Lecher, MD

## 2019-06-02 ENCOUNTER — Encounter: Payer: Self-pay | Admitting: Family Medicine

## 2019-06-02 NOTE — Assessment & Plan Note (Signed)
Discussed caution with Cymbalta and pros and cons of continuing medication during pregnancy. For now will work on decreasing her dose and trying to taper by the 3rd trimester. But also discussed managing her depression is extremely important.

## 2019-06-02 NOTE — Assessment & Plan Note (Signed)
D/C onglyza and continue metformin. Work on [employer] and exercise.  Consider nutrition referral.

## 2019-06-02 NOTE — Assessment & Plan Note (Signed)
Discussed caution with uloric but she has quite severe disease so will continue medication for now.

## 2019-06-09 LAB — RESULTS CONSOLE HPV: CHL HPV: NEGATIVE

## 2019-06-09 LAB — OB RESULTS CONSOLE ABO/RH: RH Type: POSITIVE

## 2019-06-09 LAB — OB RESULTS CONSOLE HIV ANTIBODY (ROUTINE TESTING): HIV: NONREACTIVE

## 2019-06-09 LAB — OB RESULTS CONSOLE RPR: RPR: NONREACTIVE

## 2019-06-09 LAB — OB RESULTS CONSOLE RUBELLA ANTIBODY, IGM: Rubella: IMMUNE

## 2019-06-09 LAB — OB RESULTS CONSOLE HEPATITIS B SURFACE ANTIGEN: Hepatitis B Surface Ag: NEGATIVE

## 2019-06-09 LAB — HM PAP SMEAR: HM Pap smear: NEGATIVE

## 2019-06-09 LAB — OB RESULTS CONSOLE GC/CHLAMYDIA
Chlamydia: NEGATIVE
Gonorrhea: NEGATIVE

## 2019-06-09 LAB — OB RESULTS CONSOLE ANTIBODY SCREEN: Antibody Screen: NEGATIVE

## 2019-07-10 ENCOUNTER — Ambulatory Visit: Payer: BC Managed Care – PPO | Admitting: Family Medicine

## 2019-07-14 ENCOUNTER — Other Ambulatory Visit: Payer: Self-pay | Admitting: *Deleted

## 2019-07-14 MED ORDER — DULOXETINE HCL 30 MG PO CPEP: 30.0000 mg | ORAL_CAPSULE | Freq: Every day | ORAL | 0 refills | Status: DC

## 2019-07-18 ENCOUNTER — Other Ambulatory Visit: Payer: Self-pay

## 2019-07-18 ENCOUNTER — Ambulatory Visit: Payer: BC Managed Care – PPO | Admitting: Family Medicine

## 2019-07-18 ENCOUNTER — Encounter: Payer: Self-pay | Admitting: Family Medicine

## 2019-07-18 VITALS — BP 138/89 | HR 101 | Ht 63.0 in | Wt 263.0 lb

## 2019-07-18 DIAGNOSIS — I10 Essential (primary) hypertension: Secondary | ICD-10-CM

## 2019-07-18 DIAGNOSIS — G243 Spasmodic torticollis: Secondary | ICD-10-CM | POA: Diagnosis not present

## 2019-07-18 DIAGNOSIS — E038 Other specified hypothyroidism: Secondary | ICD-10-CM | POA: Diagnosis not present

## 2019-07-18 DIAGNOSIS — F3341 Major depressive disorder, recurrent, in partial remission: Secondary | ICD-10-CM | POA: Diagnosis not present

## 2019-07-18 MED ORDER — DULOXETINE HCL 30 MG PO CPEP: 30.0000 mg | ORAL_CAPSULE | Freq: Every day | ORAL | 1 refills | Status: DC

## 2019-07-18 NOTE — Assessment & Plan Note (Signed)
Blood pressure is okay.  Diastolic is a little bit borderline but will continue with current regimen and keep an eye on this.

## 2019-07-18 NOTE — Assessment & Plan Note (Signed)
Botox injections on hold for now during pregnancy.  Continue Cymbalta.

## 2019-07-18 NOTE — Assessment & Plan Note (Addendum)
For doing well with alternating dose of 30 and 60 mg Cymbalta.  Number have her try to go down to just 30 mg daily.  New prescription sent to pharmacy.  Plan to follow-up in 3 months.  Caution is advised for Cymbalta particularly in the third trimester the risk of fetal harm is very low.  Maybe by the time we get the third trimester if she is doing really well we might be able to even taper the medication off will just have to see.  Again follow-up in 3 months.

## 2019-07-18 NOTE — Assessment & Plan Note (Signed)
To recheck TSH.  Hopefully the adjustment that we made should be enough if not then will make an adjustment to her dose and follow-up in either 6 weeks or 3 months if her levels look great.

## 2019-07-18 NOTE — Progress Notes (Signed)
Established Patient Office Visit  Subjective:  Patient ID: Brenda Sharp, female    DOB: 04-16-1981  Age: 38 y.o. MRN: [id number]  CC: No chief complaint on file.   HPI Brenda Sharp presents for   Follow-up hypothyroidism.  We had actually increased her dose about 6 weeks ago when she came in newly diagnosed with pregnancy.  TSH at that time was 3.1.  Due to recheck her levels.  She has been taking an extra half of a tab 2 days/week.  We are also following up for her major depression and chronic pain.  She was previously on Cymbalta 60 mg daily so we have tried decreasing her dose to alternating between 60 and 30.  She says so far she is actually been tolerating that well and has had no problems or shift in her mood or pain levels.  She can no longer get Botox for the torticollis, while she is pregnant.  She is taking her prenatal vitamin and staying active.  Hypertension- Pt denies chest pain, SOB, dizziness, or heart palpitations.  Taking meds as directed w/o problems.  Denies medication side effects.      Past Medical History:  Diagnosis Date  . Gout   . Headache(784.0)   . Hypertension   . Hypothyroidism   . Kidney stone   . Miscarriage   . PCOS (polycystic ovarian syndrome)   . 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 Torticollis    treated with botox in 2016    Past Surgical History:  Procedure Laterality Date  . LITHOTRIPSY    . OTHER SURGICAL HISTORY     done a procedure to rid of arthritis of the right big toe per patient   . OTHER SURGICAL HISTORY Left 2017   gout related-tophi per patient   . TONSILLECTOMY  1991  . WISDOM TOOTH EXTRACTION  2006    Family History  Problem Relation Age of Onset  . Hypertension Father   . Gout Father   . Depression Mother   . Lung cancer Paternal Grandfather        smoker  . Heart attack Paternal Grandfather   . Heart attack Paternal Grandmother   . Heart attack Maternal Grandfather   . Colon cancer Neg Hx   . Esophageal cancer Neg  Hx     Social History   Socioeconomic History  . Marital status: Married    Spouse name: Ed  . Number of children: 0  . Years of education: college  . Highest education level: Not on file  Occupational History  . 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 Smoking status: Never Smoker  . Smokeless tobacco: Never Used  Substance and Sexual Activity  . Alcohol use: No    Alcohol/week: 0.0 standard drinks  . Drug use: No  . Sexual activity: Yes    Partners: Male  Other Topics Concern  . Not on file  Social History Narrative   1 cup caffeine/day   Right-handed.   Lives at home with husband.   Social Determinants of Health   Financial Resource Strain:   . Difficulty of Paying Living Expenses: Not on file  Food Insecurity:   . Worried About Charity fundraiser in the Last Year: Not on file  . Ran Out of Food in the Last Year: Not on file  Transportation Needs:   . Lack of Transportation (Medical): Not on file  . Lack of Transportation (Non-Medical):  Not on file  Physical Activity:   . Days of Exercise per Week: Not on file  . Minutes of Exercise per Session: Not on file  Stress:   . Feeling of Stress : Not on file  Social Connections:   . Frequency of Communication with Friends and Family: Not on file  . Frequency of Social Gatherings with Friends and Family: Not on file  . Attends Religious Services: Not on file  . Active Member of Clubs or Organizations: Not on file  . Attends Archivist Meetings: Not on file  . Marital Status: Not on file  Intimate Partner Violence:   . Fear of Current or Ex-Partner: Not on file  . Emotionally Abused: Not on file  . Physically Abused: Not on file  . Sexually Abused: Not on file    Outpatient Medications Prior to Visit  Medication Sig Dispense Refill  . febuxostat (ULORIC) 40 MG tablet Take 1 tablet (40 mg total) by mouth daily. 90 tablet 0  . labetalol (NORMODYNE) 300 MG tablet TAKE 1 TABLET(300 MG)  BY MOUTH TWICE DAILY 180 tablet 1  . levothyroxine (SYNTHROID, LEVOTHROID) 112 MCG tablet TAKE 1 TABLET(112 MCG) BY MOUTH DAILY 90 tablet 3  . metFORMIN (GLUCOPHAGE-XR) 500 MG 24 hr tablet Take 2 tablets (1,000 mg total) by mouth daily with breakfast. 180 tablet 3  . NIFEdipine (PROCARDIA-XL/ADALAT-CC/NIFEDICAL-XL) 30 MG 24 hr tablet TAKE 1 TABLET(30 MG) BY MOUTH DAILY 90 tablet 3  . DULoxetine (CYMBALTA) 30 MG capsule Take 1 capsule (30 mg total) by mouth daily. 30 capsule 0   No facility-administered medications prior to visit.    Allergies  Allergen Reactions  . Imitrex [Sumatriptan]     Chest pain  . Januvia [Sitagliptin] Other (See Comments)    Nausea, GI upset  . Penicillins     Other reaction(s): Other migraines  . Victoza [Liraglutide] Nausea Only  . Colchicine Nausea Only  . Trulicity [Dulaglutide] Nausea Only    ROS Review of Systems    Objective:    Physical Exam  BP 138/89   Pulse (!) 101   Ht 5\' 3"  (1.6 m)   Wt 263 lb (119.3 kg)   LMP 03/10/2019 (Approximate)   SpO2 98%   BMI 46.59 kg/m  Wt Readings from Last 3 Encounters:  07/18/19 263 lb (119.3 kg)  05/30/19 261 lb (118.4 kg)  08/30/18 254 lb (115.2 kg)     Health Maintenance Due  Topic Date Due  . FOOT EXAM  11/10/2018  . URINE MICROALBUMIN  07/04/2019    There are no preventive care reminders to display for this patient.  Lab Results  Component Value Date   TSH 3.10 05/30/2019   Lab Results  Component Value Date   WBC 7.0 07/03/2018   HGB 14.3 07/03/2018   HCT 42.2 07/03/2018   MCV 89.0 07/03/2018   PLT 377 07/03/2018   Lab Results  Component Value Date   NA 143 07/03/2018   K 4.6 07/03/2018   CO2 27 07/03/2018   GLUCOSE 121 (H) 07/03/2018   BUN 12 07/03/2018   CREATININE 0.91 07/03/2018   BILITOT 0.5 07/03/2018   ALKPHOS 73 04/28/2016   AST 24 07/03/2018   ALT 27 07/03/2018   PROT 6.4 07/03/2018   ALBUMIN 4.6 04/28/2016   CALCIUM 9.7 07/03/2018   Lab Results   Component Value Date   CHOL 237 (H) 02/25/2018   Lab Results  Component Value Date   HDL 38 (L) 02/25/2018  Lab Results  Component Value Date   LDLCALC 153 (H) 02/25/2018   Lab Results  Component Value Date   TRIG 278 (H) 02/25/2018   Lab Results  Component Value Date   CHOLHDL 6.2 (H) 02/25/2018   Lab Results  Component Value Date   HGBA1C 6.0 (A) 05/30/2019      Assessment & Plan:   Problem List Items Addressed This Visit      Cardiovascular and Mediastinum   Essential hypertension, benign    Blood pressure is okay.  Diastolic is a little bit borderline but will continue with current regimen and keep an eye on this.        Endocrine   Hypothyroidism - Primary    To recheck TSH.  Hopefully the adjustment that we made should be enough if not then will make an adjustment to her dose and follow-up in either 6 weeks or 3 months if her levels look great.      Relevant Orders   TSH     Nervous and Auditory   Spasmodic torticollis    Botox injections on hold for now during pregnancy.  Continue Cymbalta.        Other   Recurrent major depressive disorder, in partial remission ([place])    For doing well with alternating dose of 30 and 60 mg Cymbalta.  Number have her try to go down to just 30 mg daily.  New prescription sent to pharmacy.  Plan to follow-up in 3 months.  Caution is advised for Cymbalta particularly in the third trimester the risk of fetal harm is very low.  Maybe by the time we get the third trimester if she is doing really well we might be able to even taper the medication off will just have to see.  Again follow-up in 3 months.      Relevant Medications   DULoxetine (CYMBALTA) 30 MG capsule      Meds ordered this encounter  Medications  . DULoxetine (CYMBALTA) 30 MG capsule    Sig: Take 1 capsule (30 mg total) by mouth daily.    Dispense:  90 capsule    Refill:  1    Follow-up: Return in about 3 months (around 10/16/2019) for thyroid .     Beatrice Lecher, MD

## 2019-07-18 NOTE — Progress Notes (Signed)
She reports taking the Duloxetine 30 mg every other day. Doing well on current regimen.Marland Kitchen

## 2019-07-19 LAB — TSH: TSH: 1.73 mIU/L

## 2019-07-30 ENCOUNTER — Encounter: Payer: BC Managed Care – PPO | Attending: Family Medicine | Admitting: Registered"

## 2019-07-30 ENCOUNTER — Other Ambulatory Visit: Payer: Self-pay

## 2019-07-30 DIAGNOSIS — O99810 Abnormal glucose complicating pregnancy: Secondary | ICD-10-CM | POA: Insufficient documentation

## 2019-07-31 ENCOUNTER — Encounter: Payer: Self-pay | Admitting: Registered"

## 2019-07-31 NOTE — Progress Notes (Signed)
Patient was seen on 07/30/2019 for Gestational Diabetes self-management class at the [facility]. The following learning objectives were met by the patient during this course:   States the definition of Gestational Diabetes  States why dietary management is important in controlling blood glucose  Describes the effects each nutrient has on blood glucose levels  Demonstrates ability to create a balanced meal plan  Demonstrates carbohydrate counting   States when to check blood glucose levels  Demonstrates proper blood glucose monitoring techniques  States the effect of stress and exercise on blood glucose levels  States the importance of limiting caffeine and abstaining from alcohol and smoking  Blood glucose monitor given: none  Because patient also has PCOS, RD provided Ovasitol sample: Lot [id number] Exp: 08/2019  Patient instructed to monitor glucose levels: FBS: 60 - <95; 1 hour: <140; 2 hour: <120  Patient received handouts:  Nutrition Diabetes and Pregnancy, including carb counting list  Patient will be seen for follow-up as needed.

## 2019-09-25 ENCOUNTER — Encounter: Payer: Self-pay | Admitting: Family Medicine

## 2019-09-25 DIAGNOSIS — E1165 Type 2 diabetes mellitus with hyperglycemia: Secondary | ICD-10-CM

## 2019-09-25 MED ORDER — FEBUXOSTAT 40 MG PO TABS: 40.0000 mg | ORAL_TABLET | Freq: Every day | ORAL | 1 refills | Status: DC

## 2019-09-25 MED ORDER — NIFEDIPINE ER OSMOTIC RELEASE 30 MG PO TB24: ORAL_TABLET | ORAL | 1 refills | Status: DC

## 2019-09-25 MED ORDER — METFORMIN HCL ER 500 MG PO TB24: 1000.0000 mg | ORAL_TABLET | Freq: Every day | ORAL | 1 refills | Status: DC

## 2019-09-25 MED ORDER — DULOXETINE HCL 30 MG PO CPEP: 30.0000 mg | ORAL_CAPSULE | Freq: Every day | ORAL | 1 refills | Status: DC

## 2019-09-25 MED ORDER — LEVOTHYROXINE SODIUM 112 MCG PO TABS: ORAL_TABLET | ORAL | 1 refills | Status: DC

## 2019-09-25 MED ORDER — LABETALOL HCL 300 MG PO TABS: 300.0000 mg | ORAL_TABLET | Freq: Two times a day (BID) | ORAL | 1 refills | Status: DC

## 2019-10-08 ENCOUNTER — Encounter: Payer: Self-pay | Admitting: Family Medicine

## 2019-10-09 NOTE — Telephone Encounter (Signed)
Please let her know I see infants here in the office and we do peds as well but if she would still prefer a pediatrician let me know.

## 2019-10-16 ENCOUNTER — Ambulatory Visit: Payer: BC Managed Care – PPO | Admitting: Family Medicine

## 2019-11-12 ENCOUNTER — Encounter: Payer: Self-pay | Admitting: Family Medicine

## 2019-11-19 LAB — HM DIABETES EYE EXAM

## 2019-11-26 ENCOUNTER — Encounter: Payer: Self-pay | Admitting: Family Medicine

## 2019-11-26 LAB — OB RESULTS CONSOLE GBS: GBS: NEGATIVE

## 2019-12-08 ENCOUNTER — Encounter (HOSPITAL_COMMUNITY): Payer: Self-pay | Admitting: *Deleted

## 2019-12-08 ENCOUNTER — Telehealth (HOSPITAL_COMMUNITY): Payer: Self-pay | Admitting: *Deleted

## 2019-12-08 NOTE — Telephone Encounter (Signed)
Preadmission screen  

## 2019-12-10 ENCOUNTER — Other Ambulatory Visit: Payer: Self-pay

## 2019-12-10 ENCOUNTER — Encounter (HOSPITAL_COMMUNITY): Payer: Self-pay | Admitting: Obstetrics & Gynecology

## 2019-12-10 ENCOUNTER — Telehealth (HOSPITAL_COMMUNITY): Payer: Self-pay | Admitting: *Deleted

## 2019-12-10 ENCOUNTER — Inpatient Hospital Stay (HOSPITAL_COMMUNITY)
Admission: AD | Admit: 2019-12-10 | Discharge: 2019-12-14 | DRG: 788 | Disposition: A | Discharge status: Home / Self Care | Payer: BC Managed Care – PPO | Source: Ambulatory Visit | Attending: Obstetrics | Admitting: Obstetrics

## 2019-12-10 DIAGNOSIS — F329 Major depressive disorder, single episode, unspecified: Secondary | ICD-10-CM | POA: Diagnosis present

## 2019-12-10 DIAGNOSIS — O114 Pre-existing hypertension with pre-eclampsia, complicating childbirth: Secondary | ICD-10-CM | POA: Diagnosis present

## 2019-12-10 DIAGNOSIS — Z20822 Contact with and (suspected) exposure to covid-19: Secondary | ICD-10-CM | POA: Diagnosis present

## 2019-12-10 DIAGNOSIS — O99284 Endocrine, nutritional and metabolic diseases complicating childbirth: Secondary | ICD-10-CM | POA: Diagnosis present

## 2019-12-10 DIAGNOSIS — E039 Hypothyroidism, unspecified: Secondary | ICD-10-CM | POA: Diagnosis present

## 2019-12-10 DIAGNOSIS — Z8249 Family history of ischemic heart disease and other diseases of the circulatory system: Secondary | ICD-10-CM | POA: Diagnosis not present

## 2019-12-10 DIAGNOSIS — O1204 Gestational edema, complicating childbirth: Secondary | ICD-10-CM | POA: Diagnosis present

## 2019-12-10 DIAGNOSIS — M109 Gout, unspecified: Secondary | ICD-10-CM | POA: Diagnosis present

## 2019-12-10 DIAGNOSIS — O24425 Gestational diabetes mellitus in childbirth, controlled by oral hypoglycemic drugs: Secondary | ICD-10-CM | POA: Diagnosis present

## 2019-12-10 DIAGNOSIS — O3663X0 Maternal care for excessive fetal growth, third trimester, not applicable or unspecified: Secondary | ICD-10-CM | POA: Diagnosis present

## 2019-12-10 DIAGNOSIS — O99344 Other mental disorders complicating childbirth: Secondary | ICD-10-CM | POA: Diagnosis present

## 2019-12-10 DIAGNOSIS — O99891 Other specified diseases and conditions complicating pregnancy: Secondary | ICD-10-CM | POA: Diagnosis present

## 2019-12-10 DIAGNOSIS — Z87442 Personal history of urinary calculi: Secondary | ICD-10-CM

## 2019-12-10 DIAGNOSIS — Z3A37 37 weeks gestation of pregnancy: Secondary | ICD-10-CM | POA: Diagnosis not present

## 2019-12-10 DIAGNOSIS — O99214 Obesity complicating childbirth: Secondary | ICD-10-CM | POA: Diagnosis present

## 2019-12-10 DIAGNOSIS — O119 Pre-existing hypertension with pre-eclampsia, unspecified trimester: Secondary | ICD-10-CM | POA: Diagnosis present

## 2019-12-10 DIAGNOSIS — E282 Polycystic ovarian syndrome: Secondary | ICD-10-CM | POA: Diagnosis present

## 2019-12-10 DIAGNOSIS — M436 Torticollis: Secondary | ICD-10-CM | POA: Diagnosis present

## 2019-12-10 DIAGNOSIS — O10913 Unspecified pre-existing hypertension complicating pregnancy, third trimester: Secondary | ICD-10-CM | POA: Diagnosis not present

## 2019-12-10 LAB — URINALYSIS, ROUTINE W REFLEX MICROSCOPIC
Bilirubin Urine: NEGATIVE
Glucose, UA: NEGATIVE mg/dL
Hgb urine dipstick: NEGATIVE
Ketones, ur: NEGATIVE mg/dL
Nitrite: NEGATIVE
Protein, ur: 30 mg/dL — AB
Specific Gravity, Urine: 1.018 (ref 1.005–1.030)
pH: 5 (ref 5.0–8.0)

## 2019-12-10 LAB — CBC
HCT: 40.9 % (ref 36.0–46.0)
Hemoglobin: 13.3 g/dL (ref 12.0–15.0)
MCH: 29.8 pg (ref 26.0–34.0)
MCHC: 32.5 g/dL (ref 30.0–36.0)
MCV: 91.5 fL (ref 80.0–100.0)
Platelets: 331 10*3/uL (ref 150–400)
RBC: 4.47 MIL/uL (ref 3.87–5.11)
RDW: 13.4 % (ref 11.5–15.5)
WBC: 9.2 10*3/uL (ref 4.0–10.5)
nRBC: 0 % (ref 0.0–0.2)

## 2019-12-10 LAB — GLUCOSE, CAPILLARY
Glucose-Capillary: 95 mg/dL (ref 70–99)
Glucose-Capillary: 112 mg/dL — ABNORMAL HIGH (ref 70–99)

## 2019-12-10 LAB — COMPREHENSIVE METABOLIC PANEL
ALT: 12 U/L (ref 0–44)
AST: 14 U/L — ABNORMAL LOW (ref 15–41)
Albumin: 2.6 g/dL — ABNORMAL LOW (ref 3.5–5.0)
Alkaline Phosphatase: 129 U/L — ABNORMAL HIGH (ref 38–126)
Anion gap: 10 (ref 5–15)
BUN: 12 mg/dL (ref 6–20)
CO2: 23 mmol/L (ref 22–32)
Calcium: 9.7 mg/dL (ref 8.9–10.3)
Chloride: 104 mmol/L (ref 98–111)
Creatinine, Ser: 1.03 mg/dL — ABNORMAL HIGH (ref 0.44–1.00)
GFR calc Af Amer: >60 mL/min (ref >60)
GFR calc non Af Amer: >60 mL/min (ref >60)
Glucose, Bld: 93 mg/dL (ref 70–99)
Potassium: 5.2 mmol/L — ABNORMAL HIGH (ref 3.5–5.1)
Sodium: 137 mmol/L (ref 135–145)
Total Bilirubin: 0.5 mg/dL (ref 0.3–1.2)
Total Protein: 5.9 g/dL — ABNORMAL LOW (ref 6.5–8.1)

## 2019-12-10 LAB — RESPIRATORY PANEL BY RT PCR (FLU A&B, COVID)
Influenza A by PCR: NEGATIVE
Influenza B by PCR: NEGATIVE
SARS Coronavirus 2 by RT PCR: NEGATIVE

## 2019-12-10 LAB — TYPE AND SCREEN
ABO/RH(D): O POS
Antibody Screen: NEGATIVE

## 2019-12-10 LAB — PROTEIN / CREATININE RATIO, URINE
Creatinine, Urine: 204.59 mg/dL
Protein Creatinine Ratio: 0.09 mg/mg{Cre} (ref 0.00–0.15)
Total Protein, Urine: 18 mg/dL

## 2019-12-10 LAB — ABO/RH: ABO/RH(D): O POS

## 2019-12-10 MED ORDER — OXYCODONE-ACETAMINOPHEN 5-325 MG PO TABS: 2.0000 | ORAL_TABLET | ORAL | Status: DC | PRN

## 2019-12-10 MED ORDER — OXYTOCIN 40 UNITS IN NORMAL SALINE INFUSION - SIMPLE MED: 2.5000 [IU]/h | INTRAVENOUS | Status: DC

## 2019-12-10 MED ORDER — ACETAMINOPHEN 325 MG PO TABS: 650.0000 mg | ORAL_TABLET | ORAL | Status: DC | PRN

## 2019-12-10 MED ORDER — LABETALOL HCL 5 MG/ML IV SOLN: 80.0000 mg | INTRAVENOUS | Status: DC | PRN

## 2019-12-10 MED ORDER — FEBUXOSTAT 40 MG PO TABS
40.0000 mg | ORAL_TABLET | Freq: Every day | ORAL | Status: DC
  Administered 2019-12-10: 22:00:00 40 mg via ORAL
  Filled 2019-12-10 (×3): qty 1

## 2019-12-10 MED ORDER — OXYTOCIN BOLUS FROM INFUSION: 500.0000 mL | Freq: Once | INTRAVENOUS | Status: DC

## 2019-12-10 MED ORDER — LABETALOL HCL 200 MG PO TABS
200.0000 mg | ORAL_TABLET | Freq: Three times a day (TID) | ORAL | Status: DC
  Administered 2019-12-11: 200 mg via ORAL
  Filled 2019-12-10: qty 1

## 2019-12-10 MED ORDER — METFORMIN HCL ER 500 MG PO TB24
1000.0000 mg | ORAL_TABLET | Freq: Every day | ORAL | Status: DC
  Administered 2019-12-10: 22:00:00 1000 mg via ORAL
  Filled 2019-12-10 (×2): qty 2

## 2019-12-10 MED ORDER — MISOPROSTOL 50MCG HALF TABLET
ORAL_TABLET | ORAL | Status: AC
  Filled 2019-12-10: qty 1

## 2019-12-10 MED ORDER — MISOPROSTOL 25 MCG QUARTER TABLET
25.0000 ug | ORAL_TABLET | ORAL | Status: DC | PRN
  Administered 2019-12-10: 15:00:00 25 ug via VAGINAL
  Filled 2019-12-10 (×2): qty 1

## 2019-12-10 MED ORDER — LABETALOL HCL 5 MG/ML IV SOLN
20.0000 mg | INTRAVENOUS | Status: DC | PRN
  Administered 2019-12-10 – 2019-12-11 (×2): 20 mg via INTRAVENOUS
  Filled 2019-12-10 (×2): qty 4

## 2019-12-10 MED ORDER — MISOPROSTOL 50MCG HALF TABLET
50.0000 ug | ORAL_TABLET | ORAL | Status: DC | PRN
  Administered 2019-12-10 – 2019-12-11 (×2): 50 ug via ORAL
  Filled 2019-12-10: qty 1

## 2019-12-10 MED ORDER — LIDOCAINE HCL (PF) 1 % IJ SOLN: 30.0000 mL | INTRAMUSCULAR | Status: DC | PRN

## 2019-12-10 MED ORDER — SOD CITRATE-CITRIC ACID 500-334 MG/5ML PO SOLN
30.0000 mL | ORAL | Status: DC | PRN
  Administered 2019-12-11: 30 mL via ORAL
  Filled 2019-12-10: qty 30

## 2019-12-10 MED ORDER — LABETALOL HCL 5 MG/ML IV SOLN
40.0000 mg | INTRAVENOUS | Status: DC | PRN
  Administered 2019-12-10 – 2019-12-11 (×2): 40 mg via INTRAVENOUS
  Filled 2019-12-10 (×2): qty 8

## 2019-12-10 MED ORDER — LACTATED RINGERS IV SOLN: 500.0000 mL | INTRAVENOUS | Status: DC | PRN

## 2019-12-10 MED ORDER — LABETALOL HCL 100 MG PO TABS
200.0000 mg | ORAL_TABLET | Freq: Once | ORAL | Status: AC
  Administered 2019-12-10: 13:00:00 200 mg via ORAL
  Filled 2019-12-10: qty 2

## 2019-12-10 MED ORDER — LEVOTHYROXINE SODIUM 112 MCG PO TABS
112.0000 ug | ORAL_TABLET | Freq: Every day | ORAL | Status: DC
  Administered 2019-12-11: 08:00:00 112 ug via ORAL
  Filled 2019-12-10 (×3): qty 1

## 2019-12-10 MED ORDER — LACTATED RINGERS IV SOLN: INTRAVENOUS | Status: DC

## 2019-12-10 MED ORDER — TERBUTALINE SULFATE 1 MG/ML IJ SOLN: 0.2500 mg | Freq: Once | INTRAMUSCULAR | Status: DC | PRN

## 2019-12-10 MED ORDER — NIFEDIPINE ER OSMOTIC RELEASE 30 MG PO TB24
30.0000 mg | ORAL_TABLET | Freq: Every day | ORAL | Status: DC
  Administered 2019-12-10: 22:00:00 30 mg via ORAL
  Filled 2019-12-10 (×3): qty 1

## 2019-12-10 MED ORDER — DULOXETINE HCL 30 MG PO CPEP: 30.0000 mg | ORAL_CAPSULE | Freq: Every day | ORAL | Status: DC

## 2019-12-10 MED ORDER — DULOXETINE HCL 30 MG PO CPEP
30.0000 mg | ORAL_CAPSULE | Freq: Every day | ORAL | Status: DC
  Administered 2019-12-10 – 2019-12-11 (×2): 30 mg via ORAL
  Filled 2019-12-10 (×2): qty 1

## 2019-12-10 MED ORDER — HYDRALAZINE HCL 20 MG/ML IJ SOLN: 10.0000 mg | INTRAMUSCULAR | Status: DC | PRN

## 2019-12-10 MED ORDER — FLEET ENEMA 7-19 GM/118ML RE ENEM: 1.0000 | ENEMA | RECTAL | Status: DC | PRN

## 2019-12-10 MED ORDER — OXYCODONE-ACETAMINOPHEN 5-325 MG PO TABS: 1.0000 | ORAL_TABLET | ORAL | Status: DC | PRN

## 2019-12-10 MED ORDER — ONDANSETRON HCL 4 MG/2ML IJ SOLN: 4.0000 mg | Freq: Four times a day (QID) | INTRAMUSCULAR | Status: DC | PRN

## 2019-12-10 NOTE — Telephone Encounter (Signed)
Preadmission screen  

## 2019-12-10 NOTE — MAU Provider Note (Signed)
Chief Complaint  Patient presents with  . BP Evaluation     First Provider Initiated Contact with Patient 12/10/19 1247      S: Brenda Sharp  is a 39 y.o. y.o. year old [id number] female at [redacted]w[redacted]d weeks gestation who presents to MAU with elevated blood pressures. Hx of chronic hypertension. Current blood pressure medication: labetalol 200 mg TID, procardia 30 xl daily. Missed dose this morning. Was seen at the office this morning & had a severe range blood pressure.   Associated symptoms: denies Headache, denies vision changes, denies epigastric pain Contractions: denies Vaginal bleeding: denies Fetal movement: good  O:  Patient Vitals for the past 24 hrs:  BP Temp Temp src Pulse Resp SpO2 Height Weight  12/10/19 1401 128/84 -- -- 71 -- 97 % -- --  12/10/19 1352 122/74 -- -- 73 -- -- -- --  12/10/19 1351 -- -- -- -- -- 96 % -- --  12/10/19 1350 120/77 -- -- 76 -- -- -- --  12/10/19 1346 -- -- -- -- -- 98 % -- --  12/10/19 1341 -- -- -- -- -- 96 % -- --  12/10/19 1336 -- -- -- -- -- 97 % -- --  12/10/19 1331 (!) 160/99 -- -- 78 -- 97 % -- --  12/10/19 1326 -- -- -- -- -- 97 % -- --  12/10/19 1321 -- -- -- -- -- 97 % -- --  12/10/19 1317 (!) 152/90 -- -- 86 -- -- -- --  12/10/19 1316 -- -- -- -- -- 98 % -- --  12/10/19 1300 (!) 164/103 -- -- -- -- -- -- --  12/10/19 1247 (!) 164/103 -- -- 87 -- -- -- --  12/10/19 1246 -- -- -- -- -- 97 % -- --  12/10/19 1241 -- -- -- -- -- 97 % -- --  12/10/19 1236 -- -- -- -- -- 97 % -- --  12/10/19 1232 (!) 155/98 -- -- 86 -- -- -- --  12/10/19 1231 -- -- -- -- -- 97 % -- --  12/10/19 1205 (!) 157/99 98.8 F (37.1 C) Oral 80 18 98 % 5\' 3"  (1.6 m) 127.3 kg   General: NAD Heart: Regular rate Lungs: Normal rate and effort Abd: Soft, NT, Gravid, S=D Extremities: non pitting Pedal edema Neuro: 2+ deep tendon reflexes, No clonus  NST:  Baseline: 145 bpm, Variability: Good {> 6 bpm), Accelerations: Reactive and Decelerations: Variable:  mild  Results for orders placed or performed during the hospital encounter of 12/10/19 (from the past 24 hour(s))  CBC     Status: None   Collection Time: 12/10/19 12:17 PM  Result Value Ref Range   WBC 9.2 4.0 - 10.5 K/uL   RBC 4.47 3.87 - 5.11 MIL/uL   Hemoglobin 13.3 12.0 - 15.0 g/dL   HCT 40.9 36.0 - 46.0 %   MCV 91.5 80.0 - 100.0 fL   MCH 29.8 26.0 - 34.0 pg   MCHC 32.5 30.0 - 36.0 g/dL   RDW 13.4 11.5 - 15.5 %   Platelets 331 150 - 400 K/uL   nRBC 0.0 0.0 - 0.2 %  Comprehensive metabolic panel     Status: Abnormal   Collection Time: 12/10/19 12:17 PM  Result Value Ref Range   Sodium 137 135 - 145 mmol/L   Potassium 5.2 (H) 3.5 - 5.1 mmol/L   Chloride 104 98 - 111 mmol/L   CO2 23 22 - 32 mmol/L   Glucose, Bld 93 70 -  99 mg/dL   BUN 12 6 - 20 mg/dL   Creatinine, Ser 1.03 (H) 0.44 - 1.00 mg/dL   Calcium 9.7 8.9 - 10.3 mg/dL   Total Protein 5.9 (L) 6.5 - 8.1 g/dL   Albumin 2.6 (L) 3.5 - 5.0 g/dL   AST 14 (L) 15 - 41 U/L   ALT 12 0 - 44 U/L   Alkaline Phosphatase 129 (H) 38 - 126 U/L   Total Bilirubin 0.5 0.3 - 1.2 mg/dL   GFR calc non Af Amer >60 >60 mL/min   GFR calc Af Amer >60 >60 mL/min   Anion gap 10 5 - 15  Protein / creatinine ratio, urine     Status: None   Collection Time: 12/10/19 12:26 PM  Result Value Ref Range   Creatinine, Urine 204.59 mg/dL   Total Protein, Urine 18 mg/dL   Protein Creatinine Ratio 0.09 0.00 - 0.15 mg/mg[Cre]  Urinalysis, Routine w reflex microscopic     Status: Abnormal   Collection Time: 12/10/19 12:26 PM  Result Value Ref Range   Color, Urine YELLOW YELLOW   APPearance HAZY (A) CLEAR   Specific Gravity, Urine 1.018 1.005 - 1.030   pH 5.0 5.0 - 8.0   Glucose, UA NEGATIVE NEGATIVE mg/dL   Hgb urine dipstick NEGATIVE NEGATIVE   Bilirubin Urine NEGATIVE NEGATIVE   Ketones, ur NEGATIVE NEGATIVE mg/dL   Protein, ur 30 (A) NEGATIVE mg/dL   Nitrite NEGATIVE NEGATIVE   Leukocytes,Ua TRACE (A) NEGATIVE   RBC / HPF 6-10 0 - 5 RBC/hpf    WBC, UA 6-10 0 - 5 WBC/hpf   Bacteria, UA RARE (A) NONE SEEN   Squamous Epithelial / LPF 11-20 0 - 5   Mucus PRESENT     MDM Severe range BPs x 3, treated with 2 doses of IV labetalol (20, 40).  Patient asymptomatic.   Normal protein creatinine ratio. Serum creatinine up to 1.03; was 0.66 back in December.   Consulted with Dr. Rip Harbour who recommends delivery. Dr. Benjie Karvonen agrees with this plan.   A:  1. Chronic hypertension complicating or reason for care during pregnancy, third trimester   2. [redacted] weeks gestation of pregnancy     P:  Admit to birthing suites   Jorje Guild, NP 12/10/2019 2:29 PM

## 2019-12-10 NOTE — MAU Note (Signed)
Pt sent from MD office for BP evaluation.  Denies H/A, visual disturbances, or epigastric pain.  Endorses +FM.  Denies VB or LOF.

## 2019-12-10 NOTE — H&P (Signed)
Brenda Sharp is a 39 y.o. female presenting for IOL at 63 wks for severe range BPs with chronic HTN on meds. EDC 12/31/19 Pt is G3P0020, 37 wks, send from office after routine visit, NST,BPP due to BP 160/111 though patient denied symptoms and thought BP up due to neck pain from torticollis since unable to take Botox injections in pregnancy. Denies HA/ RUQ pain/ swelling/ vision scotomas.  Reports normal FMs but NST was not reactive in office, BPP 8/8 and AFI normal, Vx today.   PCOS, long infertility with spontaneous pregnancy this time. 2 SABs in past.  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  -Abn Panorama for T13/18 but due to high BMI - [facility] NIPS performed, that was normal, XX, NT sono and anatomy nl. -Early Glucola at 17 wks- 248, so started metformin 1000mg  at night.  -CHTN- Procardia 30mg  XL and Labetalol increased over time from 100mg  BID to 200mg  TID -Hypothyroid- on Synthroid, TFTs ea trim, nl -Depression stable on Duloxetine Growth sono q4wkly from 24 wks. 42% at 28 wks, 69% at 32 wks and EFW 7'4" 90% at 36 wks with AC at 99% and turned VERTEX- though reported home BS in nl range for F and PP improved to be <120-130s after stopping starch at dinner. Wkly ANtesting from 32 wks- BPP 8/8, AFI high nl, NST reactive, except today.   - BTMZ at 35, 35,1 weeks (4/21, 4/22) due to BP getting worse, ranging 150s/ 90s, so Labetalol increased to 200mg  TID.  - several times [place] labs done with normal labs and nl urine P/C ratio  OB History    Gravida  3   Para      Term      Preterm      AB  2   Living        SAB  2   TAB      Ectopic      Multiple      Live Births             Past Medical History:  Diagnosis Date  . Gout   . Headache(784.0)   . Hypertension   . Hypothyroidism   . Kidney stone   . Miscarriage    . PCOS (polycystic ovarian syndrome)   . 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 Torticollis    treated with botox in 2016   Past Surgical History:  Procedure Laterality Date  . LITHOTRIPSY    . OTHER SURGICAL HISTORY     done a procedure to rid of arthritis of the right big toe per patient   . OTHER SURGICAL HISTORY Left 2017   gout related-tophi per patient   . TONSILLECTOMY  1991  . tophi removal  08/2016   left knee  . tophi removal  07/2016   right great toe  . WISDOM TOOTH EXTRACTION  2006   Family History: family history includes Depression in her mother; Gout in her father; Heart attack in her maternal grandfather, paternal grandfather, and paternal grandmother; Hypertension in her father; Lung cancer in her paternal grandfather. Social History:  reports that she has never smoked. She has never used smokeless tobacco. She reports that she  does not drink alcohol or use drugs.     Maternal Diabetes: Yes:  Diabetes Type:  Insulin/Medication controlled - Metformin 1000mg  PO at night Genetic Screening: Normal. Panorama abnormal  T13/18 but due to high BMI, [facility] NIPS was done and came back nl, XX Maternal Ultrasounds/Referrals: Normal and Other: LGA at 36 wks Fetal Ultrasounds or other Referrals:  None Maternal Substance Abuse:  No Significant Maternal Medications:  Meds include: Other: Metformin 1000 mg XL, Synthroid, Procardia 30mg  XL, Labetalol increased to 200mg  TID, Duloxetine   Significant Maternal Lab Results:  Group B Strep negative Other Comments:  None  Review of Systems History Dilation: Closed Effacement (%): Thick Station: Ballotable Exam by:: Mary Martinique Johnson, RN  Blood pressure 123/87, pulse 84, temperature 98.4 F (36.9 C), temperature source Oral, resp. rate 16, height 5\' 3"  (1.6 m), weight 127.3 kg, SpO2 98 %. Exam Physical Exam  A&O x 3, no acute distress. Pleasant HEENT neg, no thyromegaly Lungs CTA bilat CV RRR, S1S2 normal Abdo soft, non tender, non  acute Extr no edema/ tenderness Pelvic Closed, long, thick cx. Score 0. Stn high. But Vx by sono in office  Pelvis borderline  FHT  150s + accels no decels mod variab- cat I Toco irreg   Prenatal labs: ABO, Rh: --/--/O POS, O POS Performed at [facility], [place] [address], [place], [place] [address]  [phone] 1424) Antibody: NEG (05/05 1424) Rubella: Immune (11/02 0000) RPR: Nonreactive (11/02 0000)  HBsAg: Negative (11/02 0000)  HIV: Non-reactive (11/02 0000)  GBS: Negative/-- (04/21 0000)  1hr Glucola 248 at 17 wks  Panorama- abn but [facility] NIPS - normal (this was repeated since NT sono was normal and she has high BMI)  Assessment/Plan: 39 yo G3P0020, at 37 wks for IOL for worsening BPs in severe range in woman with [facility] on meds. Sp IV Labetalol per protocol x 2 doses in MAU.  -IOL with Cytotec q 4 hrs x 2 doses, reassess if cervical foley candidate after. GBS(-). FHT cat I. EFW 7.1/2- 8 lbs based on 36 wk sono (7'4"). Pelvis borderline with increased C/s risk.  -CHTN, urine P/c nl and no preeclampsia, no neural symptoms and BPs stable/ improved since arriving to L&D. So defer magnesium now. Use protocol prn, continue home meds - Procardia 30mg  XL at night and Labetalol 200mg  q8hrs -A2GDM- BS q 4 hrs. Metformin 1000mg  XL at night. LGA, risk of shoulder dystocia. -Hypothyroid- cont AM Synthroid  -Depression- stable,  Duloxetine - Gout- no meds -Torticollis- in pain now. Will get aggressive with NSAIDs post-delivery and can go back for Botox if not breast feeding  Reviewed plan with patient, expect long IOL with risk for C/section.   Elveria Royals 12/10/2019, 4:45 PM

## 2019-12-11 ENCOUNTER — Inpatient Hospital Stay (HOSPITAL_COMMUNITY): Payer: BC Managed Care – PPO | Admitting: Anesthesiology

## 2019-12-11 ENCOUNTER — Encounter (HOSPITAL_COMMUNITY): Payer: Self-pay | Admitting: Obstetrics & Gynecology

## 2019-12-11 ENCOUNTER — Encounter (HOSPITAL_COMMUNITY)
Admission: AD | Disposition: A | Discharge status: Home / Self Care | Payer: Self-pay | Source: Ambulatory Visit | Attending: Obstetrics

## 2019-12-11 ENCOUNTER — Encounter: Payer: Self-pay | Admitting: Family Medicine

## 2019-12-11 LAB — CBC
HCT: 39.6 % (ref 36.0–46.0)
HCT: 36.8 % (ref 36.0–46.0)
Hemoglobin: 13 g/dL (ref 12.0–15.0)
Hemoglobin: 11.9 g/dL — ABNORMAL LOW (ref 12.0–15.0)
MCH: 30.3 pg (ref 26.0–34.0)
MCH: 29.9 pg (ref 26.0–34.0)
MCHC: 32.8 g/dL (ref 30.0–36.0)
MCHC: 32.3 g/dL (ref 30.0–36.0)
MCV: 92.3 fL (ref 80.0–100.0)
MCV: 92.5 fL (ref 80.0–100.0)
Platelets: 249 10*3/uL (ref 150–400)
Platelets: 254 10*3/uL (ref 150–400)
RBC: 4.29 MIL/uL (ref 3.87–5.11)
RBC: 3.98 MIL/uL (ref 3.87–5.11)
RDW: 13.4 % (ref 11.5–15.5)
RDW: 13.5 % (ref 11.5–15.5)
WBC: 9.7 10*3/uL (ref 4.0–10.5)
WBC: 10.6 10*3/uL — ABNORMAL HIGH (ref 4.0–10.5)
nRBC: 0 % (ref 0.0–0.2)
nRBC: 0 % (ref 0.0–0.2)

## 2019-12-11 LAB — COMPREHENSIVE METABOLIC PANEL
ALT: 10 U/L (ref 0–44)
AST: 12 U/L — ABNORMAL LOW (ref 15–41)
Albumin: 2.4 g/dL — ABNORMAL LOW (ref 3.5–5.0)
Alkaline Phosphatase: 114 U/L (ref 38–126)
Anion gap: 8 (ref 5–15)
BUN: 16 mg/dL (ref 6–20)
CO2: 22 mmol/L (ref 22–32)
Calcium: 9 mg/dL (ref 8.9–10.3)
Chloride: 107 mmol/L (ref 98–111)
Creatinine, Ser: 1.09 mg/dL — ABNORMAL HIGH (ref 0.44–1.00)
GFR calc Af Amer: >60 mL/min (ref >60)
GFR calc non Af Amer: >60 mL/min (ref >60)
Glucose, Bld: 108 mg/dL — ABNORMAL HIGH (ref 70–99)
Potassium: 4.6 mmol/L (ref 3.5–5.1)
Sodium: 137 mmol/L (ref 135–145)
Total Bilirubin: 0.4 mg/dL (ref 0.3–1.2)
Total Protein: 5.3 g/dL — ABNORMAL LOW (ref 6.5–8.1)

## 2019-12-11 LAB — GLUCOSE, CAPILLARY
Glucose-Capillary: 82 mg/dL (ref 70–99)
Glucose-Capillary: 99 mg/dL (ref 70–99)
Glucose-Capillary: 91 mg/dL (ref 70–99)
Glucose-Capillary: 166 mg/dL — ABNORMAL HIGH (ref 70–99)
Glucose-Capillary: 131 mg/dL — ABNORMAL HIGH (ref 70–99)

## 2019-12-11 LAB — CREATININE, SERUM
Creatinine, Ser: 1.05 mg/dL — ABNORMAL HIGH (ref 0.44–1.00)
GFR calc Af Amer: >60 mL/min (ref >60)
GFR calc non Af Amer: >60 mL/min (ref >60)

## 2019-12-11 SURGERY — Surgical Case
Anesthesia: Spinal

## 2019-12-11 MED ORDER — ACETAMINOPHEN 500 MG PO TABS
1000.0000 mg | ORAL_TABLET | Freq: Four times a day (QID) | ORAL | Status: DC
  Administered 2019-12-11 – 2019-12-12 (×3): 1000 mg via ORAL
  Filled 2019-12-11 (×3): qty 2

## 2019-12-11 MED ORDER — OXYTOCIN 40 UNITS IN NORMAL SALINE INFUSION - SIMPLE MED
INTRAVENOUS | Status: AC
  Filled 2019-12-11: qty 1000

## 2019-12-11 MED ORDER — MEPERIDINE HCL 25 MG/ML IJ SOLN: 6.2500 mg | INTRAMUSCULAR | Status: DC | PRN

## 2019-12-11 MED ORDER — FENTANYL CITRATE (PF) 100 MCG/2ML IJ SOLN
INTRAMUSCULAR | Status: AC
  Filled 2019-12-11: qty 2

## 2019-12-11 MED ORDER — TETANUS-DIPHTH-ACELL PERTUSSIS 5-2.5-18.5 LF-MCG/0.5 IM SUSP: 0.5000 mL | Freq: Once | INTRAMUSCULAR | Status: DC

## 2019-12-11 MED ORDER — ONDANSETRON HCL 4 MG/2ML IJ SOLN
INTRAMUSCULAR | Status: AC
  Filled 2019-12-11: qty 2

## 2019-12-11 MED ORDER — NALOXONE HCL 4 MG/10ML IJ SOLN
1.0000 ug/kg/h | INTRAVENOUS | Status: DC | PRN
  Filled 2019-12-11: qty 5

## 2019-12-11 MED ORDER — BUPIVACAINE IN DEXTROSE 0.75-8.25 % IT SOLN
INTRATHECAL | Status: DC | PRN
  Administered 2019-12-11: 1.4 mg via INTRATHECAL

## 2019-12-11 MED ORDER — DIBUCAINE (PERIANAL) 1 % EX OINT: 1.0000 "application " | TOPICAL_OINTMENT | CUTANEOUS | Status: DC | PRN

## 2019-12-11 MED ORDER — FEBUXOSTAT 40 MG PO TABS: 40.0000 mg | ORAL_TABLET | Freq: Every day | ORAL | Status: DC

## 2019-12-11 MED ORDER — DULOXETINE HCL 30 MG PO CPEP
30.0000 mg | ORAL_CAPSULE | Freq: Every day | ORAL | Status: DC
  Administered 2019-12-11 – 2019-12-14 (×4): 30 mg via ORAL
  Filled 2019-12-11 (×5): qty 1

## 2019-12-11 MED ORDER — OXYTOCIN 40 UNITS IN NORMAL SALINE INFUSION - SIMPLE MED: 2.5000 [IU]/h | INTRAVENOUS | Status: DC

## 2019-12-11 MED ORDER — DIPHENHYDRAMINE HCL 25 MG PO CAPS: 25.0000 mg | ORAL_CAPSULE | ORAL | Status: DC | PRN

## 2019-12-11 MED ORDER — WITCH HAZEL-GLYCERIN EX PADS: 1.0000 "application " | MEDICATED_PAD | CUTANEOUS | Status: DC | PRN

## 2019-12-11 MED ORDER — SODIUM CHLORIDE 0.9% FLUSH: 3.0000 mL | INTRAVENOUS | Status: DC | PRN

## 2019-12-11 MED ORDER — NALBUPHINE HCL 10 MG/ML IJ SOLN: 5.0000 mg | INTRAMUSCULAR | Status: DC | PRN

## 2019-12-11 MED ORDER — LABETALOL HCL 200 MG PO TABS: 200.0000 mg | ORAL_TABLET | Freq: Two times a day (BID) | ORAL | Status: DC

## 2019-12-11 MED ORDER — ZOLPIDEM TARTRATE 5 MG PO TABS: 5.0000 mg | ORAL_TABLET | Freq: Every evening | ORAL | Status: DC | PRN

## 2019-12-11 MED ORDER — NALOXONE HCL 0.4 MG/ML IJ SOLN: 0.4000 mg | INTRAMUSCULAR | Status: DC | PRN

## 2019-12-11 MED ORDER — NIFEDIPINE ER OSMOTIC RELEASE 30 MG PO TB24
30.0000 mg | ORAL_TABLET | Freq: Every day | ORAL | Status: DC
  Administered 2019-12-11 – 2019-12-12 (×2): 30 mg via ORAL
  Filled 2019-12-11 (×2): qty 1

## 2019-12-11 MED ORDER — NALBUPHINE HCL 10 MG/ML IJ SOLN: 5.0000 mg | Freq: Once | INTRAMUSCULAR | Status: DC | PRN

## 2019-12-11 MED ORDER — FEBUXOSTAT 40 MG PO TABS
40.0000 mg | ORAL_TABLET | Freq: Every day | ORAL | Status: DC
  Administered 2019-12-11 – 2019-12-14 (×4): 40 mg via ORAL
  Filled 2019-12-11 (×4): qty 1

## 2019-12-11 MED ORDER — DEXTROSE 5 % IV SOLN
INTRAVENOUS | Status: DC | PRN
  Administered 2019-12-11: 3 g via INTRAVENOUS

## 2019-12-11 MED ORDER — PHENYLEPHRINE HCL-NACL 20-0.9 MG/250ML-% IV SOLN
INTRAVENOUS | Status: DC | PRN
  Administered 2019-12-11: 60 ug/min via INTRAVENOUS

## 2019-12-11 MED ORDER — PHENYLEPHRINE 40 MCG/ML (10ML) SYRINGE FOR IV PUSH (FOR BLOOD PRESSURE SUPPORT)
PREFILLED_SYRINGE | INTRAVENOUS | Status: AC
  Filled 2019-12-11: qty 10

## 2019-12-11 MED ORDER — DIPHENHYDRAMINE HCL 25 MG PO CAPS: 25.0000 mg | ORAL_CAPSULE | Freq: Four times a day (QID) | ORAL | Status: DC | PRN

## 2019-12-11 MED ORDER — ONDANSETRON HCL 4 MG/2ML IJ SOLN: 4.0000 mg | Freq: Three times a day (TID) | INTRAMUSCULAR | Status: DC | PRN

## 2019-12-11 MED ORDER — KETOROLAC TROMETHAMINE 30 MG/ML IJ SOLN
INTRAMUSCULAR | Status: AC
  Filled 2019-12-11: qty 1

## 2019-12-11 MED ORDER — KETOROLAC TROMETHAMINE 30 MG/ML IJ SOLN
30.0000 mg | Freq: Four times a day (QID) | INTRAMUSCULAR | Status: AC | PRN
  Administered 2019-12-11: 19:00:00 30 mg via INTRAMUSCULAR

## 2019-12-11 MED ORDER — OXYTOCIN 40 UNITS IN NORMAL SALINE INFUSION - SIMPLE MED
INTRAVENOUS | Status: DC | PRN
  Administered 2019-12-11: 40 [IU] via INTRAVENOUS

## 2019-12-11 MED ORDER — LACTATED RINGERS IV SOLN: INTRAVENOUS | Status: DC | PRN

## 2019-12-11 MED ORDER — METFORMIN HCL ER 500 MG PO TB24
1000.0000 mg | ORAL_TABLET | Freq: Every day | ORAL | Status: DC
  Administered 2019-12-12 – 2019-12-14 (×3): 1000 mg via ORAL
  Filled 2019-12-11 (×3): qty 2

## 2019-12-11 MED ORDER — KETOROLAC TROMETHAMINE 30 MG/ML IJ SOLN: 30.0000 mg | Freq: Once | INTRAMUSCULAR | Status: DC | PRN

## 2019-12-11 MED ORDER — HYDROMORPHONE HCL 1 MG/ML IJ SOLN: 0.2500 mg | INTRAMUSCULAR | Status: DC | PRN

## 2019-12-11 MED ORDER — ONDANSETRON HCL 4 MG/2ML IJ SOLN
INTRAMUSCULAR | Status: DC | PRN
  Administered 2019-12-11: 4 mg via INTRAVENOUS

## 2019-12-11 MED ORDER — SIMETHICONE 80 MG PO CHEW: 80.0000 mg | CHEWABLE_TABLET | ORAL | Status: DC | PRN

## 2019-12-11 MED ORDER — SIMETHICONE 80 MG PO CHEW
80.0000 mg | CHEWABLE_TABLET | Freq: Three times a day (TID) | ORAL | Status: DC
  Administered 2019-12-12 – 2019-12-14 (×7): 80 mg via ORAL
  Filled 2019-12-11 (×7): qty 1

## 2019-12-11 MED ORDER — LEVOTHYROXINE SODIUM 112 MCG PO TABS
112.0000 ug | ORAL_TABLET | Freq: Every day | ORAL | Status: DC
  Administered 2019-12-12 – 2019-12-14 (×3): 112 ug via ORAL
  Filled 2019-12-11 (×4): qty 1

## 2019-12-11 MED ORDER — MORPHINE SULFATE (PF) 0.5 MG/ML IJ SOLN
INTRAMUSCULAR | Status: AC
  Filled 2019-12-11: qty 10

## 2019-12-11 MED ORDER — PHENYLEPHRINE HCL-NACL 20-0.9 MG/250ML-% IV SOLN
INTRAVENOUS | Status: AC
  Filled 2019-12-11: qty 250

## 2019-12-11 MED ORDER — BUTORPHANOL TARTRATE 1 MG/ML IJ SOLN
1.0000 mg | INTRAMUSCULAR | Status: DC | PRN
  Administered 2019-12-11 (×2): 1 mg via INTRAVENOUS
  Filled 2019-12-11 (×2): qty 1

## 2019-12-11 MED ORDER — PHENYLEPHRINE 40 MCG/ML (10ML) SYRINGE FOR IV PUSH (FOR BLOOD PRESSURE SUPPORT)
PREFILLED_SYRINGE | INTRAVENOUS | Status: DC | PRN
  Administered 2019-12-11: 80 ug via INTRAVENOUS
  Administered 2019-12-11: 120 ug via INTRAVENOUS
  Administered 2019-12-11: 80 ug via INTRAVENOUS

## 2019-12-11 MED ORDER — ENOXAPARIN SODIUM 60 MG/0.6ML ~~LOC~~ SOLN
60.0000 mg | SUBCUTANEOUS | Status: DC
  Administered 2019-12-12 – 2019-12-13 (×2): 60 mg via SUBCUTANEOUS
  Filled 2019-12-11 (×2): qty 0.6

## 2019-12-11 MED ORDER — PRENATAL MULTIVITAMIN CH
1.0000 | ORAL_TABLET | Freq: Every day | ORAL | Status: DC
  Administered 2019-12-12 – 2019-12-13 (×2): 1 via ORAL
  Filled 2019-12-11 (×2): qty 1

## 2019-12-11 MED ORDER — DEXTROSE 5 % IV SOLN
INTRAVENOUS | Status: AC
  Filled 2019-12-11: qty 3000

## 2019-12-11 MED ORDER — OXYCODONE HCL 5 MG PO TABS: 5.0000 mg | ORAL_TABLET | Freq: Once | ORAL | Status: DC | PRN

## 2019-12-11 MED ORDER — TERBUTALINE SULFATE 1 MG/ML IJ SOLN: 0.2500 mg | Freq: Once | INTRAMUSCULAR | Status: DC | PRN

## 2019-12-11 MED ORDER — OXYTOCIN 40 UNITS IN NORMAL SALINE INFUSION - SIMPLE MED
1.0000 m[IU]/min | INTRAVENOUS | Status: DC
  Administered 2019-12-11: 07:00:00 2 m[IU]/min via INTRAVENOUS
  Filled 2019-12-11: qty 1000

## 2019-12-11 MED ORDER — SENNOSIDES-DOCUSATE SODIUM 8.6-50 MG PO TABS
2.0000 | ORAL_TABLET | ORAL | Status: DC
  Administered 2019-12-11 – 2019-12-13 (×3): 2 via ORAL
  Filled 2019-12-11 (×3): qty 2

## 2019-12-11 MED ORDER — SODIUM CHLORIDE 0.9 % IV SOLN: INTRAVENOUS | Status: DC | PRN

## 2019-12-11 MED ORDER — FENTANYL CITRATE (PF) 100 MCG/2ML IJ SOLN
INTRAMUSCULAR | Status: DC | PRN
  Administered 2019-12-11: 15 ug via INTRATHECAL

## 2019-12-11 MED ORDER — MENTHOL 3 MG MT LOZG: 1.0000 | LOZENGE | OROMUCOSAL | Status: DC | PRN

## 2019-12-11 MED ORDER — PROMETHAZINE HCL 25 MG/ML IJ SOLN: 6.2500 mg | INTRAMUSCULAR | Status: DC | PRN

## 2019-12-11 MED ORDER — SIMETHICONE 80 MG PO CHEW
80.0000 mg | CHEWABLE_TABLET | ORAL | Status: DC
  Administered 2019-12-11 – 2019-12-13 (×3): 80 mg via ORAL
  Filled 2019-12-11 (×3): qty 1

## 2019-12-11 MED ORDER — COCONUT OIL OIL: 1.0000 "application " | TOPICAL_OIL | Status: DC | PRN

## 2019-12-11 MED ORDER — LACTATED RINGERS IV SOLN: INTRAVENOUS | Status: DC

## 2019-12-11 MED ORDER — SCOPOLAMINE 1 MG/3DAYS TD PT72
MEDICATED_PATCH | TRANSDERMAL | Status: AC
  Filled 2019-12-11: qty 1

## 2019-12-11 MED ORDER — DIPHENHYDRAMINE HCL 50 MG/ML IJ SOLN: 12.5000 mg | INTRAMUSCULAR | Status: DC | PRN

## 2019-12-11 MED ORDER — MORPHINE SULFATE (PF) 0.5 MG/ML IJ SOLN
INTRAMUSCULAR | Status: DC | PRN
  Administered 2019-12-11: .15 mg via INTRATHECAL

## 2019-12-11 MED ORDER — OXYCODONE HCL 5 MG/5ML PO SOLN: 5.0000 mg | Freq: Once | ORAL | Status: DC | PRN

## 2019-12-11 MED ORDER — KETOROLAC TROMETHAMINE 30 MG/ML IJ SOLN: 30.0000 mg | Freq: Four times a day (QID) | INTRAMUSCULAR | Status: AC | PRN

## 2019-12-11 MED ORDER — SCOPOLAMINE 1 MG/3DAYS TD PT72
1.0000 | MEDICATED_PATCH | Freq: Once | TRANSDERMAL | Status: AC
  Administered 2019-12-11: 1.5 mg via TRANSDERMAL

## 2019-12-11 SURGICAL SUPPLY — 40 items
CHLORAPREP W/TINT 26ML (MISCELLANEOUS) ×3 IMPLANT
CLAMP CORD UMBIL (MISCELLANEOUS) IMPLANT
CLOSURE WOUND 1/2 X4 (GAUZE/BANDAGES/DRESSINGS)
CLOTH BEACON ORANGE TIMEOUT ST (SAFETY) ×3 IMPLANT
DRSG OPSITE POSTOP 4X10 (GAUZE/BANDAGES/DRESSINGS) ×3 IMPLANT
ELECT REM PT RETURN 9FT ADLT (ELECTROSURGICAL) ×3
ELECTRODE REM PT RTRN 9FT ADLT (ELECTROSURGICAL) ×1 IMPLANT
EXTENDER TRAXI PANNICULUS (MISCELLANEOUS) IMPLANT
EXTRACTOR VACUUM M CUP 4 TUBE (SUCTIONS) IMPLANT
EXTRACTOR VACUUM M CUP 4' TUBE (SUCTIONS)
GLOVE BIO SURGEON STRL SZ 6.5 (GLOVE) ×2 IMPLANT
GLOVE BIO SURGEONS STRL SZ 6.5 (GLOVE) ×1
GLOVE BIOGEL PI IND STRL 7.0 (GLOVE) ×2 IMPLANT
GLOVE BIOGEL PI INDICATOR 7.0 (GLOVE) ×4
GOWN STRL REUS W/TWL LRG LVL3 (GOWN DISPOSABLE) ×6 IMPLANT
KIT ABG SYR 3ML LUER SLIP (SYRINGE) IMPLANT
NDL HYPO 25X5/8 SAFETYGLIDE (NEEDLE) IMPLANT
NEEDLE HYPO 22GX1.5 SAFETY (NEEDLE) IMPLANT
NEEDLE HYPO 25X5/8 SAFETYGLIDE (NEEDLE) IMPLANT
NS IRRIG 1000ML POUR BTL (IV SOLUTION) ×3 IMPLANT
PACK C SECTION WH (CUSTOM PROCEDURE TRAY) ×3 IMPLANT
PAD OB MATERNITY 4.3X12.25 (PERSONAL CARE ITEMS) ×3 IMPLANT
PENCIL SMOKE EVAC W/HOLSTER (ELECTROSURGICAL) ×3 IMPLANT
PREVENA RESTOR AXIOFORM 29X28 (GAUZE/BANDAGES/DRESSINGS) ×2 IMPLANT
RETRACTOR TRAXI PANNICULUS (MISCELLANEOUS) IMPLANT
STRIP CLOSURE SKIN 1/2X4 (GAUZE/BANDAGES/DRESSINGS) IMPLANT
SUT MON AB 4-0 PS1 27 (SUTURE) ×3 IMPLANT
SUT PLAIN 0 NONE (SUTURE) IMPLANT
SUT PLAIN 2 0 XLH (SUTURE) IMPLANT
SUT VIC AB 0 CT1 36 (SUTURE) ×6 IMPLANT
SUT VIC AB 0 CTX 36 (SUTURE) ×6
SUT VIC AB 0 CTX36XBRD ANBCTRL (SUTURE) ×2 IMPLANT
SUT VIC AB 2-0 CT1 27 (SUTURE) ×3
SUT VIC AB 2-0 CT1 TAPERPNT 27 (SUTURE) ×1 IMPLANT
SYR CONTROL 10ML LL (SYRINGE) IMPLANT
TOWEL OR 17X24 6PK STRL BLUE (TOWEL DISPOSABLE) ×3 IMPLANT
TRAXI PANNICULUS EXTENDER (MISCELLANEOUS) ×2
TRAXI PANNICULUS RETRACTOR (MISCELLANEOUS) ×2
TRAY FOLEY W/BAG SLVR 14FR LF (SET/KITS/TRAYS/PACK) IMPLANT
WATER STERILE IRR 1000ML POUR (IV SOLUTION) ×3 IMPLANT

## 2019-12-11 NOTE — Op Note (Signed)
12/10/2019 - 12/11/2019  5:50 PM  PATIENT:  Brenda Sharp  39 y.o. female  PRE-OPERATIVE DIAGNOSIS:  C/S-Non reassuring fetal testing  POST-OPERATIVE DIAGNOSIS:  C/S-Non reassuring fetal testing  PROCEDURE:  Procedure(s): CESAREAN SECTION (N/A)  Primary low transverse cesarean section with 2 layer closure  SURGEON:  Surgeon(s) and Role:    Aloha Gell, MD - Primary  PHYSICIAN ASSISTANT:   ASSISTANTS: Lars Pinks, CNM  ANESTHESIA:   spinal  EBL: Per OR notes  BLOOD ADMINISTERED:none  DRAINS: Urinary Catheter (Foley)   LOCAL MEDICATIONS USED:  NONE  SPECIMEN:  Source of Specimen:  Placenta  DISPOSITION OF SPECIMEN:  To labor and delivery for disposal  COUNTS:  YES  TOURNIQUET:  * No tourniquets in log *  DICTATION: .Note written in EPIC  PLAN OF CARE: Admit to inpatient   PATIENT DISPOSITION:  PACU - hemodynamically stable.   Delay start of Pharmacological VTE Sharp (>24hrs) due to surgical blood loss or risk of bleeding: yes     Findings:  @BABYSEXEBC @ infant,  APGAR (1 MIN): 7   APGAR (5 MINS): 8   APGAR (10 MINS):   Normal uterus, tubes and ovaries, normal placenta. 3VC, clear amniotic fluid, morbid obesity, cord pH 6.99  EBL: Per OR notes cc Antibiotics:  3g Ancef Complications: none  Indications: This is a 39 y.o. year-old, G3 P0-0-2-0 at [redacted]w[redacted]d admitted for induction of labor due to gestational hypertension with severe range blood pressures.  Patient had Cytotec x3 then on initiation of Pitocin developed recurrent late decelerations.  Pitocin was stopped with eventual recovery of the fetal strip and then while Pitocin was off there was 90  second deceleration.  Discussion was had with the baby about probable placental insufficiency and fetal intolerance to labor.  Given patient was remote from delivery at only 2 cm decision was made to proceed with primary cesarean section.  Risks benefits and alternatives of the procedure were discussed with the  patient who agreed to proceed  Procedure:  After informed consent was obtained the patient was taken to the operating room where spinal anesthesia was initiated.  She was prepped and draped in the normal sterile fashion in dorsal supine position with a leftward tilt.  A foley catheter was in place.  A Pfannenstiel skin incision was made 2 cm above the pubic symphysis in the midline with the scalpel.  Dissection was carried down with the Bovie cautery until the fascia was reached. The fascia was incised in the midline. The incision was extended laterally with the Mayo scissors. The inferior aspect of the fascial incision was grasped with the Coker clamps, elevated up and the underlying rectus muscles were dissected off sharply. The superior aspect of the fascial incision was grasped with the Coker clamps elevated up and the underlying rectus muscles were dissected off sharply.  The peritoneum was entered bluntly. The peritoneal incision was extended superiorly and inferiorly with good visualization of the bladder. The bladder blade was inserted and palpation was done to assess the fetal position and the location of the uterine vessels. The lower segment of the uterus was incised sharply with the scalpel and extended  bluntly in the cephalo-caudal fashion. The infant was grasped, brought to the incision,  rotated and the infant was delivered with fundal pressure.  Copious amniotic fluid was noted.  The baby was pale with poor tone and no immediate cry.  Decision was made to clamped and cut the cord and hand the baby to the pediatrician  within about 20 seconds.   The placenta was expressed. The uterus was unable to be exteriorized due to the patient's habitus. The uterus was cleared of all clots and debris. The uterine incision was repaired with 0 Vicryl in a running locked fashion.  A second layer of the same suture was used in an imbricating fashion to obtain excellent hemostasis.  The uterus was then returned to  the abdomen, the gutters were cleared of all clots and debris. The uterine incision was reinspected and found to be hemostatic. The peritoneum was grasped and closed with 2-0 Vicryl in a running fashion. The cut muscle edges and the underside of the fascia were inspected and found to be hemostatic. The fascia was closed with 0 Vicryl in 2 halves. The subcutaneous tissue was irrigated. Scarpa's layer was closed with a 2-0 plain gut suture. The skin was closed with a 4-0 Monocryl in a single layer. The patient tolerated the procedure well. Sponge lap and needle counts were correct x3 and patient was taken to the recovery room in a stable condition.  Ala Dach 12/11/2019 5:52 PM

## 2019-12-11 NOTE — Progress Notes (Signed)
Reddened area under pannus on patient's right side.  Patient states the area has "been there for a while" and that it was present prior to admission.

## 2019-12-11 NOTE — Progress Notes (Signed)
Brenda Sharp is a 39 y.o. G3P0020 at [redacted]w[redacted]d by ultrasound admitted for induction of labor due to Hypertension with severe range BPs .  Subjective: Some cramping, slept with Stadol S/p another IV Labetalol last night for 1803/110, no symptoms   Objective: BP (!) 158/97   Pulse 66   Temp 98.4 F (36.9 C) (Oral)   Resp 16   Ht 5\' 3"  (1.6 m)   Wt 127.3 kg   SpO2 98%   BMI 49.71 kg/m  I/O last 3 completed shifts: In: 302.1 [I.V.:302.1] Out: -  No intake/output data recorded. Patient Vitals for the past 24 hrs:  BP Temp Temp src Pulse Resp SpO2 Height Weight  12/11/19 0546 (!) 158/97 -- -- 66 16 -- -- --  12/11/19 0451 (!) 142/81 -- -- 69 16 -- -- --  12/11/19 0340 (!) 159/91 -- -- 79 16 -- -- --  12/11/19 0335 -- 98.4 F (36.9 C) Oral -- -- -- -- --  12/11/19 0207 139/73 -- -- 79 16 -- -- --  12/11/19 0201 (!) 147/92 -- -- 75 16 -- -- --  12/11/19 0152 (!) 165/100 -- -- 75 16 -- -- --  12/11/19 0134 (!) 183/110 -- -- 88 16 -- -- --  12/11/19 0131 (!) 183/115 -- -- 89 16 -- -- --  12/10/19 2308 (!) 153/87 98.2 F (36.8 C) Oral 90 16 -- -- --  12/10/19 2156 (!) 158/104 -- -- 93 -- -- -- --  12/10/19 1914 128/80 -- -- 90 -- -- -- --  12/10/19 1804 (!) 143/81 -- -- 90 -- -- -- --  12/10/19 1530 123/87 98.4 F (36.9 C) Oral 84 16 -- -- --  12/10/19 1450 (!) 132/91 98.4 F (36.9 C) Oral 76 16 -- 5\' 3"  (1.6 m) 127.3 kg  12/10/19 1436 -- -- -- -- -- 98 % -- --  12/10/19 1432 (!) 156/96 -- -- 79 -- -- -- --  12/10/19 1411 -- -- -- -- -- 98 % -- --  12/10/19 1406 -- -- -- -- -- 98 % -- --  12/10/19 1401 128/84 -- -- 71 -- 97 % -- --  12/10/19 1356 -- -- -- -- -- 97 % -- --  12/10/19 1352 122/74 -- -- 73 -- -- -- --  12/10/19 1351 -- -- -- -- -- 96 % -- --  12/10/19 1350 120/77 -- -- 76 -- -- -- --  12/10/19 1346 -- -- -- -- -- 98 % -- --  12/10/19 1341 -- -- -- -- -- 96 % -- --  12/10/19 1336 -- -- -- -- -- 97 % -- --  12/10/19 1331 (!) 160/99 -- -- 78 -- 97 % -- --  12/10/19  1326 -- -- -- -- -- 97 % -- --  12/10/19 1321 -- -- -- -- -- 97 % -- --  12/10/19 1317 (!) 152/90 -- -- 86 -- -- -- --  12/10/19 1316 -- -- -- -- -- 98 % -- --  12/10/19 1300 (!) 164/103 -- -- -- -- -- -- --  12/10/19 1247 (!) 164/103 -- -- 87 -- -- -- --  12/10/19 1246 -- -- -- -- -- 97 % -- --  12/10/19 1241 -- -- -- -- -- 97 % -- --  12/10/19 1236 -- -- -- -- -- 97 % -- --  12/10/19 1232 (!) 155/98 -- -- 86 -- -- -- --  12/10/19 1231 -- -- -- -- -- 97 % -- --  12/10/19  1205 (!) 157/99 98.8 F (37.1 C) Oral 80 18 98 % 5\' 3"  (1.6 m) 127.3 kg   FHR: 150 bpm, variability: moderate,  accelerations:  Present,  decelerations:  Absent UC:   irregular SVE:  Cx difficult to reach but feel head engaging at inlet. S/p 3 doses of Cytotec.    Assessment / Plan: IOL 37.1 wks, s/p cytotec x 3 doses. Start pitocin now and assess cervix if moved more anterior. EFW 7.1/2 lbs   -CHTN with severe range BPs  - normal labs and urine P/c ratio but intermittent severe range BPs with IV Labetalol. Pt on Procardia 30mg  XL at night and Labetalol PO 200 mg TID  -A2GDM- BS nl range, q 4 hrs, Metformin 1000 mg XL at night   Fetal Wellbeing:  Category I Pain Control:  options discussed, epidural plan in active labor  I/D:  n/a Anticipated MOD:  continue to work towards vaginal delivery  Elveria Royals 12/11/2019, 6:56 AM

## 2019-12-11 NOTE — Anesthesia Preprocedure Evaluation (Signed)
Anesthesia Evaluation  Patient identified by MRN, date of birth, ID band Patient awake    Reviewed: Allergy & Precautions, NPO status , Patient's Chart, lab work & pertinent test results, reviewed documented beta blocker date and time   Airway Mallampati: II  TM Distance: >3 FB Neck ROM: Full    Dental no notable dental hx. (+) Teeth Intact, Dental Advisory Given   Pulmonary neg pulmonary ROS,    Pulmonary exam normal breath sounds clear to auscultation       Cardiovascular hypertension, Pt. on home beta blockers and Pt. on medications Normal cardiovascular exam Rhythm:Regular Rate:Normal     Neuro/Psych  Headaches, PSYCHIATRIC DISORDERS Depression    GI/Hepatic negative GI ROS, Neg liver ROS,   Endo/Other  diabetes, Gestational, Oral Hypoglycemic AgentsHypothyroidism Morbid obesityBMI 50  Renal/GU negative Renal ROS  Female GU complaint PCOS    Musculoskeletal negative musculoskeletal ROS (+)   Abdominal (+) + obese,   Peds negative pediatric ROS (+)  Hematology negative hematology ROS (+) hct 39.6, plt 249   Anesthesia Other Findings   Reproductive/Obstetrics (+) Pregnancy                             Anesthesia Physical Anesthesia Plan  ASA: III  Anesthesia Plan: Spinal   Post-op Pain Management:    Induction:   PONV Risk Score and Plan: 3 and Ondansetron, Dexamethasone and Treatment may vary due to age or medical condition  Airway Management Planned: Natural Airway and Nasal Cannula  Additional Equipment: None  Intra-op Plan:   Post-operative Plan:   Informed Consent: I have reviewed the patients History and Physical, chart, labs and discussed the procedure including the risks, benefits and alternatives for the proposed anesthesia with the patient or authorized representative who has indicated his/her understanding and acceptance.       Plan Discussed with:  CRNA  Anesthesia Plan Comments: (Fetal intolerance to labor)        Anesthesia Quick Evaluation

## 2019-12-11 NOTE — Transfer of Care (Signed)
Immediate Anesthesia Transfer of Care Note  Patient: Brenda Sharp  Procedure(s) Performed: CESAREAN SECTION (N/A )  Patient Location: PACU  Anesthesia Type:Spinal  Level of Consciousness: awake  Airway & Oxygen Therapy: Patient Spontanous Breathing  Post-op Assessment: Report given to RN and Post -op Vital signs reviewed and stable  Post vital signs: stable  Last Vitals:  Vitals Value Taken Time  BP 99/63 12/11/19 1747  Temp    Pulse 89 12/11/19 1749  Resp 14 12/11/19 1749  SpO2 91 % 12/11/19 1749  Vitals shown include unvalidated device data.  Last Pain:  Vitals:   12/11/19 1449  TempSrc: Oral  PainSc: 4          Complications: No apparent anesthesia complications

## 2019-12-11 NOTE — Anesthesia Postprocedure Evaluation (Signed)
Anesthesia Post Note  Patient: Brenda Sharp  Procedure(s) Performed: CESAREAN SECTION (N/A )     Patient location during evaluation: PACU Anesthesia Type: Spinal Level of consciousness: awake and alert and oriented Pain management: pain level controlled Vital Signs Assessment: post-procedure vital signs reviewed and stable Respiratory status: spontaneous breathing, nonlabored ventilation and respiratory function stable Cardiovascular status: blood pressure returned to baseline and stable Postop Assessment: no headache, no backache, spinal receding and patient able to bend at knees Anesthetic complications: no    Last Vitals:  Vitals:   12/11/19 1449 12/11/19 1747  BP: (!) 149/93 99/63  Pulse: 79   Resp: 20   Temp: 37 C 37.1 C  SpO2:      Last Pain:  Vitals:   12/11/19 1747  TempSrc: Oral  PainSc: 0-No pain   Pain Goal:                Epidural/Spinal Function Cutaneous sensation: No Sensation (12/11/19 1747), Patient able to flex knees: No (12/11/19 1747), Patient able to lift hips off bed: No (12/11/19 1747), Back pain beyond tenderness at insertion site: No (12/11/19 1747), Progressively worsening motor and/or sensory loss: No (12/11/19 1747), Bowel and/or bladder incontinence post epidural: No (12/11/19 1747)  Pervis Hocking

## 2019-12-11 NOTE — Progress Notes (Signed)
   12/11/19 2255  Vital Signs  BP (!) 145/91  BP Location Left Arm  Patient Position (if appropriate) Semi-fowlers  BP Method Automatic  Pulse Rate 90  Pulse Rate Source Dinamap  Resp 18  Temp 98.8 F (37.1 C)  Temp Source Oral  Oxygen Therapy  SpO2 98 %   Pt had bp of 145/91 lying down and 151/100 sitting. [employer] Midwife notified and stated she would continue to monitor trend. Midwife stated it was still okay to ambulate patient out of bed.

## 2019-12-11 NOTE — Progress Notes (Signed)
Brenda Sharp is a 39 y.o. G3P0020 at [redacted]w[redacted]d by ultrasound admitted for induction of labor due to Hypertension with severe range BPs .  Subjective: Some cramping  Objective: BP (!) 153/87   Pulse 90   Temp 98.2 F (36.8 C) (Oral)   Resp 16   Ht 5\' 3"  (1.6 m)   Wt 127.3 kg   SpO2 98%   BMI 49.71 kg/m  I/O last 3 completed shifts: In: 302.1 [I.V.:302.1] Out: -  No intake/output data recorded. Patient Vitals for the past 24 hrs:  BP Temp Temp src Pulse Resp SpO2 Height Weight  12/10/19 2308 (!) 153/87 98.2 F (36.8 C) Oral 90 16 -- -- --  12/10/19 2156 (!) 158/104 -- -- 93 -- -- -- --  12/10/19 1914 128/80 -- -- 90 -- -- -- --  12/10/19 1804 (!) 143/81 -- -- 90 -- -- -- --  12/10/19 1530 123/87 98.4 F (36.9 C) Oral 84 16 -- -- --  12/10/19 1450 (!) 132/91 98.4 F (36.9 C) Oral 76 16 -- 5\' 3"  (1.6 m) 127.3 kg  12/10/19 1436 -- -- -- -- -- 98 % -- --  12/10/19 1432 (!) 156/96 -- -- 79 -- -- -- --  12/10/19 1411 -- -- -- -- -- 98 % -- --  12/10/19 1406 -- -- -- -- -- 98 % -- --  12/10/19 1401 128/84 -- -- 71 -- 97 % -- --  12/10/19 1356 -- -- -- -- -- 97 % -- --  12/10/19 1352 122/74 -- -- 73 -- -- -- --  12/10/19 1351 -- -- -- -- -- 96 % -- --  12/10/19 1350 120/77 -- -- 76 -- -- -- --  12/10/19 1346 -- -- -- -- -- 98 % -- --  12/10/19 1341 -- -- -- -- -- 96 % -- --  12/10/19 1336 -- -- -- -- -- 97 % -- --  12/10/19 1331 (!) 160/99 -- -- 78 -- 97 % -- --  12/10/19 1326 -- -- -- -- -- 97 % -- --  12/10/19 1321 -- -- -- -- -- 97 % -- --  12/10/19 1317 (!) 152/90 -- -- 86 -- -- -- --  12/10/19 1316 -- -- -- -- -- 98 % -- --  12/10/19 1300 (!) 164/103 -- -- -- -- -- -- --  12/10/19 1247 (!) 164/103 -- -- 87 -- -- -- --  12/10/19 1246 -- -- -- -- -- 97 % -- --  12/10/19 1241 -- -- -- -- -- 97 % -- --  12/10/19 1236 -- -- -- -- -- 97 % -- --  12/10/19 1232 (!) 155/98 -- -- 86 -- -- -- --  12/10/19 1231 -- -- -- -- -- 97 % -- --  12/10/19 1205 (!) 157/99 98.8 F (37.1 C)  Oral 80 18 98 % 5\' 3"  (1.6 m) 127.3 kg   FHT:  FHR: 150 bpm, variability: moderate,  accelerations:  Present,  decelerations:  Absent UC:   irregular SVE:   Dilation: Closed Effacement (%): Thick Station: Ballotable Exam by:: Mary Martinique Johnson, RN   S/p Cytotec x 2 doses   Labs: Lab Results  Component Value Date   WBC 9.2 12/10/2019   HGB 13.3 12/10/2019   HCT 40.9 12/10/2019   MCV 91.5 12/10/2019   PLT 331 12/10/2019    Assessment / Plan: IOL 37.1 wks, s/p cytotec x 2 doses. Will assess for 3rd dose vs cervical foley. Unengaged head. EFW 7.1/2 lbs  Preeclampsia:  None, normal urine P/c ratio but severe range BPs at admission, improved now   A2GDM- BS nl range, q 4 hrs Fetal Wellbeing:  Category I Pain Control:  options discussed, epidural plan in active labor  I/D:  n/a Anticipated MOD:  continue to work towards vaginal delivery  Brenda Sharp 12/11/2019, 1:21 AM

## 2019-12-11 NOTE — Brief Op Note (Signed)
12/10/2019 - 12/11/2019  5:50 PM  PATIENT:  Brenda Sharp  39 y.o. female  PRE-OPERATIVE DIAGNOSIS:  C/S-Non reassuring fetal testing  POST-OPERATIVE DIAGNOSIS:  C/S-Non reassuring fetal testing  PROCEDURE:  Procedure(s): CESAREAN SECTION (N/A)  Primary low transverse cesarean section with 2 layer closure  SURGEON:  Surgeon(s) and Role:    Aloha Gell, MD - Primary  PHYSICIAN ASSISTANT:   ASSISTANTS: Lars Pinks, CNM  ANESTHESIA:   spinal  EBL: Per OR notes  BLOOD ADMINISTERED:none  DRAINS: Urinary Catheter (Foley)   LOCAL MEDICATIONS USED:  NONE  SPECIMEN:  Source of Specimen:  Placenta  DISPOSITION OF SPECIMEN:  To labor and delivery for disposal  COUNTS:  YES  TOURNIQUET:  * No tourniquets in log *  DICTATION: .Note written in EPIC  PLAN OF CARE: Admit to inpatient   PATIENT DISPOSITION:  PACU - hemodynamically stable.   Delay start of Pharmacological VTE Sharp (>24hrs) due to surgical blood loss or risk of bleeding: yes

## 2019-12-11 NOTE — Progress Notes (Signed)
S: Doing well, no complaints, pain well controlled as patient not yet in labor.   O: BP (!) 149/93   Pulse 79   Temp 98.6 F (37 C) (Oral)   Resp 20   Ht 5\' 3"  (1.6 m)   Wt 127.3 kg   SpO2 98%   BMI 49.71 kg/m    FHT:  FHR: 145's bpm, variability: moderate,  accelerations:  Present,  decelerations:  Present Recurrent late decelerations during Pitocin that persisted months Pitocin shot off for 30 minutes.  Returned to normal baseline with adequate variability.  Upon patient movement 1/92 prolonged deceleration with decreased variability following intervention UC:   Rare without Pitocin SVE:   Dilation: 2 Effacement (%): 60 Station: -2 Exam by:: M. Sigmon, CNM   A / P:  39 y.o.  OB History  Gravida Para Term Preterm AB Living  3 0 0 0 2 0  SAB TAB Ectopic Multiple Live Births  2 0 0 0 0   at [redacted]w[redacted]d Nonreassuring fetal testing.  Discussed with patient option to attempt again at Pitocin but given the recurrent late decelerations this baby is unlikely to tolerate trial of labor.  Risk of resuming Pitocin would be the need for an emergent cesarean section, which would be complicated by patient's habitus.  Early onset diabetes with recent blood sugar 131.  Chronic hypertension with labile blood pressures and no evidence of preeclampsia.  No known drug allergies.  Will need 3 g of Ancef.  We will plan post operative Lovenox given VTE risk due to habitus  Fetal Wellbeing:  Category II and Currently though worsening strip while on Pitocin Pain Control:  Plan spinal for primary cesarean section  Anticipated MOD:  Primary cesarean section.  Risk benefits discussed with patient and partner who agreed to proceed  Ala Dach 12/11/2019, 4:00 PM

## 2019-12-11 NOTE — Anesthesia Procedure Notes (Addendum)
Spinal  Patient location during procedure: OR Start time: 12/11/2019 4:09 PM End time: 12/11/2019 4:09 PM Staffing Performed: other anesthesia staff  Preanesthetic Checklist Completed: patient identified, IV checked, site marked, risks and benefits discussed, surgical consent, monitors and equipment checked, pre-op evaluation and timeout performed Spinal Block Patient position: sitting Prep: ChloraPrep Patient monitoring: heart rate, cardiac monitor, continuous pulse ox and blood pressure Approach: midline Location: L3-4 Needle Needle type: Pencil-Tip  Needle gauge: 24 G Needle length: 5 cm Assessment Sensory level: T6 Additional Notes +FFCSF, Clear aspirate prior to injection. 1.31mL Bupi 0.75% + Glucose, 54mcg Fentanyl, 142mcg Duramorph. T6 level noted.  Performed by srna, supervised by anesthesiologist

## 2019-12-12 ENCOUNTER — Encounter (HOSPITAL_COMMUNITY): Payer: Self-pay | Admitting: Obstetrics & Gynecology

## 2019-12-12 LAB — CBC
HCT: 34.7 % — ABNORMAL LOW (ref 36.0–46.0)
Hemoglobin: 11.3 g/dL — ABNORMAL LOW (ref 12.0–15.0)
MCH: 30.1 pg (ref 26.0–34.0)
MCHC: 32.6 g/dL (ref 30.0–36.0)
MCV: 92.3 fL (ref 80.0–100.0)
Platelets: 239 10*3/uL (ref 150–400)
RBC: 3.76 MIL/uL — ABNORMAL LOW (ref 3.87–5.11)
RDW: 13.4 % (ref 11.5–15.5)
WBC: 9.8 10*3/uL (ref 4.0–10.5)
nRBC: 0 % (ref 0.0–0.2)

## 2019-12-12 LAB — COMPREHENSIVE METABOLIC PANEL
ALT: 9 U/L (ref 0–44)
AST: 16 U/L (ref 15–41)
Albumin: 1.8 g/dL — ABNORMAL LOW (ref 3.5–5.0)
Alkaline Phosphatase: 86 U/L (ref 38–126)
Anion gap: 3 — ABNORMAL LOW (ref 5–15)
BUN: 15 mg/dL (ref 6–20)
CO2: 25 mmol/L (ref 22–32)
Calcium: 8.3 mg/dL — ABNORMAL LOW (ref 8.9–10.3)
Chloride: 110 mmol/L (ref 98–111)
Creatinine, Ser: 1.25 mg/dL — ABNORMAL HIGH (ref 0.44–1.00)
GFR calc Af Amer: >60 mL/min (ref >60)
GFR calc non Af Amer: 55 mL/min — ABNORMAL LOW (ref >60)
Glucose, Bld: 107 mg/dL — ABNORMAL HIGH (ref 70–99)
Potassium: 5.1 mmol/L (ref 3.5–5.1)
Sodium: 138 mmol/L (ref 135–145)
Total Bilirubin: 0.6 mg/dL (ref 0.3–1.2)
Total Protein: 4.4 g/dL — ABNORMAL LOW (ref 6.5–8.1)

## 2019-12-12 LAB — GLUCOSE, CAPILLARY
Glucose-Capillary: 128 mg/dL — ABNORMAL HIGH (ref 70–99)
Glucose-Capillary: 166 mg/dL — ABNORMAL HIGH (ref 70–99)

## 2019-12-12 MED ORDER — NIFEDIPINE ER OSMOTIC RELEASE 30 MG PO TB24
30.0000 mg | ORAL_TABLET | Freq: Once | ORAL | Status: AC
  Administered 2019-12-12: 30 mg via ORAL
  Filled 2019-12-12: qty 1

## 2019-12-12 MED ORDER — NIFEDIPINE ER OSMOTIC RELEASE 30 MG PO TB24
60.0000 mg | ORAL_TABLET | Freq: Every day | ORAL | Status: DC
  Administered 2019-12-13 – 2019-12-14 (×2): 60 mg via ORAL
  Filled 2019-12-12 (×2): qty 2

## 2019-12-12 MED ORDER — IBUPROFEN 800 MG PO TABS
800.0000 mg | ORAL_TABLET | Freq: Three times a day (TID) | ORAL | Status: DC
  Administered 2019-12-12 – 2019-12-14 (×6): 800 mg via ORAL
  Filled 2019-12-12 (×5): qty 1

## 2019-12-12 NOTE — Progress Notes (Signed)
Patient given mother baby booklet and admission paperwork.

## 2019-12-12 NOTE — Progress Notes (Signed)
IV removed due to being occluded/clotted off, patient able to drinking many fluids po and able to eat well. No nausea or vomiting since c-section. Encouraged to keep up the fluid intake po, and ambulation in the hallway tid.

## 2019-12-12 NOTE — Progress Notes (Signed)
Patient screened out for psychosocial assessment since none of the following apply:  Psychosocial stressors documented in mother or baby's chart  Gestation less than 32 weeks  Code at delivery   Infant with anomalies Please contact the Clinical Social Worker if specific needs arise, by MOB's request, or if MOB scores greater than 9/yes to question 10 on Edinburgh Postpartum Depression Screen.  Karine Garn, LCSW Clinical Social Worker [facility] Cell#: [phone]     

## 2019-12-12 NOTE — Progress Notes (Signed)
Patient set up with DEBP, patient has eaten breakfast and a glucose was not obtained before meal.

## 2019-12-12 NOTE — Progress Notes (Signed)
POD#1 status post primary cesarean section for fetal intolerance during labor.  Chronic hypertension with superimposed preeclampsia  S: Pt notes no pain no spinal anesthesia still working. Minimal lochia, nl void, out of bed w/o dizziness or chest pain, tol reg po, no flatus yet.  Patient notes no headache, no right upper quadrant pain.  Baby in NICU  Vitals:   12/12/19 0000 12/12/19 0254 12/12/19 0635 12/12/19 0818  BP: (!) 147/95 (!) 142/78 133/79 (!) 143/85  Pulse: (!) 104 86 71 79  Resp:  18 18 18   Temp:  98.3 F (36.8 C) 98.3 F (36.8 C) 98.3 F (36.8 C)  TempSrc:  Oral Oral Oral  SpO2:  97% 97% 98%  Weight:      Height:        Gen: well appearing CV: RRR Pulm: CTAB Abd: soft, obese, ND, approp tender, fundus difficult to palpate due to habitus though suspect below umbilicus, NT, no right upper quadrant pain Inc: Prevena dressing in place LE: 1+ edema, NT  CBC    Component Value Date/Time   WBC 9.8 12/12/2019 0536   RBC 3.76 (L) 12/12/2019 0536   HGB 11.3 (L) 12/12/2019 0536   HCT 34.7 (L) 12/12/2019 0536   PLT 239 12/12/2019 0536   MCV 92.3 12/12/2019 0536   MCH 30.1 12/12/2019 0536   MCHC 32.6 12/12/2019 0536   RDW 13.4 12/12/2019 0536   LYMPHSABS 2,784 04/28/2016 1449   MONOABS 464 04/28/2016 1449   EOSABS 0 (L) 04/28/2016 1449   BASOSABS 0 04/28/2016 1449    A/P: POD#1 s/p primary cesarean section - post-op. Doing well.  Watch for postoperative pain later today -Chronic hypertension with superimposed preeclampsia.  Creatinine 1.25 continue oral hydration as IV has infiltrated.  Repeat labs tomorrow.  Blood pressures elevated on Procardia 30 megs XL but no severe range pressures.  No symptoms of preeclampsia.  Will increase Procardia to 60 mg if continuing to have elevated blood pressures over the next 24 hours will add labetalol. -Early onset gestational diabetes.  On Metformin.  Continue to watch blood sugars postpartum.  Will move to fasting and 2-hour  postprandial given elevated blood pressure this morning and early onset of diabetes, cannot rule out type II.  Ala Dach 12/12/2019 12:15 PM

## 2019-12-13 LAB — CBC WITH DIFFERENTIAL/PLATELET
Abs Immature Granulocytes: 0.04 10*3/uL (ref 0.00–0.07)
Basophils Absolute: 0 10*3/uL (ref 0.0–0.1)
Basophils Relative: 0 %
Eosinophils Absolute: 0.1 10*3/uL (ref 0.0–0.5)
Eosinophils Relative: 1 %
HCT: 34.1 % — ABNORMAL LOW (ref 36.0–46.0)
Hemoglobin: 11.3 g/dL — ABNORMAL LOW (ref 12.0–15.0)
Immature Granulocytes: 0 %
Lymphocytes Relative: 26 %
Lymphs Abs: 2.4 10*3/uL (ref 0.7–4.0)
MCH: 30.5 pg (ref 26.0–34.0)
MCHC: 33.1 g/dL (ref 30.0–36.0)
MCV: 92.2 fL (ref 80.0–100.0)
Monocytes Absolute: 0.5 10*3/uL (ref 0.1–1.0)
Monocytes Relative: 6 %
Neutro Abs: 6 10*3/uL (ref 1.7–7.7)
Neutrophils Relative %: 67 %
Platelets: 251 10*3/uL (ref 150–400)
RBC: 3.7 MIL/uL — ABNORMAL LOW (ref 3.87–5.11)
RDW: 13.7 % (ref 11.5–15.5)
WBC: 9.1 10*3/uL (ref 4.0–10.5)
nRBC: 0 % (ref 0.0–0.2)

## 2019-12-13 LAB — COMPREHENSIVE METABOLIC PANEL
ALT: 11 U/L (ref 0–44)
AST: 13 U/L — ABNORMAL LOW (ref 15–41)
Albumin: 1.9 g/dL — ABNORMAL LOW (ref 3.5–5.0)
Alkaline Phosphatase: 86 U/L (ref 38–126)
Anion gap: 9 (ref 5–15)
BUN: 10 mg/dL (ref 6–20)
CO2: 24 mmol/L (ref 22–32)
Calcium: 8.5 mg/dL — ABNORMAL LOW (ref 8.9–10.3)
Chloride: 106 mmol/L (ref 98–111)
Creatinine, Ser: 0.86 mg/dL (ref 0.44–1.00)
GFR calc Af Amer: >60 mL/min (ref >60)
GFR calc non Af Amer: >60 mL/min (ref >60)
Glucose, Bld: 125 mg/dL — ABNORMAL HIGH (ref 70–99)
Potassium: 3.8 mmol/L (ref 3.5–5.1)
Sodium: 139 mmol/L (ref 135–145)
Total Bilirubin: 0.3 mg/dL (ref 0.3–1.2)
Total Protein: 4.8 g/dL — ABNORMAL LOW (ref 6.5–8.1)

## 2019-12-13 LAB — GLUCOSE, CAPILLARY
Glucose-Capillary: 110 mg/dL — ABNORMAL HIGH (ref 70–99)
Glucose-Capillary: 137 mg/dL — ABNORMAL HIGH (ref 70–99)
Glucose-Capillary: 86 mg/dL (ref 70–99)
Glucose-Capillary: 95 mg/dL (ref 70–99)

## 2019-12-13 LAB — RPR: RPR Ser Ql: NONREACTIVE

## 2019-12-13 MED ORDER — OXYCODONE-ACETAMINOPHEN 5-325 MG PO TABS
1.0000 | ORAL_TABLET | ORAL | Status: DC | PRN
  Administered 2019-12-13 – 2019-12-14 (×4): 1 via ORAL
  Filled 2019-12-13 (×5): qty 1

## 2019-12-13 NOTE — Lactation Note (Signed)
This note was copied from a baby's chart. Lactation Consultation Note  Patient Name: Brenda Sharp [id number] Date: 12/13/2019 Reason for consult: Follow-up assessment  P1 mother whose infant is now 36 hours old.  This is an ETI at 37+1 weeks and in the NICU.  Baby was admitted for hypoglycemia and desaturations.  Mother has a history of diabetes and PCOS.  Family was getting ready to walk to the NICU to visit their daughter, [name], when I arrived.  Mother has been pumping, however, admitted that she has not been pumping every three hours.  Stressed the importance of pumping at least every three hours and to include breast massage and hand expression along with pumping.  Mother has been able to obtain colostrum drops at this time.  She will increase her pumping frequency and verbalized understanding of the importance of this.  Mother denies pain with pumping.  Reminded her that she is able to transport her pump pieces to the NICU if she is interested in pumping at baby's bedside.  Mother has no questions/concerns at this time.  Asked her to call me if she needs my assistance with pumping.  She will have a lactation consult this afternoon and will attempt to latch baby to the breast at this time.  Father supportive.     Maternal Data    Feeding Feeding Type: Bottle Fed - Formula Nipple Type: Dr. Clement Husbands  [facility] Score                   Interventions    Lactation Tools Discussed/Used     Consult Status Consult Status: Follow-up Date: 12/14/19 Follow-up type: In-patient    Jessamy Torosyan R Jasani Dolney 12/13/2019, 8:45 AM

## 2019-12-13 NOTE — Progress Notes (Signed)
No c/o; tol po; ambulating; pain controlled; denies sob/cp; voiding w/o difficulty nml lochia; baby in nicu d/t poor sugar control; no h/a, vision changes, ruq pain  Patient Vitals for the past 24 hrs:  BP Temp Temp src Pulse Resp SpO2  12/13/19 0554 (!) 136/94 98 F (36.7 C) Oral (!) 101 18 --  12/12/19 2100 (!) 128/96 98.4 F (36.9 C) Oral (!) 119 18 99 %  12/12/19 1617 136/89 -- -- 82 -- --  12/12/19 1601 (!) 148/90 98.4 F (36.9 C) Oral 90 18 99 %  12/12/19 1242 140/82 98.3 F (36.8 C) Oral 78 18 99 %   A&ox3 rrr ctab Abd: +bs, soft, nt, nd; obese; dressing c/d/i; fundus firm and below umb LE: 1+ edema, nt bilat  CBC Latest Ref Rng & Units 12/13/2019 12/12/2019 12/11/2019  WBC 4.0 - 10.5 K/uL 9.1 9.8 10.6(H)  Hemoglobin 12.0 - 15.0 g/dL 11.3(L) 11.3(L) 11.9(L)  Hematocrit 36.0 - 46.0 % 34.1(L) 34.7(L) 36.8  Platelets 150 - 400 K/uL 251 239 254   CMP Latest Ref Rng & Units 12/13/2019 12/12/2019 12/11/2019  Glucose 70 - 99 mg/dL 125(H) 107(H) -  BUN 6 - 20 mg/dL 10 15 -  Creatinine 0.44 - 1.00 mg/dL 0.86 1.25(H) 1.05(H)  Sodium 135 - 145 mmol/L 139 138 -  Potassium 3.5 - 5.1 mmol/L 3.8 5.1 -  Chloride 98 - 111 mmol/L 106 110 -  CO2 22 - 32 mmol/L 24 25 -  Calcium 8.9 - 10.3 mg/dL 8.5(L) 8.3(L) -  Total Protein 6.5 - 8.1 g/dL 4.8(L) 4.4(L) -  Total Bilirubin 0.3 - 1.2 mg/dL 0.3 0.6 -  Alkaline Phos 38 - 126 U/L 86 86 -  AST 15 - 41 U/L 13(L) 16 -  ALT 0 - 44 U/L 11 9 -   A/P : pod2 s/p 1ltcs 1. Doing well, contin care; plan d/c home tomorrow 2. chtn with superimposed pre-e: creatinine now wnl today (1.25 yesterday), mild range bps - contin procardia 60mg  and monitor closely 3. Early onset gdm, on metformin; fasting today 110, 2hr pp 128/166/137; has not been eating diabetic diet, encourage continued compliance; will likely need to contin fsbs after d/c and f/u as outpt, pt agrees

## 2019-12-13 NOTE — Lactation Note (Signed)
This note was copied from a baby's chart. Lactation Consultation Note  Patient Name: Brenda Sharp [id number] Date: 12/13/2019 Reason for consult: Follow-up assessment;Early term 37-38.6wks;Primapara;1st time breastfeeding Type of Endocrine Disorder?: Diabetes  P1 mother whose infant is now 67 hours old.  This is an ETI at 37+1 weeks and in the NICU.  Baby was admitted for hypoglycemia and desaturations.  Mother has a history of diabetes and PCOS.  Mother requested lactation assistance.  When I arrived baby was dressed in a tshirt and swaddled.  Suggested mother feed STS and she was willing to remove clothing.  Stimulated "[name]" to awaken her and she awakened easily.  Asked mother to demonstrate hand expression; worked on her technique and she was able to express a few drops from the left breast which I finger fed back to baby.  Assisted baby to latch in the football hold on the left breast.  However, once at the breast baby pushed back and was fussy.  Attempted a few more times with the same result.  Suggested mother feed a small amount from the bottle.  After a few mls, attempted to latch again with the same results.  Baby latched, took a couple of sucks and pushed back.  She did not show any further cues.  Allowed mother to continue feeding with the formula.  Discussed paced bottle feeding with the parents.  Observed mother feeding and made recommendations for a more effective feed using the Dr. Saul Fordyce preemie nipple.  Baby had a wide gape and flanged lips.  She consumed the formula easily and paced well.  Discussed burping.    Encouraged mother to call for lactation assistance as needed and to speak with her nurses about latching to the breast.  Mother has a DEBP for home use.  Father present.  RN updated.   Maternal Data Formula Feeding for Exclusion: No Has patient been taught Hand Expression?: Yes Does the patient have breastfeeding experience prior to this delivery?: No  Feeding Feeding  Type: Breast Fed Nipple Type: Dr. Clement Husbands  LATCH Score Latch: Too sleepy or reluctant, no latch achieved, no sucking elicited.  Audible Swallowing: None  Type of Nipple: Everted at rest and after stimulation  Comfort (Breast/Nipple): Soft / non-tender  Hold (Positioning): Assistance needed to correctly position infant at breast and maintain latch.  LATCH Score: 5  Interventions Interventions: Breast feeding basics reviewed;Assisted with latch;Skin to skin;Breast massage;Hand express;Breast compression;Adjust position;Position options;Support pillows  Lactation Tools Discussed/Used     Consult Status Consult Status: PRN Date: 12/13/19 Follow-up type: In-patient    Dylan Ruotolo R Alfrieda Tarry 12/13/2019, 11:29 AM

## 2019-12-13 NOTE — Lactation Note (Signed)
This note was copied from a baby's chart. Lactation Consultation Note Baby is 54 hrs old. Baby in NICU for respiratory distress and hypoglycemia after delivery.  Mom is diabetic w/PCOS.  Mom resting in bed w/TV on room dark. Baby isn't going to the breast yet. Mom stated baby is getting bottles. Asked mom if she would like the baby to go to the breast, mom stated yes. Asked mom to have NICU call set up appt. For feeding time when [facility] can come and assist w/latching. Mom stated the baby will feed at 0800, 1100, 1400, 1700 tomorrow. Encouraged mom to have RN to set up time w/LC for consult. Mom stated OK.  Mom is pumping, states getting a smear of colostrum. Encouraged mom to pump every 3 hrs as if she was feeding the baby. Gave mom a few colostrum containers if she collects anything.  Mom tired. Told mom to call if has questions or needs assistance. Lactation brochure and NICU booklet given.   Patient Name: Brenda Sharp [id number] Date: 12/13/2019 Reason for consult: Initial assessment;NICU baby;Primapara;Early term 37-38.6wks;Maternal endocrine disorder Type of Endocrine Disorder?: PCOS   Maternal Data Has patient been taught Hand Expression?: No Does the patient have breastfeeding experience prior to this delivery?: No  Feeding Feeding Type: Formula Nipple Type: Nfant Slow Flow (purple)  LATCH Score                   Interventions    Lactation Tools Discussed/Used Tools: Pump Breast pump type: Double-Electric Breast Pump WIC Program: No Pump Review: Setup, frequency, and cleaning;Milk Storage Initiated by:: RN Date initiated:: 12/11/19   Consult Status Consult Status: Follow-up Date: 12/13/19 Follow-up type: In-patient    Theodoro Kalata 12/13/2019, 12:59 AM

## 2019-12-14 LAB — GLUCOSE, CAPILLARY
Glucose-Capillary: 79 mg/dL (ref 70–99)
Glucose-Capillary: 127 mg/dL — ABNORMAL HIGH (ref 70–99)

## 2019-12-14 MED ORDER — IBUPROFEN 800 MG PO TABS: 800.0000 mg | ORAL_TABLET | Freq: Three times a day (TID) | ORAL | 0 refills | Status: DC

## 2019-12-14 MED ORDER — NIFEDIPINE ER OSMOTIC RELEASE 60 MG PO TB24
60.0000 mg | ORAL_TABLET | Freq: Every day | ORAL | 1 refills | Status: DC
Start: 2019-12-14 — End: 2020-04-16

## 2019-12-14 MED ORDER — OXYCODONE-ACETAMINOPHEN 5-325 MG PO TABS: 1.0000 | ORAL_TABLET | ORAL | 0 refills | Status: DC | PRN

## 2019-12-14 NOTE — Progress Notes (Signed)
No c/o; tol po; ambulating; +flatus and had bm; voiding w/o difficulty Baby in NICU, improving with feeds and doing well No h/a, vision changes, ruq pain; followed diabetic diet  Patient Vitals for the past 24 hrs:  BP Temp Temp src Pulse Resp SpO2  12/14/19 0730 140/84 - - - - -  12/14/19 0601 (!) 142/99 98 F (36.7 C) Oral 94 18 -  12/13/19 2137 (!) 151/91 98.3 F (36.8 C) Oral (!) 101 18 -  12/13/19 1752 (!) 143/91 98.6 F (37 C) Oral 98 18 100 %   A&ox3 rrr ctab Abd: soft, nt, nd; obese; fundus firm and below umb; dressing intact/provena LE: 2+ LE, no edema, nt bilat  Bs: improved, fasting today 79, pp breakfast 127 and pp meals yesterday wnl  A/P: pod 3 s/p 1ltcs for NRFHT 1. Post op- meeting milestones, d/c home today 2. chtn with super imposed pre-e: creatine wnl, contin procardia 60mg  xl q day, plan f/u 1 wk for bp check; pt will monitor bps at home and precautions given when to call office 3. Early onset gdm, ?pregestational -on metformin; bs much improved; contin diabetic diet and bs, can f/u 1 wk as outpt, precautions given when to call office; plan 2 hr gtt at pp visit 4. Obesity 5. Rh positive 6. Hypothyroid, synthroid and f/u outpt 7. Depression, stable on duloxetine, monitor for pp depresion 8. Rubella Immune

## 2019-12-17 ENCOUNTER — Other Ambulatory Visit (HOSPITAL_COMMUNITY): Payer: BC Managed Care – PPO

## 2019-12-19 ENCOUNTER — Inpatient Hospital Stay (HOSPITAL_COMMUNITY): Payer: BC Managed Care – PPO

## 2019-12-19 ENCOUNTER — Inpatient Hospital Stay (HOSPITAL_COMMUNITY)
Admission: AD | Admit: 2019-12-19 | Payer: BC Managed Care – PPO | Source: Home / Self Care | Admitting: Obstetrics & Gynecology

## 2019-12-23 NOTE — Discharge Summary (Signed)
Postpartum Discharge Summary  Date of Service updated     Patient Name: Brenda Sharp DOB: July 23, 1981 MRN: [id number]  Date of admission: 12/10/2019 Delivery date:12/11/2019  Delivering provider: Aloha Gell  Date of discharge: 12/23/2019  Admitting diagnosis: Chronic hypertension with superimposed preeclampsia [O11.9] Intrauterine pregnancy: [redacted]w[redacted]d     Secondary diagnosis:  Principal Problem:   Postpartum care following cesarean delivery (5/6) Active Problems:   Chronic hypertension with superimposed preeclampsia  Additional problems: ama, obesity, hypothyroid, gdma2, depression, gout, torticollis, pcos    Discharge diagnosis: CHTN with superimposed preeclampsia and GDM A2                                              Post partum procedures:n/a  Complications: None  Hospital course: Induction of Labor With Cesarean Section   39 y.o. yo [id number] at [redacted]w[redacted]d was admitted to the hospital 12/10/2019 for induction of labor. Patient had a labor course significant for NRFHT. The patient went for cesarean section due to Non-Reassuring FHR. Delivery details are as follows: Membrane Rupture Time/Date: 4:25 PM ,12/11/2019   Delivery Method:C-Section, Low Transverse  Details of operation can be found in separate operative Note.  Patient had an uncomplicated postpartum course. She is ambulating, tolerating a regular diet, passing flatus, and urinating well.  Patient is discharged home in stable condition on 12/23/19.      Newborn Data: Birth date:12/11/2019  Birth time:4:53 PM  Gender:Female  Living status:Living  Apgars:7 ,8  Weight:3.53 kg                                 Magnesium Sulfate received: No BMZ received: No Rhophylac:No   Physical exam  Vitals:   12/13/19 1752 12/13/19 2137 12/14/19 0601 12/14/19 0730  BP: (!) 143/91 (!) 151/91 (!) 142/99 140/84  Pulse: 98 (!) 101 94   Resp: 18 18 18    Temp: 98.6 F (37 C) 98.3 F (36.8 C) 98 F (36.7 C)   TempSrc: Oral Oral Oral    SpO2: 100%     Weight:      Height:       General: alert, cooperative and no distress Lochia: appropriate Uterine Fundus: firm Incision: Healing well with no significant drainage DVT Evaluation: No evidence of DVT seen on physical exam. Labs: Lab Results  Component Value Date   WBC 9.1 12/13/2019   HGB 11.3 (L) 12/13/2019   HCT 34.1 (L) 12/13/2019   MCV 92.2 12/13/2019   PLT 251 12/13/2019   CMP Latest Ref Rng & Units 12/13/2019  Glucose 70 - 99 mg/dL 125(H)  BUN 6 - 20 mg/dL 10  Creatinine 0.44 - 1.00 mg/dL 0.86  Sodium 135 - 145 mmol/L 139  Potassium 3.5 - 5.1 mmol/L 3.8  Chloride 98 - 111 mmol/L 106  CO2 22 - 32 mmol/L 24  Calcium 8.9 - 10.3 mg/dL 8.5(L)  Total Protein 6.5 - 8.1 g/dL 4.8(L)  Total Bilirubin 0.3 - 1.2 mg/dL 0.3  Alkaline Phos 38 - 126 U/L 86  AST 15 - 41 U/L 13(L)  ALT 0 - 44 U/L 11   Edinburgh Score: Edinburgh Postnatal Depression Scale Screening Tool 12/12/2019  I have been able to laugh and see the funny side of things. 0  I have looked forward with enjoyment to things. 0  I have blamed myself unnecessarily when things went wrong. 1  I have been anxious or worried for no good reason. 0  I have felt scared or panicky for no good reason. 0  Things have been getting on top of me. 0  I have been so unhappy that I have had difficulty sleeping. 0  I have felt sad or miserable. 1  I have been so unhappy that I have been crying. 0  The thought of harming myself has occurred to me. 0  Edinburgh Postnatal Depression Scale Total 2      After visit meds:  Allergies as of 12/14/2019      Reactions   Imitrex [sumatriptan]    Chest pain   Januvia [sitagliptin] Other (See Comments)   Nausea, GI upset   Penicillins    Other reaction(s): Other migraines   Victoza [liraglutide] Nausea Only   Colchicine Nausea Only   Trulicity [dulaglutide] Nausea Only      Medication List    STOP taking these medications   acetaminophen 325 MG tablet Commonly known  as: TYLENOL   febuxostat 40 MG tablet Commonly known as: ULORIC   labetalol 200 MG tablet Commonly known as: NORMODYNE   labetalol 300 MG tablet Commonly known as: NORMODYNE     TAKE these medications   calcium carbonate 1250 (500 Ca) MG chewable tablet Commonly known as: OS-CAL Chew 1 tablet by mouth daily.   DULoxetine 30 MG capsule Commonly known as: CYMBALTA Take 1 capsule (30 mg total) by mouth daily.   ibuprofen 800 MG tablet Commonly known as: ADVIL Take 1 tablet (800 mg total) by mouth every 8 (eight) hours.   levothyroxine 112 MCG tablet Commonly known as: SYNTHROID TAKE 1 TABLET(112 MCG) BY MOUTH DAILY What changed:   how much to take  how to take this  when to take this   metFORMIN 500 MG 24 hr tablet Commonly known as: GLUCOPHAGE-XR Take 2 tablets (1,000 mg total) by mouth daily with breakfast.   NIFEdipine 60 MG 24 hr tablet Commonly known as: PROCARDIA XL/NIFEDICAL XL Take 1 tablet (60 mg total) by mouth daily. What changed:   medication strength  how much to take  how to take this  when to take this  additional instructions   oxyCODONE-acetaminophen 5-325 MG tablet Commonly known as: PERCOCET/ROXICET Take 1-2 tablets by mouth every 4 (four) hours as needed (give 1 tablet for mild-moderate pain; 2 tablets for severe pain 7 or higher).   prenatal multivitamin Tabs tablet Take 1 tablet by mouth daily at 12 noon.        Discharge home in stable condition Infant Feeding: Breast Infant Disposition:home with mother Discharge instruction: per After Visit Summary and Postpartum booklet. Activity: Advance as tolerated. Pelvic rest for 6 weeks.  Diet: carb modified diet Anticipated Birth Control: Unsure Postpartum Appointment:1 week Additional Postpartum F/U: BP check 1 week Future Appointments:No future appointments. Follow up Visit: Follow-up Information    Aloha Gell, MD Follow up.   Specialty: Obstetrics and  Gynecology Contact information: [place] [id number] [phone]               12/23/2019 Charyl Bigger, MD

## 2019-12-24 ENCOUNTER — Encounter (HOSPITAL_COMMUNITY): Payer: BC Managed Care – PPO

## 2020-02-10 ENCOUNTER — Other Ambulatory Visit: Payer: Self-pay | Admitting: Family Medicine

## 2020-03-02 ENCOUNTER — Ambulatory Visit: Payer: BC Managed Care – PPO | Admitting: Family Medicine

## 2020-03-16 ENCOUNTER — Encounter: Payer: BC Managed Care – PPO | Admitting: Family Medicine

## 2020-04-15 ENCOUNTER — Ambulatory Visit: Payer: BC Managed Care – PPO | Admitting: Family Medicine

## 2020-04-16 ENCOUNTER — Encounter: Payer: Self-pay | Admitting: Family Medicine

## 2020-04-16 ENCOUNTER — Ambulatory Visit (INDEPENDENT_AMBULATORY_CARE_PROVIDER_SITE_OTHER): Payer: BC Managed Care – PPO | Admitting: Family Medicine

## 2020-04-16 ENCOUNTER — Ambulatory Visit: Payer: BC Managed Care – PPO | Admitting: Family Medicine

## 2020-04-16 VITALS — BP 157/101 | HR 78 | Ht 63.0 in | Wt 247.0 lb

## 2020-04-16 DIAGNOSIS — Z23 Encounter for immunization: Secondary | ICD-10-CM

## 2020-04-16 DIAGNOSIS — I10 Essential (primary) hypertension: Secondary | ICD-10-CM | POA: Diagnosis not present

## 2020-04-16 DIAGNOSIS — G43809 Other migraine, not intractable, without status migrainosus: Secondary | ICD-10-CM

## 2020-04-16 DIAGNOSIS — F3341 Major depressive disorder, recurrent, in partial remission: Secondary | ICD-10-CM | POA: Diagnosis not present

## 2020-04-16 DIAGNOSIS — E088 Diabetes mellitus due to underlying condition with unspecified complications: Secondary | ICD-10-CM | POA: Diagnosis not present

## 2020-04-16 DIAGNOSIS — E038 Other specified hypothyroidism: Secondary | ICD-10-CM

## 2020-04-16 LAB — POCT GLYCOSYLATED HEMOGLOBIN (HGB A1C): Hemoglobin A1C: 5.5 % (ref 4.0–5.6)

## 2020-04-16 MED ORDER — NIFEDIPINE ER OSMOTIC RELEASE 60 MG PO TB24: 60.0000 mg | ORAL_TABLET | Freq: Every day | ORAL | 1 refills | Status: DC

## 2020-04-16 MED ORDER — DULOXETINE HCL 60 MG PO CPEP: 60.0000 mg | ORAL_CAPSULE | Freq: Every day | ORAL | 0 refills | Status: DC

## 2020-04-16 MED ORDER — METFORMIN HCL ER 500 MG PO TB24: 500.0000 mg | ORAL_TABLET | Freq: Every day | ORAL | 1 refills | Status: DC

## 2020-04-16 MED ORDER — LABETALOL HCL 300 MG PO TABS: 300.0000 mg | ORAL_TABLET | Freq: Two times a day (BID) | ORAL | 1 refills | Status: DC

## 2020-04-16 NOTE — Progress Notes (Signed)
Established Patient Office Visit  Subjective:  Patient ID: Brenda Sharp, female    DOB: January 26, 1981  Age: 39 y.o. MRN: [id number]  CC:  Chief Complaint  Patient presents with  . Hypertension    HPI Brenda Sharp presents for hypertension.  She has been out of the nifedipine but she has been taking the labetalol.  Diabetes - no hypoglycemic events. No wounds or sores that are not healing well. No increased thirst or urination. Checking glucose at home. Taking medications as prescribed without any side effects.  Follow-up major depressive disorder-she would actually like to go back up on her duloxetine to 60 mg. She actually feels like her mood has been good but feels like her migraines were better controlled.    Headaches-she reports that she has been getting a few more headaches recently.  Its been more persistent they are not quite as severe as her usual migraines.  Past Medical History:  Diagnosis Date  . Gout   . Headache(784.0)   . Hypertension   . Hypothyroidism   . Kidney stone   . Miscarriage   . PCOS (polycystic ovarian syndrome)   . 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 Torticollis    treated with botox in 2016    Past Surgical History:  Procedure Laterality Date  . CESAREAN SECTION N/A 12/11/2019   Procedure: CESAREAN SECTION;  Surgeon: Aloha Gell, MD;  Location: [facility];  Service: Obstetrics;  Laterality: N/A;  . LITHOTRIPSY    . OTHER SURGICAL HISTORY     done a procedure to rid of arthritis of the right big toe per patient   . OTHER SURGICAL HISTORY Left 2017   gout related-tophi per patient   . TONSILLECTOMY  1991  . tophi removal  08/2016   left knee  . tophi removal  07/2016   right great toe  . WISDOM TOOTH EXTRACTION  2006    Family History  Problem Relation Age of Onset  . Hypertension Father   . Gout Father   . Depression Mother   . Lung cancer Paternal Grandfather        smoker  . Heart attack Paternal Grandfather   . Heart attack Paternal  Grandmother   . Heart attack Maternal Grandfather   . Colon cancer Neg Hx   . Esophageal cancer Neg Hx     Social History   Socioeconomic History  . Marital status: Married    Spouse name: Tu Shimmel  . Number of children: 0  . Years of education: college  . Highest education level: Bachelor's degree (e.g., BA, AB, BS)  Occupational History  . 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 Smoking status: Never Smoker  . Smokeless tobacco: Never Used  Vaping Use  . Vaping Use: Never used  Substance and Sexual Activity  . Alcohol use: No    Alcohol/week: 0.0 standard drinks  . Drug use: No  . Sexual activity: Yes    Partners: Male  Other Topics Concern  . Not on file  Social History Narrative   1 cup caffeine/day   Right-handed.   Lives at home with husband.   Social Determinants of Health   Financial Resource Strain:   . Difficulty of Paying Living Expenses: Not on file  Food Insecurity:   . Worried About Charity fundraiser in the Last Year: Not on file  . Ran Out of Food in the Last Year: Not on  file  Transportation Needs:   . Film/video editor (Medical): Not on file  . Lack of Transportation (Non-Medical): Not on file  Physical Activity:   . Days of Exercise per Week: Not on file  . Minutes of Exercise per Session: Not on file  Stress:   . Feeling of Stress : Not on file  Social Connections:   . Frequency of Communication with Friends and Family: Not on file  . Frequency of Social Gatherings with Friends and Family: Not on file  . Attends Religious Services: Not on file  . Active Member of Clubs or Organizations: Not on file  . Attends Archivist Meetings: Not on file  . Marital Status: Not on file  Intimate Partner Violence:   . Fear of Current or Ex-Partner: Not on file  . Emotionally Abused: Not on file  . Physically Abused: Not on file  . Sexually Abused: Not on file    Outpatient Medications Prior to Visit  Medication  Sig Dispense Refill  . febuxostat (ULORIC) 40 MG tablet Take 1 tablet by mouth daily. 90 tablet 0  . ibuprofen (ADVIL) 800 MG tablet Take 1 tablet (800 mg total) by mouth every 8 (eight) hours. 30 tablet 0  . levothyroxine (SYNTHROID) 112 MCG tablet Take 1 tablet by mouth daily. 90 tablet 0  . calcium carbonate (OS-CAL) 1250 (500 Ca) MG chewable tablet Chew 1 tablet by mouth daily.    . DULoxetine (CYMBALTA) 30 MG capsule Take 1 capsule by mouth daily. 90 capsule 0  . labetalol (NORMODYNE) 300 MG tablet Take 1 tablet by mouth twice daily. 180 tablet 0  . metFORMIN (GLUCOPHAGE-XR) 500 MG 24 hr tablet Take 2 tablets (1,000 mg total) by mouth daily with breakfast. 180 tablet 1  . NIFEdipine (PROCARDIA XL/NIFEDICAL XL) 60 MG 24 hr tablet Take 1 tablet (60 mg total) by mouth daily. 30 tablet 1  . oxyCODONE-acetaminophen (PERCOCET/ROXICET) 5-325 MG tablet Take 1-2 tablets by mouth every 4 (four) hours as needed (give 1 tablet for mild-moderate pain; 2 tablets for severe pain 7 or higher). 30 tablet 0  . Prenatal Vit-Fe Fumarate-FA (PRENATAL MULTIVITAMIN) TABS tablet Take 1 tablet by mouth daily at 12 noon.     No facility-administered medications prior to visit.    Allergies  Allergen Reactions  . Imitrex [Sumatriptan]     Chest pain  . Januvia [Sitagliptin] Other (See Comments)    Nausea, GI upset  . Penicillins     Other reaction(s): Other migraines  . Victoza [Liraglutide] Nausea Only  . Colchicine Nausea Only  . Trulicity [Dulaglutide] Nausea Only    ROS Review of Systems    Objective:    Physical Exam Constitutional:      Appearance: She is well-developed.  HENT:     Head: Normocephalic and atraumatic.  Cardiovascular:     Rate and Rhythm: Normal rate and regular rhythm.     Heart sounds: Normal heart sounds.  Pulmonary:     Effort: Pulmonary effort is normal.     Breath sounds: Normal breath sounds.  Skin:    General: Skin is warm and dry.  Neurological:     Mental  Status: She is alert and oriented to person, place, and time.  Psychiatric:        Behavior: Behavior normal.     BP (!) 157/101   Pulse 78   Ht 5\' 3"  (1.6 m)   Wt 247 lb (112 kg)   SpO2 100%   Breastfeeding  No   BMI 43.75 kg/m  Wt Readings from Last 3 Encounters:  04/16/20 247 lb (112 kg)  12/10/19 280 lb 10.3 oz (127.3 kg)  07/18/19 263 lb (119.3 kg)     Health Maintenance Due  Topic Date Due  . FOOT EXAM  11/10/2018  . URINE MICROALBUMIN  07/04/2019    There are no preventive care reminders to display for this patient.  Lab Results  Component Value Date   TSH 1.73 07/18/2019   Lab Results  Component Value Date   WBC 9.1 12/13/2019   HGB 11.3 (L) 12/13/2019   HCT 34.1 (L) 12/13/2019   MCV 92.2 12/13/2019   PLT 251 12/13/2019   Lab Results  Component Value Date   NA 139 12/13/2019   K 3.8 12/13/2019   CO2 24 12/13/2019   GLUCOSE 125 (H) 12/13/2019   BUN 10 12/13/2019   CREATININE 0.86 12/13/2019   BILITOT 0.3 12/13/2019   ALKPHOS 86 12/13/2019   AST 13 (L) 12/13/2019   ALT 11 12/13/2019   PROT 4.8 (L) 12/13/2019   ALBUMIN 1.9 (L) 12/13/2019   CALCIUM 8.5 (L) 12/13/2019   ANIONGAP 9 12/13/2019   Lab Results  Component Value Date   CHOL 237 (H) 02/25/2018   Lab Results  Component Value Date   HDL 38 (L) 02/25/2018   Lab Results  Component Value Date   LDLCALC 153 (H) 02/25/2018   Lab Results  Component Value Date   TRIG 278 (H) 02/25/2018   Lab Results  Component Value Date   CHOLHDL 6.2 (H) 02/25/2018   Lab Results  Component Value Date   HGBA1C 5.5 04/16/2020      Assessment & Plan:   Problem List Items Addressed This Visit      Cardiovascular and Mediastinum   Migraine headache    More frequent headaches but I really wonder if it could be coming from her elevated blood pressures she is also had significant weight fluctuations since pregnancy which might also be triggering them as well she just feels like she is not responding  to her Botox as well as she has in the past.      Relevant Medications   DULoxetine (CYMBALTA) 60 MG capsule   NIFEdipine (PROCARDIA XL/NIFEDICAL XL) 60 MG 24 hr tablet   labetalol (NORMODYNE) 300 MG tablet   Essential hypertension, benign - Primary    Will restart her nifedipine.  She says she is not planning on getting pregnant again anytime soon but she is also not try to prevent a pregnancy.  We discussed that we could always switch her to a more traditional regimen for blood pressure control.  Such as an ACE, ARB or diuretic.  As for now she will just take the nifedipine we can always readdress again in 6 months.      Relevant Medications   metFORMIN (GLUCOPHAGE-XR) 500 MG 24 hr tablet   NIFEdipine (PROCARDIA XL/NIFEDICAL XL) 60 MG 24 hr tablet   labetalol (NORMODYNE) 300 MG tablet   Other Relevant Orders   POCT glycosylated hemoglobin (Hb A1C) (Completed)   CBC   COMPLETE METABOLIC PANEL WITH GFR   Lipid panel   TSH     Endocrine   Hypothyroidism    Recheck labs.        Relevant Medications   labetalol (NORMODYNE) 300 MG tablet   Other Relevant Orders   TSH   Diabetes mellitus due to underlying condition with unspecified complications ([place])    Well controlled.  In fact her A1c looks phenomenal today at 5.5 some like she can go ahead and decrease her Metformin down to 500 mg extended release daily.  Continue current regimen. Follow up in  3 months.        Relevant Medications   metFORMIN (GLUCOPHAGE-XR) 500 MG 24 hr tablet   Other Relevant Orders   POCT glycosylated hemoglobin (Hb A1C) (Completed)   CBC   COMPLETE METABOLIC PANEL WITH GFR   Lipid panel   TSH     Other   Severe obesity (BMI >= 40) (HCC)    You to work on healthy diet and regular exercise.      Relevant Medications   metFORMIN (GLUCOPHAGE-XR) 500 MG 24 hr tablet   Recurrent major depressive disorder, in partial remission ([place])    Doing well on duloxetine 30 mg but she would like to try  increasing it back to 60 to see if it makes a difference with her headaches which I think is reasonable so go ahead and send a 90-day supply today we can always go back down to 30 mg at any point.      Relevant Medications   DULoxetine (CYMBALTA) 60 MG capsule    Other Visit Diagnoses    Need for immunization against influenza       Relevant Orders   Flu Vaccine QUAD 36+ mos IM (Completed)      Meds ordered this encounter  Medications  . DULoxetine (CYMBALTA) 60 MG capsule    Sig: Take 1 capsule (60 mg total) by mouth daily.    Dispense:  90 capsule    Refill:  0  . metFORMIN (GLUCOPHAGE-XR) 500 MG 24 hr tablet    Sig: Take 1 tablet (500 mg total) by mouth daily with breakfast.    Dispense:  90 tablet    Refill:  1  . NIFEdipine (PROCARDIA XL/NIFEDICAL XL) 60 MG 24 hr tablet    Sig: Take 1 tablet (60 mg total) by mouth daily.    Dispense:  90 tablet    Refill:  1  . labetalol (NORMODYNE) 300 MG tablet    Sig: Take 1 tablet (300 mg total) by mouth 2 (two) times daily.    Dispense:  180 tablet    Refill:  1    Follow-up: Return in about 3 months (around 07/16/2020) for Diabetes, med changes.    Beatrice Lecher, MD

## 2020-04-16 NOTE — Assessment & Plan Note (Signed)
Will restart her nifedipine.  She says she is not planning on getting pregnant again anytime soon but she is also not try to prevent a pregnancy.  We discussed that we could always switch her to a more traditional regimen for blood pressure control.  Such as an ACE, ARB or diuretic.  As for now she will just take the nifedipine we can always readdress again in 6 months.

## 2020-04-16 NOTE — Assessment & Plan Note (Signed)
Doing well on duloxetine 30 mg but she would like to try increasing it back to 60 to see if it makes a difference with her headaches which I think is reasonable so go ahead and send a 90-day supply today we can always go back down to 30 mg at any point.

## 2020-04-16 NOTE — Assessment & Plan Note (Signed)
You to work on [employer] and regular exercise.

## 2020-04-16 NOTE — Assessment & Plan Note (Signed)
More frequent headaches but I really wonder if it could be coming from her elevated blood pressures she is also had significant weight fluctuations since pregnancy which might also be triggering them as well she just feels like she is not responding to her Botox as well as she has in the past.

## 2020-04-16 NOTE — Assessment & Plan Note (Signed)
Well controlled.  In fact her A1c looks phenomenal today at 5.5 some like she can go ahead and decrease her Metformin down to 500 mg extended release daily.  Continue current regimen. Follow up in  3 months.

## 2020-04-16 NOTE — Assessment & Plan Note (Signed)
Recheck labs 

## 2020-04-19 ENCOUNTER — Ambulatory Visit: Payer: BC Managed Care – PPO | Admitting: Family Medicine

## 2020-04-28 ENCOUNTER — Ambulatory Visit: Payer: BC Managed Care – PPO

## 2020-05-11 ENCOUNTER — Other Ambulatory Visit: Payer: Self-pay | Admitting: Family Medicine

## 2020-05-18 ENCOUNTER — Encounter: Payer: Self-pay | Admitting: Family Medicine

## 2020-05-18 MED ORDER — INDOMETHACIN 50 MG PO CAPS: ORAL_CAPSULE | ORAL | 3 refills | Status: AC

## 2020-06-09 ENCOUNTER — Other Ambulatory Visit: Payer: Self-pay | Admitting: Family Medicine

## 2020-06-10 ENCOUNTER — Other Ambulatory Visit: Payer: Self-pay | Admitting: Family Medicine

## 2020-07-11 ENCOUNTER — Other Ambulatory Visit: Payer: Self-pay | Admitting: Family Medicine

## 2020-07-11 DIAGNOSIS — F3341 Major depressive disorder, recurrent, in partial remission: Secondary | ICD-10-CM

## 2020-07-16 ENCOUNTER — Ambulatory Visit: Payer: BC Managed Care – PPO | Admitting: Family Medicine

## 2020-08-04 ENCOUNTER — Other Ambulatory Visit: Payer: Self-pay | Admitting: Family Medicine

## 2020-08-05 ENCOUNTER — Other Ambulatory Visit: Payer: Self-pay

## 2020-08-11 ENCOUNTER — Other Ambulatory Visit: Payer: Self-pay | Admitting: Family Medicine

## 2020-09-09 ENCOUNTER — Other Ambulatory Visit: Payer: Self-pay | Admitting: Family Medicine

## 2020-09-09 ENCOUNTER — Other Ambulatory Visit: Payer: Self-pay | Admitting: *Deleted

## 2020-09-09 NOTE — Telephone Encounter (Signed)
Please call pt and have her to schedule a f/u for  thyroid recheck with labs. thanks

## 2020-09-10 NOTE — Telephone Encounter (Signed)
Patient scheduled for f/u on 09/14/20. AM

## 2020-09-14 ENCOUNTER — Encounter: Payer: Self-pay | Admitting: Family Medicine

## 2020-09-14 ENCOUNTER — Other Ambulatory Visit: Payer: Self-pay

## 2020-09-14 ENCOUNTER — Ambulatory Visit (INDEPENDENT_AMBULATORY_CARE_PROVIDER_SITE_OTHER): Payer: BC Managed Care – PPO | Admitting: Family Medicine

## 2020-09-14 VITALS — BP 131/85 | HR 92 | Ht 63.0 in | Wt 250.0 lb

## 2020-09-14 DIAGNOSIS — E089 Diabetes mellitus due to underlying condition without complications: Secondary | ICD-10-CM | POA: Diagnosis not present

## 2020-09-14 DIAGNOSIS — I10 Essential (primary) hypertension: Secondary | ICD-10-CM | POA: Diagnosis not present

## 2020-09-14 DIAGNOSIS — K760 Fatty (change of) liver, not elsewhere classified: Secondary | ICD-10-CM

## 2020-09-14 DIAGNOSIS — F3341 Major depressive disorder, recurrent, in partial remission: Secondary | ICD-10-CM

## 2020-09-14 DIAGNOSIS — E038 Other specified hypothyroidism: Secondary | ICD-10-CM

## 2020-09-14 DIAGNOSIS — E088 Diabetes mellitus due to underlying condition with unspecified complications: Secondary | ICD-10-CM

## 2020-09-14 LAB — POCT UA - MICROALBUMIN
Creatinine, POC: 300 mg/dL
Microalbumin Ur, POC: 150 mg/L

## 2020-09-14 LAB — POCT GLYCOSYLATED HEMOGLOBIN (HGB A1C): Hemoglobin A1C: 5.7 % — AB (ref 4.0–5.6)

## 2020-09-14 MED ORDER — BUPROPION HCL ER (XL) 150 MG PO TB24: 150.0000 mg | ORAL_TABLET | Freq: Every day | ORAL | 3 refills | Status: DC

## 2020-09-14 MED ORDER — DULOXETINE HCL 60 MG PO CPEP: 60.0000 mg | ORAL_CAPSULE | Freq: Every day | ORAL | 3 refills | Status: DC

## 2020-09-14 NOTE — Addendum Note (Signed)
Addended by: Teddy Spike on: 09/14/2020 04:58 PM   Modules accepted: Orders

## 2020-09-14 NOTE — Assessment & Plan Note (Signed)
A1c looks phenomenal today at 5.7.  Continue the 500 mg extended release Metformin daily.  Plan to follow-up in 4 months.  We will do diabetic foot exam today.  Also will try to collect urine microalbumin if she is able to give a urine sample and get updated blood work.

## 2020-09-14 NOTE — Assessment & Plan Note (Signed)
Well controlled. Continue current regimen. Follow up in  4 months.   

## 2020-09-14 NOTE — Assessment & Plan Note (Signed)
Due to check liver enzymes.

## 2020-09-14 NOTE — Assessment & Plan Note (Addendum)
PHQ-9 score of 2 which she rates for sleep and energy but no depressive symptoms.  And GAD-7 score of 1.  Right symptoms as not difficult.  Doing well on current regimen.  She does need refills on Cymbalta and Wellbutrin.

## 2020-09-14 NOTE — Assessment & Plan Note (Signed)
Due to recheck thyroid levels.

## 2020-09-14 NOTE — Progress Notes (Signed)
Established Patient Office Visit  Subjective:  Patient ID: Brenda Sharp, female    DOB: 1980/09/11  Age: 40 y.o. MRN: [id number]  CC:  Chief Complaint  Patient presents with  . Hypothyroidism    HPI [name] presents for   Hypertension- Pt denies chest pain, SOB, dizziness, or heart palpitations.  Taking meds as directed w/o problems.  Denies medication side effects.    Diabetes - no hypoglycemic events. No wounds or sores that are not healing well. No increased thirst or urination. Checking glucose at home. Taking medications as prescribed without any side effects.  Hypothyroidism - Taking medication regularly in the AM away from food and vitamins, etc. No recent change to skin, hair, or energy levels.   Past Medical History:  Diagnosis Date  . Gout   . Headache(784.0)   . Hypertension   . Hypothyroidism   . Kidney stone   . Miscarriage   . PCOS (polycystic ovarian syndrome)   . 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 Torticollis    treated with botox in 2016    Past Surgical History:  Procedure Laterality Date  . CESAREAN SECTION N/A 12/11/2019   Procedure: CESAREAN SECTION;  Surgeon: Aloha Gell, MD;  Location: [facility];  Service: Obstetrics;  Laterality: N/A;  . LITHOTRIPSY    . OTHER SURGICAL HISTORY     done a procedure to rid of arthritis of the right big toe per patient   . OTHER SURGICAL HISTORY Left 2017   gout related-tophi per patient   . TONSILLECTOMY  1991  . tophi removal  08/2016   left knee  . tophi removal  07/2016   right great toe  . WISDOM TOOTH EXTRACTION  2006    Family History  Problem Relation Age of Onset  . Hypertension Father   . Gout Father   . Depression Mother   . Lung cancer Paternal Grandfather        smoker  . Heart attack Paternal Grandfather   . Heart attack Paternal Grandmother   . Heart attack Maternal Grandfather   . Colon cancer Neg Hx   . Esophageal cancer Neg Hx     Social History   Socioeconomic History  .  Marital status: Married    Spouse name: Michaiah Maiden  . Number of children: 0  . Years of education: college  . Highest education level: Bachelor's degree (e.g., BA, AB, BS)  Occupational History  . 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 Smoking status: Never Smoker  . Smokeless tobacco: Never Used  Vaping Use  . Vaping Use: Never used  Substance and Sexual Activity  . Alcohol use: No    Alcohol/week: 0.0 standard drinks  . Drug use: No  . Sexual activity: Yes    Partners: Male  Other Topics Concern  . Not on file  Social History Narrative   1 cup caffeine/day   Right-handed.   Lives at home with husband.   Social Determinants of Health   Financial Resource Strain: Not on file  Food Insecurity: Not on file  Transportation Needs: Not on file  Physical Activity: Not on file  Stress: Not on file  Social Connections: Not on file  Intimate Partner Violence: Not on file    Outpatient Medications Prior to Visit  Medication Sig Dispense Refill  . febuxostat (ULORIC) 40 MG tablet Take 1 tablet (40 mg total) by mouth daily. 30 tablet 2  .  indomethacin (INDOCIN) 50 MG capsule TAKE ONE CAPSULE BY MOUTH TWICE DAILY, WITH A MEAL AS NEEDED 90 capsule 3  . labetalol (NORMODYNE) 300 MG tablet Take 1 tablet (300 mg total) by mouth 2 (two) times daily. 180 tablet 1  . levothyroxine (SYNTHROID) 112 MCG tablet Take 1 tablet by mouth daily before breakfast. **Please complete labs for additional refills** 90 tablet 0  . metFORMIN (GLUCOPHAGE-XR) 500 MG 24 hr tablet Take 1 tablet (500 mg total) by mouth daily with breakfast. 90 tablet 1  . NIFEdipine (PROCARDIA XL/NIFEDICAL XL) 60 MG 24 hr tablet Take 1 tablet (60 mg total) by mouth daily. 90 tablet 1  . buPROPion (WELLBUTRIN SR) 150 MG 12 hr tablet Take 150 mg by mouth daily.    . DULoxetine (CYMBALTA) 60 MG capsule Take 1 capsule by mouth daily. 90 capsule 0   No facility-administered medications prior to visit.     Allergies  Allergen Reactions  . Imitrex [Sumatriptan]     Chest pain  . Januvia [Sitagliptin] Other (See Comments)    Nausea, GI upset  . Penicillins     Other reaction(s): Other migraines  . Victoza [Liraglutide] Nausea Only  . Colchicine Nausea Only  . Trulicity [Dulaglutide] Nausea Only    ROS Review of Systems    Objective:    Physical Exam Constitutional:      Appearance: She is well-developed and well-nourished.  HENT:     Head: Normocephalic and atraumatic.  Cardiovascular:     Rate and Rhythm: Normal rate and regular rhythm.     Heart sounds: Normal heart sounds.  Pulmonary:     Effort: Pulmonary effort is normal.     Breath sounds: Normal breath sounds.  Skin:    General: Skin is warm and dry.  Neurological:     Mental Status: She is alert and oriented to person, place, and time.  Psychiatric:        Mood and Affect: Mood and affect normal.        Behavior: Behavior normal.     BP 131/85   Pulse 92   Ht 5\' 3"  (1.6 m)   Wt 250 lb (113.4 kg)   SpO2 100%   BMI 44.29 kg/m  Wt Readings from Last 3 Encounters:  09/14/20 250 lb (113.4 kg)  04/16/20 247 lb (112 kg)  12/10/19 280 lb 10.3 oz (127.3 kg)     Health Maintenance Due  Topic Date Due  . FOOT EXAM  11/10/2018  . URINE MICROALBUMIN  07/04/2019  . COVID-19 Vaccine (3 - Booster for Moderna series) 05/06/2020    There are no preventive care reminders to display for this patient.  Lab Results  Component Value Date   TSH 1.73 07/18/2019   Lab Results  Component Value Date   WBC 9.1 12/13/2019   HGB 11.3 (L) 12/13/2019   HCT 34.1 (L) 12/13/2019   MCV 92.2 12/13/2019   PLT 251 12/13/2019   Lab Results  Component Value Date   NA 139 12/13/2019   K 3.8 12/13/2019   CO2 24 12/13/2019   GLUCOSE 125 (H) 12/13/2019   BUN 10 12/13/2019   CREATININE 0.86 12/13/2019   BILITOT 0.3 12/13/2019   ALKPHOS 86 12/13/2019   AST 13 (L) 12/13/2019   ALT 11 12/13/2019   PROT 4.8 (L)  12/13/2019   ALBUMIN 1.9 (L) 12/13/2019   CALCIUM 8.5 (L) 12/13/2019   ANIONGAP 9 12/13/2019   Lab Results  Component Value Date   CHOL 237 (  H) 02/25/2018   Lab Results  Component Value Date   HDL 38 (L) 02/25/2018   Lab Results  Component Value Date   LDLCALC 153 (H) 02/25/2018   Lab Results  Component Value Date   TRIG 278 (H) 02/25/2018   Lab Results  Component Value Date   CHOLHDL 6.2 (H) 02/25/2018   Lab Results  Component Value Date   HGBA1C 5.7 (A) 09/14/2020      Assessment & Plan:   Problem List Items Addressed This Visit      Cardiovascular and Mediastinum   Essential hypertension, benign    Well controlled. Continue current regimen. Follow up in  4 months.          Digestive   Fatty liver    Due to check liver enzymes.        Endocrine   Hypothyroidism    Due to recheck thyroid levels.      Diabetes mellitus due to underlying condition with unspecified complications ([place]) - Primary    A1c looks phenomenal today at 5.7.  Continue the 500 mg extended release Metformin daily.  Plan to follow-up in 4 months.  We will do diabetic foot exam today.  Also will try to collect urine microalbumin if she is able to give a urine sample and get updated blood work.      Relevant Orders   POCT glycosylated hemoglobin (Hb A1C) (Completed)     Other   Recurrent major depressive disorder, in partial remission (HCC)    PHQ-9 score of 2 which she rates for sleep and energy but no depressive symptoms.  And GAD-7 score of 1.  Right symptoms as not difficult.  Doing well on current regimen.  She does need refills on Cymbalta and Wellbutrin.      Relevant Medications   DULoxetine (CYMBALTA) 60 MG capsule   buPROPion (WELLBUTRIN XL) 150 MG 24 hr tablet      Meds ordered this encounter  Medications  . DULoxetine (CYMBALTA) 60 MG capsule    Sig: Take 1 capsule (60 mg total) by mouth daily.    Dispense:  90 capsule    Refill:  3  . buPROPion (WELLBUTRIN XL)  150 MG 24 hr tablet    Sig: Take 1 tablet (150 mg total) by mouth daily.    Dispense:  90 tablet    Refill:  3    Follow-up: Return in about 4 months (around 01/12/2021) for Hypertension, Diabetes follow-up.    Beatrice Lecher, MD

## 2020-09-23 LAB — LIPID PANEL
Cholesterol: 224 mg/dL — ABNORMAL HIGH (ref <200)
HDL: 39 mg/dL — ABNORMAL LOW (ref >=50)
LDL Cholesterol (Calc): 156 mg/dL (calc) — ABNORMAL HIGH
Non-HDL Cholesterol (Calc): 185 mg/dL (calc) — ABNORMAL HIGH (ref <130)
Total CHOL/HDL Ratio: 5.7 (calc) — ABNORMAL HIGH (ref <5.0)
Triglycerides: 155 mg/dL — ABNORMAL HIGH (ref <150)

## 2020-09-23 LAB — CBC
HCT: 38.5 % (ref 35.0–45.0)
Hemoglobin: 12.4 g/dL (ref 11.7–15.5)
MCH: 27.9 pg (ref 27.0–33.0)
MCHC: 32.2 g/dL (ref 32.0–36.0)
MCV: 86.7 fL (ref 80.0–100.0)
MPV: 9.2 fL (ref 7.5–12.5)
Platelets: 387 10*3/uL (ref 140–400)
RBC: 4.44 10*6/uL (ref 3.80–5.10)
RDW: 14.1 % (ref 11.0–15.0)
WBC: 6.6 10*3/uL (ref 3.8–10.8)

## 2020-09-23 LAB — COMPLETE METABOLIC PANEL WITH GFR
AG Ratio: 2 (calc) (ref 1.0–2.5)
ALT: 15 U/L (ref 6–29)
AST: 16 U/L (ref 10–30)
Albumin: 4.2 g/dL (ref 3.6–5.1)
Alkaline phosphatase (APISO): 73 U/L (ref 31–125)
BUN: 13 mg/dL (ref 7–25)
CO2: 29 mmol/L (ref 20–32)
Calcium: 9.5 mg/dL (ref 8.6–10.2)
Chloride: 105 mmol/L (ref 98–110)
Creat: 0.82 mg/dL (ref 0.50–1.10)
GFR, Est African American: 106 mL/min/{1.73_m2} (ref >=60)
GFR, Est Non African American: 91 mL/min/{1.73_m2} (ref >=60)
Globulin: 2.1 g/dL (calc) (ref 1.9–3.7)
Glucose, Bld: 111 mg/dL — ABNORMAL HIGH (ref 65–99)
Potassium: 4.8 mmol/L (ref 3.5–5.3)
Sodium: 141 mmol/L (ref 135–146)
Total Bilirubin: 0.4 mg/dL (ref 0.2–1.2)
Total Protein: 6.3 g/dL (ref 6.1–8.1)

## 2020-09-23 LAB — TSH: TSH: 4.06 mIU/L

## 2020-10-10 ENCOUNTER — Other Ambulatory Visit: Payer: Self-pay | Admitting: Family Medicine

## 2020-10-10 DIAGNOSIS — I10 Essential (primary) hypertension: Secondary | ICD-10-CM

## 2020-10-20 ENCOUNTER — Telehealth: Payer: Self-pay

## 2020-10-20 NOTE — Telephone Encounter (Signed)
Patient dropped off paperwork to be filled out for Disablilty Parking Placard. Paperwork was placed in the message box. Patient would like to be notified in [place] when completed and a phone @ [phone]

## 2020-10-22 NOTE — Telephone Encounter (Signed)
Spoke w/pt about the hadicap placard she stated that she received this 5 yrs ago when she was seeing the doctors at [facility] for her neck.

## 2020-10-28 NOTE — Telephone Encounter (Signed)
The only options on the form are for difficulty with walking and I am not aware of her having difficulty with walking.  Some unable to complete the form.

## 2020-11-03 NOTE — Telephone Encounter (Signed)
Pt informed that Dr. Madilyn Fireman cannot sign this for her.

## 2020-11-10 ENCOUNTER — Other Ambulatory Visit: Payer: Self-pay | Admitting: Family Medicine

## 2021-01-05 ENCOUNTER — Other Ambulatory Visit: Payer: Self-pay | Admitting: Family Medicine

## 2021-01-13 ENCOUNTER — Ambulatory Visit: Payer: BC Managed Care – PPO | Admitting: Family Medicine

## 2021-01-25 ENCOUNTER — Encounter: Payer: Self-pay | Admitting: Physician Assistant

## 2021-01-25 ENCOUNTER — Other Ambulatory Visit: Payer: Self-pay

## 2021-01-25 ENCOUNTER — Ambulatory Visit: Payer: BC Managed Care – PPO | Admitting: Physician Assistant

## 2021-01-25 VITALS — BP 111/76 | HR 83 | Temp 99.1°F | Ht 63.0 in | Wt 252.1 lb

## 2021-01-25 DIAGNOSIS — H6992 Unspecified Eustachian tube disorder, left ear: Secondary | ICD-10-CM | POA: Diagnosis not present

## 2021-01-25 DIAGNOSIS — H9192 Unspecified hearing loss, left ear: Secondary | ICD-10-CM | POA: Diagnosis not present

## 2021-01-25 DIAGNOSIS — Z23 Encounter for immunization: Secondary | ICD-10-CM

## 2021-01-25 MED ORDER — FLUTICASONE PROPIONATE 50 MCG/ACT NA SUSP: 1.0000 | Freq: Every day | NASAL | 0 refills | Status: DC

## 2021-01-25 NOTE — Progress Notes (Signed)
Acute Office Visit  Subjective:    Patient ID: Brenda Sharp, female    DOB: [birth year], 40 y.o.   MRN: [id number]  Chief Complaint  Patient presents with   Ear Pain    Left ear pain x 1 week, muffling, popping    Ear Fullness  There is pain in the left ear. This is a new problem. The current episode started in the past 7 days. The problem occurs constantly. The problem has been unchanged. There has been no fever. The pain is mild (intermittent). Associated symptoms include hearing loss (muffled) and rhinorrhea. Pertinent negatives include no ear discharge, rash or sore throat. She has tried nothing for the symptoms. There is no history of hearing loss or a tympanostomy tube.  Patient is in today for left ear fullness No ringing but there is a pulsation  No dizziness/vertigo  Past Medical History:  Diagnosis Date   Gout    Headache(784.0)    Hypertension    Hypothyroidism    Kidney stone    Miscarriage    PCOS (polycystic ovarian syndrome)    Tophi 2017   Left knee   Torticollis    treated with botox in 2016    Past Surgical History:  Procedure Laterality Date   CESAREAN SECTION N/A 12/11/2019   Procedure: CESAREAN SECTION;  Surgeon: Aloha Gell, MD;  Location: [place] [facility];  Service: Obstetrics;  Laterality: N/A;   LITHOTRIPSY     OTHER SURGICAL HISTORY     done a procedure to rid of arthritis of the right big toe per patient    OTHER SURGICAL HISTORY Left 2017   gout related-tophi per patient    TONSILLECTOMY  1991   tophi removal  08/2016   left knee   tophi removal  07/2016   right great toe   WISDOM TOOTH EXTRACTION  2006    Family History  Problem Relation Age of Onset   Hypertension Father    Gout Father    Depression Mother    Lung cancer Paternal Grandfather        smoker   Heart attack Paternal Grandfather    Heart attack Paternal Grandmother    Heart attack Maternal Grandfather    Colon cancer Neg Hx    Esophageal cancer Neg Hx     Social  History   Socioeconomic History   Marital status: Married    Spouse name: Estreya Clay   Number of children: 0   Years of education: college   Highest education level: Bachelor's degree (e.g., BA, AB, BS)  Occupational History   Occupation: Pharmacist, 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   Lives at home with husband.   Social Determinants of Health   Financial Resource Strain: Not on file  Food Insecurity: Not on file  Transportation Needs: Not on file  Physical Activity: Not on file  Stress: Not on file  Social Connections: Not on file  Intimate Partner Violence: Not on file    Outpatient Medications Prior to Visit  Medication Sig Dispense Refill  buPROPion (WELLBUTRIN XL) 150 MG 24 hr tablet Take 1 tablet (150 mg total) by mouth daily. 90 tablet 3   DULoxetine (CYMBALTA) 60 MG capsule Take 1 capsule (60 mg total) by mouth daily. 90 capsule 3   febuxostat (ULORIC) 40 MG tablet Take 1 tablet by mouth daily. 90 tablet 1   indomethacin (INDOCIN) 50 MG capsule TAKE ONE CAPSULE BY MOUTH TWICE DAILY, WITH A MEAL AS NEEDED 90 capsule 3   labetalol (NORMODYNE) 300 MG tablet Take 1 tablet (300 mg total) by mouth 2 (two) times daily. 180 tablet 1   levothyroxine (SYNTHROID) 112 MCG tablet Take 1 tablet (112 mcg total) by mouth daily before breakfast. 90 tablet 0   metFORMIN (GLUCOPHAGE-XR) 500 MG 24 hr tablet Take 1 tablet (500 mg total) by mouth daily with breakfast. 90 tablet 1   NIFEdipine (PROCARDIA XL/NIFEDICAL XL) 60 MG 24 hr tablet Take 1 tablet by mouth daily. 90 tablet 1   No facility-administered medications prior to visit.    Allergies   Allergen Reactions   Imitrex [Sumatriptan]     Chest pain   Januvia [Sitagliptin] Other (See Comments)    Nausea, GI upset   Penicillins     Other reaction(s): Other migraines   Victoza [Liraglutide] Nausea Only   Colchicine Nausea Only   Trulicity [Dulaglutide] Nausea Only    Review of Systems  HENT:  Positive for ear pain (intermittent, mild), hearing loss (muffled), rhinorrhea, sneezing and tinnitus (low pitched pulsing). Negative for ear discharge, sinus pressure, sinus pain and sore throat.   Skin:  Negative for rash.  Allergic/Immunologic: Positive for environmental allergies.  All other systems reviewed and are negative.     Objective:    Physical Exam Vitals reviewed.  Constitutional:      Appearance: She is not ill-appearing, toxic-appearing or diaphoretic.  HENT:     Right Ear: Hearing, tympanic membrane, ear canal and external ear normal. No mastoid tenderness.     Left Ear: Ear canal and external ear normal. Decreased hearing (intact to finger rub but diminished compared to right) noted. No drainage. A middle ear effusion is present. No mastoid tenderness. Tympanic membrane is not injected or bulging.  Pulmonary:     Effort: Pulmonary effort is normal.  Neurological:     Mental Status: She is alert.  Psychiatric:        Behavior: Behavior normal.        Thought Content: Thought content normal.    BP 111/76   Pulse 83   Temp 99.1 F (37.3 C)   Ht 5\' 3"  (1.6 m)   Wt 252 lb 1.6 oz (114.4 kg)   LMP 12/24/2020   SpO2 98%   BMI 44.66 kg/m  BP Readings from Last 3 Encounters:  01/25/21 111/76  09/14/20 131/85  04/16/20 (!) 157/101    Health Maintenance Due  Topic Date Due   COVID-19 Vaccine (3 - Booster for Moderna series) 04/06/2020   OPHTHALMOLOGY EXAM  11/18/2020   PAP SMEAR-Modifier  12/26/2020    There are no preventive care reminders to display for this patient.   Lab Results  Component Value Date   TSH 4.06 09/22/2020   Lab Results   Component Value Date   WBC 6.6 09/22/2020   HGB 12.4 09/22/2020   HCT 38.5 09/22/2020   MCV 86.7 09/22/2020   PLT 387 09/22/2020   Lab Results  Component Value Date   NA 141 09/22/2020   K 4.8 09/22/2020   CO2 29  09/22/2020   GLUCOSE 111 (H) 09/22/2020   BUN 13 09/22/2020   CREATININE 0.82 09/22/2020   BILITOT 0.4 09/22/2020   ALKPHOS 86 12/13/2019   AST 16 09/22/2020   ALT 15 09/22/2020   PROT 6.3 09/22/2020   ALBUMIN 1.9 (L) 12/13/2019   CALCIUM 9.5 09/22/2020   ANIONGAP 9 12/13/2019   Lab Results  Component Value Date   CHOL 224 (H) 09/22/2020   Lab Results  Component Value Date   HDL 39 (L) 09/22/2020   Lab Results  Component Value Date   LDLCALC 156 (H) 09/22/2020   Lab Results  Component Value Date   TRIG 155 (H) 09/22/2020   Lab Results  Component Value Date   CHOLHDL 5.7 (H) 09/22/2020   Lab Results  Component Value Date   HGBA1C 5.7 (A) 09/14/2020       Assessment & Plan:   Problem List Items Addressed This Visit   None Visit Diagnoses     Eustachian tube disorder, left    -  Primary   Relevant Medications   fluticasone (FLONASE) 50 MCG/ACT nasal spray   Change in hearing of left ear       Need for Tdap vaccination       Relevant Orders   Tdap vaccine greater than or equal to 7yo IM (Completed)      Treat underlying seasonal allergic rhinitis with generic 2ndG antihistamine and intranasal steroid Start OTC Phenylephrine decongestant Counseled patient can take several weeks to a couple of months for effusion to resolve Counseled on return precautions  Meds ordered this encounter  Medications   fluticasone (FLONASE) 50 MCG/ACT nasal spray    Sig: Place 1-2 sprays into both nostrils daily.    Dispense:  16 g    Refill:  0    Order Specific Question:   Supervising Provider    Answer:   Emeterio Reeve [[id number]]      Trixie Dredge, PA-C

## 2021-01-25 NOTE — Patient Instructions (Addendum)
Start generic non-drowsy antihistamine such as Cetirizine (Zyrtec), Claritin  Tdap (Tetanus, Diphtheria, Pertussis) Vaccine: What You Need to Know 1. Why get vaccinated? Tdap vaccine can prevent tetanus, diphtheria, and pertussis. Diphtheria and pertussis spread from person to person. Tetanus enters the body through cuts or wounds. TETANUS (T) causes painful stiffening of the muscles. Tetanus can lead to serious health problems, including being unable to open the mouth, having trouble swallowing and breathing, or death. DIPHTHERIA (D) can lead to difficulty breathing, heart failure, paralysis, or death. PERTUSSIS (aP), also known as "whooping cough," can cause uncontrollable, violent coughing that makes it hard to breathe, eat, or drink. Pertussis can be extremely serious especially in babies and young children, causing pneumonia, convulsions, brain damage, or death. In teens and adults, it can cause weight loss, loss of bladder control, passing out, and rib fractures from severe coughing. 2. Tdap vaccine Tdap is only for children 7 years and older, adolescents, and adults.  Adolescents should receive a single dose of Tdap, preferably at age 45 or 46 years. Pregnant people should get a dose of Tdap during every pregnancy, preferably during the early part of the third trimester, to help protect the newborn from pertussis. Infants are most at risk for severe, life-threatening complications frompertussis. Adults who have never received Tdap should get a dose of Tdap. Also, adults should receive a booster dose of either Tdap or Td (a different vaccine that protects against tetanus and diphtheria but not pertussis) every 10 years, or after 5 years in the case of a severe or dirty wound or burn. Tdap may be given at the same time as other vaccines. 3. Talk with your health care provider Tell your vaccine provider if the person getting the vaccine: Has had an allergic reaction after a previous dose of any  vaccine that protects against tetanus, diphtheria, or pertussis, or has any severe, life-threatening allergies Has had a coma, decreased level of consciousness, or prolonged seizures within 7 days after a previous dose of any pertussis vaccine (DTP, DTaP, or Tdap) Has seizures or another nervous system problem Has ever had Guillain-Barr Syndrome (also called "GBS") Has had severe pain or swelling after a previous dose of any vaccine that protects against tetanus or diphtheria In some cases, your health care provider may decide to postpone Tdapvaccination until a future visit. People with minor illnesses, such as a cold, may be vaccinated. People who are moderately or severely ill should usually wait until they recover beforegetting Tdap vaccine.  Your health care provider can give you more information. 4. Risks of a vaccine reaction Pain, redness, or swelling where the shot was given, mild fever, headache, feeling tired, and nausea, vomiting, diarrhea, or stomachache sometimes happen after Tdap vaccination. People sometimes faint after medical procedures, including vaccination. Tellyour provider if you feel dizzy or have vision changes or ringing in the ears.  As with any medicine, there is a very remote chance of a vaccine causing asevere allergic reaction, other serious injury, or death. 5. What if there is a serious problem? An allergic reaction could occur after the vaccinated person leaves the clinic. If you see signs of a severe allergic reaction (hives, swelling of the face and throat, difficulty breathing, a fast heartbeat, dizziness, or weakness), call [phone] get the person to the nearest hospital. For other signs that concern you, call your health care provider.  Adverse reactions should be reported to the Vaccine Adverse Event Reporting System (VAERS). Your health care provider will usually file  this report, or you can do it yourself. Visit the VAERS website at www.vaers.SamedayNews.es or call  424-140-0507. VAERS is only for reporting reactions, and VAERS staff members do not give medical advice. 6. The National Vaccine Injury Compensation Program The [employer] Vaccine Injury Compensation Program (VICP) is a federal program that was created to compensate people who may have been injured by certain vaccines. Claims regarding alleged injury or death due to vaccination have a time limit for filing, which may be as short as two years. Visit the VICP website at GoldCloset.com.ee or call 781-311-2302to learn about the program and about filing a claim. 7. How can I learn more? Ask your health care provider. Call your local or state health department. Visit the website of the Food and Drug Administration (FDA) for vaccine package inserts and additional information at TraderRating.uy. Contact the Centers for Disease Control and Prevention (CDC): Call 580 444 0441 (1-800-CDC-INFO) or Visit CDC's website at http://hunter.com/. Vaccine Information Statement Tdap (Tetanus, Diphtheria, Pertussis) Vaccine(03/12/2020) This information is not intended to replace advice given to you by your health care provider. Make sure you discuss any questions you have with your healthcare provider. Document Revised: 04/07/2020 Document Reviewed: 04/07/2020 Elsevier Patient Education  2022 [employer].

## 2021-02-14 ENCOUNTER — Telehealth: Payer: Self-pay

## 2021-02-14 NOTE — Telephone Encounter (Signed)
Transition Care Management Unsuccessful Follow-up Telephone Call  Date of discharge and from where:  02/11/2021 from [facility]  Attempts:  1st Attempt  Reason for unsuccessful TCM follow-up call:  Left voice message

## 2021-02-15 NOTE — Telephone Encounter (Signed)
Transition Care Management Unsuccessful Follow-up Telephone Call  Date of discharge and from where:  02/11/21 from [facility]  Attempts:  2nd Attempt  Reason for unsuccessful TCM follow-up call:  Left voice message

## 2021-02-16 NOTE — Telephone Encounter (Signed)
Transition Care Management Unsuccessful Follow-up Telephone Call  Date of discharge and from where:  02/11/21 from [facility]  Attempts:  3rd Attempt  Reason for unsuccessful TCM follow-up call:  Left voice message

## 2021-03-08 ENCOUNTER — Other Ambulatory Visit: Payer: Self-pay

## 2021-03-08 MED ORDER — LABETALOL HCL 300 MG PO TABS: 300.0000 mg | ORAL_TABLET | Freq: Two times a day (BID) | ORAL | 0 refills | Status: DC

## 2021-03-08 NOTE — Telephone Encounter (Signed)
Patient scheduled for 04/05/21 @ 4pm. AM

## 2021-03-08 NOTE — Telephone Encounter (Signed)
Please call pt to schedule appt.  No further refills until pt is seen.  T. Letha Mirabal, CMA  

## 2021-04-05 ENCOUNTER — Other Ambulatory Visit: Payer: Self-pay | Admitting: Family Medicine

## 2021-04-05 ENCOUNTER — Ambulatory Visit: Payer: BC Managed Care – PPO | Admitting: Family Medicine

## 2021-04-05 DIAGNOSIS — I10 Essential (primary) hypertension: Secondary | ICD-10-CM

## 2021-04-06 ENCOUNTER — Other Ambulatory Visit: Payer: Self-pay | Admitting: Family Medicine

## 2021-04-06 NOTE — Telephone Encounter (Signed)
Please call pt and advise her to schedule a f/u for bp/DM for medication refills. Thanks.  30 day supply sent to pharmacy.

## 2021-04-06 NOTE — Telephone Encounter (Signed)
Called pt lvm to  call us back to schedule an appt for a follow up for bp/ dm medication refill.- tvt

## 2021-04-06 NOTE — Telephone Encounter (Signed)
Pt will need an appt for refills on bp medication. Please call and have her to schedule.

## 2021-04-13 NOTE — Telephone Encounter (Signed)
LVM informing pt she needs an appt for further refills

## 2021-05-10 ENCOUNTER — Other Ambulatory Visit: Payer: Self-pay | Admitting: Family Medicine

## 2021-06-05 ENCOUNTER — Other Ambulatory Visit: Payer: Self-pay | Admitting: Family Medicine

## 2021-07-08 ENCOUNTER — Other Ambulatory Visit: Payer: Self-pay | Admitting: Family Medicine

## 2021-07-08 DIAGNOSIS — I10 Essential (primary) hypertension: Secondary | ICD-10-CM

## 2021-08-04 ENCOUNTER — Other Ambulatory Visit: Payer: Self-pay | Admitting: Family Medicine

## 2021-08-04 DIAGNOSIS — F3341 Major depressive disorder, recurrent, in partial remission: Secondary | ICD-10-CM

## 2021-09-05 ENCOUNTER — Ambulatory Visit: Payer: BC Managed Care – PPO | Admitting: Family Medicine

## 2021-09-05 NOTE — Progress Notes (Deleted)
Acute Office Visit  Subjective:    Patient ID: Brenda Sharp, female    DOB: 09-15-1980, 41 y.o.   MRN: [id number]  No chief complaint on file.   HPI Patient is in today for Left shoulder pain.   Past Medical History:  Diagnosis Date   Gout    Headache(784.0)    Hypertension    Hypothyroidism    Kidney stone    Miscarriage    PCOS (polycystic ovarian syndrome)    Tophi 2017   Left knee   Torticollis    treated with botox in 2016    Past Surgical History:  Procedure Laterality Date   CESAREAN SECTION N/A 12/11/2019   Procedure: CESAREAN SECTION;  Surgeon: Aloha Gell, MD;  Location: [place] [facility];  Service: Obstetrics;  Laterality: N/A;   LITHOTRIPSY     OTHER SURGICAL HISTORY     done a procedure to rid of arthritis of the right big toe per patient    OTHER SURGICAL HISTORY Left 2017   gout related-tophi per patient    TONSILLECTOMY  1991   tophi removal  08/2016   left knee   tophi removal  07/2016   right great toe   WISDOM TOOTH EXTRACTION  2006    Family History  Problem Relation Age of Onset   Hypertension Father    Gout Father    Depression Mother    Lung cancer Paternal Grandfather        smoker   Heart attack Paternal Grandfather    Heart attack Paternal Grandmother    Heart attack Maternal Grandfather    Colon cancer Neg Hx    Esophageal cancer Neg Hx     Social History   Socioeconomic History   Marital status: Married    Spouse name: Akeela Busk   Number of children: 0   Years of education: college   Highest education level: Bachelor's degree (e.g., BA, AB, BS)  Occupational History   Occupation: Pharmacist, 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   Lives at home with husband.   Social Determinants of Health   Financial Resource Strain: Not on file  Food Insecurity: Not on file  Transportation Needs: Not on file  Physical Activity: Not on file  Stress: Not on file  Social Connections: Not on file  Intimate Partner Violence: Not on file    Outpatient Medications Prior to Visit  Medication Sig Dispense Refill   buPROPion (WELLBUTRIN XL) 150 MG 24 hr tablet Take 1 tablet by mouth once daily. 30 tablet 0   DULoxetine (CYMBALTA) 60 MG capsule Take 1 capsule by mouth daily. 30 capsule 0   febuxostat (ULORIC) 40 MG tablet Take 1 tablet by mouth daily. 90 tablet 1   fluticasone (FLONASE) 50 MCG/ACT nasal spray Place 1-2 sprays into both nostrils daily. 16 g 0   indomethacin (INDOCIN) 50 MG capsule TAKE ONE CAPSULE BY MOUTH TWICE DAILY, WITH A MEAL AS NEEDED 90 capsule 3   labetalol (NORMODYNE) 300 MG tablet Take 1 tablet by mouth twice daily. **Please  schedule an appointment for further refills** 30 tablet 0   levothyroxine (SYNTHROID) 112 MCG tablet Take 1 tablet by mouth daily before breakfast. 30 tablet 0   metFORMIN (GLUCOPHAGE-XR) 500 MG 24 hr tablet Take 1 tablet (500 mg total) by mouth daily with breakfast. 90 tablet 1   NIFEdipine (PROCARDIA XL/NIFEDICAL XL) 60 MG 24 hr tablet Take 1 tablet (60 mg total) by mouth daily. LAST REFILL.MUST SCHEDULE AND KEEP APPOINTMENT FOR FUTURE REFILLS 90 tablet 0   No facility-administered medications prior to visit.    Allergies  Allergen Reactions   Imitrex [Sumatriptan]     Chest pain   Januvia [Sitagliptin] Other (See Comments)    Nausea, GI upset   Penicillins     Other reaction(s): Other migraines   Victoza [Liraglutide] Nausea Only   Colchicine Nausea Only   Trulicity [Dulaglutide] Nausea Only    Review of Systems     Objective:    Physical Exam  There were no vitals taken for this visit. Wt Readings from Last 3 Encounters:   01/25/21 252 lb 1.6 oz (114.4 kg)  09/14/20 250 lb (113.4 kg)  04/16/20 247 lb (112 kg)    Health Maintenance Due  Topic Date Due   COVID-19 Vaccine (3 - Booster for Moderna series) 12/31/2019   OPHTHALMOLOGY EXAM  11/18/2020   INFLUENZA VACCINE  03/07/2021   HEMOGLOBIN A1C  03/14/2021   URINE MICROALBUMIN  09/14/2021    There are no preventive care reminders to display for this patient.   Lab Results  Component Value Date   TSH 4.06 09/22/2020   Lab Results  Component Value Date   WBC 6.6 09/22/2020   HGB 12.4 09/22/2020   HCT 38.5 09/22/2020   MCV 86.7 09/22/2020   PLT 387 09/22/2020   Lab Results  Component Value Date   NA 141 09/22/2020   K 4.8 09/22/2020   CO2 29 09/22/2020   GLUCOSE 111 (H) 09/22/2020   BUN 13 09/22/2020   CREATININE 0.82 09/22/2020   BILITOT 0.4 09/22/2020   ALKPHOS 86 12/13/2019   AST 16 09/22/2020   ALT 15 09/22/2020   PROT 6.3 09/22/2020   ALBUMIN 1.9 (L) 12/13/2019   CALCIUM 9.5 09/22/2020   ANIONGAP 9 12/13/2019   Lab Results  Component Value Date   CHOL 224 (H) 09/22/2020   Lab Results  Component Value Date   HDL 39 (L) 09/22/2020   Lab Results  Component Value Date   LDLCALC 156 (H) 09/22/2020   Lab Results  Component Value Date   TRIG 155 (H) 09/22/2020   Lab Results  Component Value Date   CHOLHDL 5.7 (H) 09/22/2020   Lab Results  Component Value Date   HGBA1C 5.7 (A) 09/14/2020       Assessment & Plan:   Problem List Items Addressed This Visit   None    No orders of the defined types were placed in this encounter.    Beatrice Lecher, MD

## 2021-09-07 ENCOUNTER — Other Ambulatory Visit: Payer: Self-pay | Admitting: Family Medicine

## 2021-09-07 DIAGNOSIS — F3341 Major depressive disorder, recurrent, in partial remission: Secondary | ICD-10-CM

## 2021-09-08 ENCOUNTER — Telehealth: Payer: Self-pay | Admitting: Neurology

## 2021-09-08 DIAGNOSIS — F3341 Major depressive disorder, recurrent, in partial remission: Secondary | ICD-10-CM

## 2021-09-08 MED ORDER — DULOXETINE HCL 60 MG PO CPEP: ORAL_CAPSULE | ORAL | 0 refills | Status: DC

## 2021-09-08 MED ORDER — BUPROPION HCL ER (XL) 150 MG PO TB24: 150.0000 mg | ORAL_TABLET | Freq: Every day | ORAL | 0 refills | Status: DC

## 2021-09-08 MED ORDER — LEVOTHYROXINE SODIUM 112 MCG PO TABS: ORAL_TABLET | ORAL | 0 refills | Status: DC

## 2021-09-08 NOTE — Telephone Encounter (Signed)
Called and left voicemail for pt to schedule follow up.

## 2021-09-08 NOTE — Telephone Encounter (Signed)
Please call and schedule patient a follow up with Dr. Madilyn Fireman, it has been almost a year since she was seen. Received multiple refill requests for medications. 2 week supply sent to pharmacy.

## 2021-09-15 ENCOUNTER — Ambulatory Visit: Payer: BC Managed Care – PPO | Admitting: Family Medicine

## 2021-10-05 ENCOUNTER — Other Ambulatory Visit: Payer: Self-pay | Admitting: Family Medicine

## 2021-10-05 DIAGNOSIS — F3341 Major depressive disorder, recurrent, in partial remission: Secondary | ICD-10-CM

## 2021-10-11 ENCOUNTER — Ambulatory Visit: Payer: BC Managed Care – PPO | Admitting: Family Medicine

## 2021-10-17 ENCOUNTER — Other Ambulatory Visit: Payer: Self-pay

## 2021-10-17 DIAGNOSIS — E038 Other specified hypothyroidism: Secondary | ICD-10-CM

## 2021-10-17 DIAGNOSIS — F3341 Major depressive disorder, recurrent, in partial remission: Secondary | ICD-10-CM

## 2021-10-17 MED ORDER — DULOXETINE HCL 60 MG PO CPEP: ORAL_CAPSULE | ORAL | 0 refills | Status: DC

## 2021-10-17 MED ORDER — LEVOTHYROXINE SODIUM 112 MCG PO TABS: ORAL_TABLET | ORAL | 0 refills | Status: DC

## 2021-10-18 ENCOUNTER — Ambulatory Visit (INDEPENDENT_AMBULATORY_CARE_PROVIDER_SITE_OTHER): Payer: BC Managed Care – PPO | Admitting: Family Medicine

## 2021-10-18 ENCOUNTER — Encounter: Payer: Self-pay | Admitting: Family Medicine

## 2021-10-18 ENCOUNTER — Other Ambulatory Visit: Payer: Self-pay

## 2021-10-18 VITALS — BP 140/109 | HR 88 | Ht 63.0 in | Wt 248.0 lb

## 2021-10-18 DIAGNOSIS — H65191 Other acute nonsuppurative otitis media, right ear: Secondary | ICD-10-CM

## 2021-10-18 DIAGNOSIS — H9203 Otalgia, bilateral: Secondary | ICD-10-CM

## 2021-10-18 DIAGNOSIS — I10 Essential (primary) hypertension: Secondary | ICD-10-CM

## 2021-10-18 MED ORDER — PREDNISONE 20 MG PO TABS: 40.0000 mg | ORAL_TABLET | Freq: Every day | ORAL | 0 refills | Status: DC

## 2021-10-18 NOTE — Assessment & Plan Note (Addendum)
Blood pressure uncontrolled today.  She actually has a regularly scheduled appointment on Friday and we will recheck it at that time and make adjustments if needed. ?

## 2021-10-18 NOTE — Progress Notes (Signed)
? ?Acute Office Visit ? ?Subjective:  ? ? Patient ID: Brenda Sharp, female    DOB: [birth year], 41 y.o.   MRN: [id number] ? ?Chief Complaint  ?Patient presents with  ? Ear Pain  ? ? ?HPI ?Patient is in today for bilat ear pain.  Started hurting about 2 nights ago and then the left ear started hurting last night.  She is also had sneezing runny nose.  No significant congestion.  She has been taking some Robitussin.  No drainage from the ears no fever or chills. ? ?Past Medical History:  ?Diagnosis Date  ? Gout   ? Headache(784.0)   ? Hypertension   ? Hypothyroidism   ? Kidney stone   ? Miscarriage   ? PCOS (polycystic ovarian syndrome)   ? Tophi 2017  ? Left knee  ? Torticollis   ? treated with botox in 2016  ? ? ?Past Surgical History:  ?Procedure Laterality Date  ? CESAREAN SECTION N/A 12/11/2019  ? Procedure: CESAREAN SECTION;  Surgeon: Aloha Gell, MD;  Location: [facility];  Service: Obstetrics;  Laterality: N/A;  ? LITHOTRIPSY    ? OTHER SURGICAL HISTORY    ? done a procedure to rid of arthritis of the right big toe per patient   ? OTHER SURGICAL HISTORY Left 2017  ? gout related-tophi per patient   ? TONSILLECTOMY  1991  ? tophi removal  08/2016  ? left knee  ? tophi removal  07/2016  ? right great toe  ? [place] EXTRACTION  2006  ? ? ?Family History  ?Problem Relation Age of Onset  ? Hypertension Father   ? Gout Father   ? Depression Mother   ? Lung cancer Paternal Grandfather   ?     smoker  ? Heart attack Paternal Grandfather   ? Heart attack Paternal Grandmother   ? Heart attack Maternal Grandfather   ? Colon cancer Neg Hx   ? Esophageal cancer Neg Hx   ? ? ?Social History  ? ?Socioeconomic History  ? Marital status: Married  ?  Spouse name: Dannilynn Gallina  ? Number of children: 0  ? Years of education: college  ? Highest education level: Bachelor's degree (e.g., BA, AB, BS)  ?Occupational History  ? Occupation: Pharmacist, hospital  ?  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 Smoking status: Never  ? Smokeless tobacco:  Never  ?Vaping Use  ? Vaping Use: Never used  ?Substance and Sexual Activity  ? Alcohol use: No  ?  Alcohol/week: 0.0 standard drinks  ? Drug use: No  ? Sexual activity: Yes  ?  Partners: Male  ?Other Topics Concern  ? Not on file  ?Social History Narrative  ? 1 cup caffeine/day  ? Right-handed.  ? Lives at home with husband.  ? ?Social Determinants of Health  ? ?Financial Resource Strain: Not on file  ?Food Insecurity: Not on file  ?Transportation Needs: Not on file  ?Physical Activity: Not on file  ?Stress: Not on file  ?Social Connections: Not on file  ?Intimate Partner Violence: Not on file  ? ? ?Outpatient Medications Prior to Visit  ?Medication Sig Dispense Refill  ? buPROPion (WELLBUTRIN XL) 150 MG 24 hr tablet Take 1 tablet by mouth daily. **Please schedule an appointment for further refills** 15 tablet 0  ? DULoxetine (CYMBALTA) 60 MG capsule Take 1 capsule by mouth daily.**Please schedule appointment for further refills.** 7 capsule 0  ? febuxostat (ULORIC) 40 MG tablet Take 1 tablet  by mouth daily. 90 tablet 1  ? fluticasone (FLONASE) 50 MCG/ACT nasal spray Place 1-2 sprays into both nostrils daily. 16 g 0  ? indomethacin (INDOCIN) 50 MG capsule TAKE ONE CAPSULE BY MOUTH TWICE DAILY, WITH A MEAL AS NEEDED 90 capsule 3  ? labetalol (NORMODYNE) 300 MG tablet Take 1 tablet by mouth twice daily. **Please schedule an appointment for further refills** 30 tablet 0  ? levothyroxine (SYNTHROID) 112 MCG tablet Take 1 tablet by mouth daily before breakfast.**Need appointment for further refills.** 7 tablet 0  ? metFORMIN (GLUCOPHAGE-XR) 500 MG 24 hr tablet Take 1 tablet (500 mg total) by mouth daily with breakfast. 90 tablet 1  ? NIFEdipine (PROCARDIA XL/NIFEDICAL XL) 60 MG 24 hr tablet Take 1 tablet (60 mg total) by mouth daily. LAST REFILL.MUST SCHEDULE AND KEEP APPOINTMENT FOR FUTURE REFILLS 90 tablet 0  ? ?No facility-administered medications prior to visit.  ? ? ?Allergies  ?Allergen Reactions  ? Imitrex  [Sumatriptan]   ?  Chest pain  ? Januvia [Sitagliptin] Other (See Comments)  ?  Nausea, GI upset  ? Penicillins   ?  Other reaction(s): Other ?migraines  ? Victoza [Liraglutide] Nausea Only  ? Colchicine Nausea Only  ? Trulicity [Dulaglutide] Nausea Only  ? ? ?Review of Systems ? ?   ?Objective:  ?  ?Physical Exam ?Constitutional:   ?   Appearance: She is well-developed.  ?HENT:  ?   Head: Normocephalic and atraumatic.  ?   Comments: Right TM with a full effusion behind the eardrum absence of the light reflex.  But no significant erythema.  Left TM and canal are clear. ?   Right Ear: Tympanic membrane and external ear normal.  ?   Left Ear: External ear normal.  ?   Nose: Nose normal.  ?Eyes:  ?   Conjunctiva/sclera: Conjunctivae normal.  ?   Pupils: Pupils are equal, round, and reactive to light.  ?Neck:  ?   Thyroid: No thyromegaly.  ?Cardiovascular:  ?   Rate and Rhythm: Normal rate and regular rhythm.  ?   Heart sounds: Normal heart sounds.  ?Pulmonary:  ?   Effort: Pulmonary effort is normal.  ?   Breath sounds: Normal breath sounds. No wheezing.  ?Musculoskeletal:  ?   Cervical back: Neck supple.  ?Lymphadenopathy:  ?   Cervical: No cervical adenopathy.  ?Skin: ?   General: Skin is warm and dry.  ?Neurological:  ?   Mental Status: She is alert and oriented to person, place, and time.  ? ? ?BP (!) 140/109   Pulse 88   Ht '5\' 3"'$  (1.6 m)   Wt 248 lb (112.5 kg)   SpO2 99%   BMI 43.93 kg/m?  ?Wt Readings from Last 3 Encounters:  ?10/18/21 248 lb (112.5 kg)  ?01/25/21 252 lb 1.6 oz (114.4 kg)  ?09/14/20 250 lb (113.4 kg)  ? ? ?Health Maintenance Due  ?Topic Date Due  ? COVID-19 Vaccine (3 - Booster for Moderna series) 12/31/2019  ? OPHTHALMOLOGY EXAM  11/18/2020  ? INFLUENZA VACCINE  03/07/2021  ? HEMOGLOBIN A1C  03/14/2021  ? FOOT EXAM  09/14/2021  ? URINE MICROALBUMIN  09/14/2021  ? ? ?There are no preventive care reminders to display for this patient. ? ? ?Lab Results  ?Component Value Date  ? TSH 4.06  09/22/2020  ? ?Lab Results  ?Component Value Date  ? WBC 6.6 09/22/2020  ? HGB 12.4 09/22/2020  ? HCT 38.5 09/22/2020  ? MCV 86.7 09/22/2020  ?  PLT 387 09/22/2020  ? ?Lab Results  ?Component Value Date  ? NA 141 09/22/2020  ? K 4.8 09/22/2020  ? CO2 29 09/22/2020  ? GLUCOSE 111 (H) 09/22/2020  ? BUN 13 09/22/2020  ? CREATININE 0.82 09/22/2020  ? BILITOT 0.4 09/22/2020  ? ALKPHOS 86 12/13/2019  ? AST 16 09/22/2020  ? ALT 15 09/22/2020  ? PROT 6.3 09/22/2020  ? ALBUMIN 1.9 (L) 12/13/2019  ? CALCIUM 9.5 09/22/2020  ? ANIONGAP 9 12/13/2019  ? ?Lab Results  ?Component Value Date  ? CHOL 224 (H) 09/22/2020  ? ?Lab Results  ?Component Value Date  ? HDL 39 (L) 09/22/2020  ? ?Lab Results  ?Component Value Date  ? LDLCALC 156 (H) 09/22/2020  ? ?Lab Results  ?Component Value Date  ? TRIG 155 (H) 09/22/2020  ? ?Lab Results  ?Component Value Date  ? CHOLHDL 5.7 (H) 09/22/2020  ? ?Lab Results  ?Component Value Date  ? HGBA1C 5.7 (A) 09/14/2020  ? ? ?   ?Assessment & Plan:  ? ?Problem List Items Addressed This Visit   ? ?  ? Cardiovascular and Mediastinum  ? Essential hypertension, benign  ?  Blood pressure uncontrolled today.  She actually has a regularly scheduled appointment on Friday and we will recheck it at that time and make adjustments if needed. ?  ?  ? ?Other Visit Diagnoses   ? ? Acute ear pain, bilateral    -  Primary  ? Acute effusion of right ear      ? ?  ? ?Bilateral ear pain with right ear effusion.  We will treat with oral prednisone.  If not improving in the next few days then consider adding an antibiotic.  I suspect this is likely viral. ? ?Meds ordered this encounter  ?Medications  ? predniSONE (DELTASONE) 20 MG tablet  ?  Sig: Take 2 tablets (40 mg total) by mouth daily with breakfast.  ?  Dispense:  10 tablet  ?  Refill:  0  ? ? ? ?Beatrice Lecher, MD ? ?

## 2021-10-21 ENCOUNTER — Other Ambulatory Visit: Payer: Self-pay | Admitting: Family Medicine

## 2021-10-21 ENCOUNTER — Other Ambulatory Visit: Payer: Self-pay

## 2021-10-21 ENCOUNTER — Encounter: Payer: Self-pay | Admitting: Family Medicine

## 2021-10-21 ENCOUNTER — Ambulatory Visit (INDEPENDENT_AMBULATORY_CARE_PROVIDER_SITE_OTHER): Payer: BC Managed Care – PPO | Admitting: Family Medicine

## 2021-10-21 VITALS — BP 132/89 | HR 106 | Resp 18 | Ht 63.0 in | Wt 253.0 lb

## 2021-10-21 DIAGNOSIS — E038 Other specified hypothyroidism: Secondary | ICD-10-CM

## 2021-10-21 DIAGNOSIS — F3341 Major depressive disorder, recurrent, in partial remission: Secondary | ICD-10-CM

## 2021-10-21 DIAGNOSIS — E782 Mixed hyperlipidemia: Secondary | ICD-10-CM

## 2021-10-21 DIAGNOSIS — E118 Type 2 diabetes mellitus with unspecified complications: Secondary | ICD-10-CM | POA: Diagnosis not present

## 2021-10-21 DIAGNOSIS — M1A0621 Idiopathic chronic gout, left knee, with tophus (tophi): Secondary | ICD-10-CM

## 2021-10-21 DIAGNOSIS — E088 Diabetes mellitus due to underlying condition with unspecified complications: Secondary | ICD-10-CM

## 2021-10-21 DIAGNOSIS — I10 Essential (primary) hypertension: Secondary | ICD-10-CM

## 2021-10-21 DIAGNOSIS — M1A0721 Idiopathic chronic gout, left ankle and foot, with tophus (tophi): Secondary | ICD-10-CM

## 2021-10-21 DIAGNOSIS — J019 Acute sinusitis, unspecified: Secondary | ICD-10-CM

## 2021-10-21 LAB — POCT GLYCOSYLATED HEMOGLOBIN (HGB A1C): Hemoglobin A1C: 6.3 % — AB (ref 4.0–5.6)

## 2021-10-21 LAB — POCT UA - MICROALBUMIN

## 2021-10-21 MED ORDER — AZITHROMYCIN 250 MG PO TABS: ORAL_TABLET | ORAL | 0 refills | Status: AC

## 2021-10-21 MED ORDER — NIFEDIPINE ER OSMOTIC RELEASE 60 MG PO TB24: 60.0000 mg | ORAL_TABLET | Freq: Every day | ORAL | 3 refills | Status: DC

## 2021-10-21 MED ORDER — DULOXETINE HCL 60 MG PO CPEP: ORAL_CAPSULE | ORAL | 1 refills | Status: DC

## 2021-10-21 MED ORDER — BUPROPION HCL ER (XL) 150 MG PO TB24: ORAL_TABLET | ORAL | 3 refills | Status: DC

## 2021-10-21 MED ORDER — LABETALOL HCL 300 MG PO TABS: ORAL_TABLET | ORAL | 3 refills | Status: DC

## 2021-10-21 MED ORDER — LEVOTHYROXINE SODIUM 112 MCG PO TABS: ORAL_TABLET | ORAL | 1 refills | Status: DC

## 2021-10-21 MED ORDER — FEBUXOSTAT 80 MG PO TABS: 1.0000 | ORAL_TABLET | Freq: Every day | ORAL | 3 refills | Status: DC

## 2021-10-21 NOTE — Assessment & Plan Note (Signed)
A1c went up just slightly from previous but still at goal.  Just continue to work on healthy diet and regular exercise. ?

## 2021-10-21 NOTE — Progress Notes (Signed)
? ?Established Patient Office Visit ? ?Subjective:  ?Patient ID: Brenda Sharp, female    DOB: 1981-06-12  Age: 41 y.o. MRN: [id number] ? ?CC:  ?Chief Complaint  ?Patient presents with  ? Diabetes  ?  Follow up   ? Ear Pain  ?  Right ear still feels muffled.   ? Hypertension  ?  Follow up   ? Diabetes Eye Exam  ?  Patient will schedule appointment with [facility],   ? ? ?HPI ?Brenda Sharp presents for  ? ?Hypertension- Pt denies chest pain, SOB, dizziness, or heart palpitations.  Taking meds as directed w/o problems.  Denies medication side effects.   ? ?Diabetes - no hypoglycemic events. No wounds or sores that are not healing well. No increased thirst or urination. Checking glucose at home. Taking medications as prescribed without any side effects. ? ?F/U Gout -she would like to discuss possibly increasing the Uloric.  Been having a flare in her left toe. Has had 2 major flares in the last 6 months.  She says they are lasting her.  The joint will stay sore for most 2 weeks.  And she does not seem to be responding to the indomethacin anymore.  She is also already tried colchicine and even prednisone in the past. ? ?She feels like she is also doing really well with her Cymbalta but does need refills.  She is feels stable on her current regimen. ? ?She was seen 3 days ago for bilateral ear pain and feeling of fullness.  She still has not some nasal congestion.  We treated with prednisone which she has been on for about 3 days but feels like she is not really getting any relief in regards to the fullness but the pain is actually better but she still having a lot of nasal congestion and pressure ? ?Past Medical History:  ?Diagnosis Date  ? Gout   ? Headache(784.0)   ? Hypertension   ? Hypothyroidism   ? Kidney stone   ? Miscarriage   ? PCOS (polycystic ovarian syndrome)   ? Tophi 2017  ? Left knee  ? Torticollis   ? treated with botox in 2016  ? ? ?Past Surgical History:  ?Procedure Laterality Date  ? CESAREAN  SECTION N/A 12/11/2019  ? Procedure: CESAREAN SECTION;  Surgeon: Aloha Gell, MD;  Location: [facility];  Service: Obstetrics;  Laterality: N/A;  ? LITHOTRIPSY    ? OTHER SURGICAL HISTORY    ? done a procedure to rid of arthritis of the right big toe per patient   ? OTHER SURGICAL HISTORY Left 2017  ? gout related-tophi per patient   ? TONSILLECTOMY  1991  ? tophi removal  08/2016  ? left knee  ? tophi removal  07/2016  ? right great toe  ? [place] EXTRACTION  2006  ? ? ?Family History  ?Problem Relation Age of Onset  ? Hypertension Father   ? Gout Father   ? Depression Mother   ? Lung cancer Paternal Grandfather   ?     smoker  ? Heart attack Paternal Grandfather   ? Heart attack Paternal Grandmother   ? Heart attack Maternal Grandfather   ? Colon cancer Neg Hx   ? Esophageal cancer Neg Hx   ? ? ?Social History  ? ?Socioeconomic History  ? Marital status: Married  ?  Spouse name: Genean Adamski  ? Number of children: 0  ? Years of education: college  ?  Highest education level: Bachelor's degree (e.g., BA, AB, BS)  ?Occupational History  ? Occupation: Pharmacist, hospital  ?  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 Smoking status: Never  ? Smokeless tobacco: Never  ?Vaping Use  ? Vaping Use: Never used  ?Substance and Sexual Activity  ? Alcohol use: No  ?  Alcohol/week: 0.0 standard drinks  ? Drug use: No  ? Sexual activity: Yes  ?  Partners: Male  ?Other Topics Concern  ? Not on file  ?Social History Narrative  ? 1 cup caffeine/day  ? Right-handed.  ? Lives at home with husband.  ? ?Social Determinants of Health  ? ?Financial Resource Strain: Not on file  ?Food Insecurity: Not on file  ?Transportation Needs: Not on file  ?Physical Activity: Not on file  ?Stress: Not on file  ?Social Connections: Not on file  ?Intimate Partner Violence: Not on file  ? ? ?Outpatient Medications Prior to Visit  ?Medication Sig Dispense Refill  ? fluticasone (FLONASE) 50 MCG/ACT nasal spray Place 1-2 sprays into both nostrils daily. 16 g 0  ?  indomethacin (INDOCIN) 50 MG capsule TAKE ONE CAPSULE BY MOUTH TWICE DAILY, WITH A MEAL AS NEEDED 90 capsule 3  ? metFORMIN (GLUCOPHAGE-XR) 500 MG 24 hr tablet Take 1 tablet (500 mg total) by mouth daily with breakfast. 90 tablet 1  ? buPROPion (WELLBUTRIN XL) 150 MG 24 hr tablet Take 1 tablet by mouth daily. **Please schedule an appointment for further refills** 15 tablet 0  ? DULoxetine (CYMBALTA) 60 MG capsule Take 1 capsule by mouth daily.**Please schedule appointment for further refills.** 7 capsule 0  ? febuxostat (ULORIC) 40 MG tablet Take 1 tablet by mouth daily. 90 tablet 1  ? labetalol (NORMODYNE) 300 MG tablet Take 1 tablet by mouth twice daily. **Please schedule an appointment for further refills** 30 tablet 0  ? levothyroxine (SYNTHROID) 112 MCG tablet Take 1 tablet by mouth daily before breakfast.**Need appointment for further refills.** 7 tablet 0  ? NIFEdipine (PROCARDIA XL/NIFEDICAL XL) 60 MG 24 hr tablet Take 1 tablet (60 mg total) by mouth daily. LAST REFILL.MUST SCHEDULE AND KEEP APPOINTMENT FOR FUTURE REFILLS 90 tablet 0  ? predniSONE (DELTASONE) 20 MG tablet Take 2 tablets (40 mg total) by mouth daily with breakfast. 10 tablet 0  ? ?No facility-administered medications prior to visit.  ? ? ?Allergies  ?Allergen Reactions  ? Imitrex [Sumatriptan]   ?  Chest pain  ? Januvia [Sitagliptin] Other (See Comments)  ?  Nausea, GI upset  ? Penicillins   ?  Other reaction(s): Other ?migraines  ? Victoza [Liraglutide] Nausea Only  ? Colchicine Nausea Only  ? Trulicity [Dulaglutide] Nausea Only  ? ? ?ROS ?Review of Systems ? ?  ?Objective:  ?  ?Physical Exam ?Constitutional:   ?   Appearance: Normal appearance. She is well-developed.  ?HENT:  ?   Head: Normocephalic and atraumatic.  ?   Right Ear: Tympanic membrane and ear canal normal.  ?   Left Ear: Tympanic membrane and ear canal normal.  ?   Nose: Nose normal.  ?   Mouth/Throat:  ?   Mouth: Mucous membranes are moist.  ?Eyes:  ?   Conjunctiva/sclera:  Conjunctivae normal.  ?Cardiovascular:  ?   Rate and Rhythm: Regular rhythm. Tachycardia present.  ?   Heart sounds: Normal heart sounds.  ?Pulmonary:  ?   Effort: Pulmonary effort is normal.  ?   Breath sounds: Normal breath sounds.  ?Musculoskeletal:  ?  Cervical back: No tenderness.  ?Skin: ?   General: Skin is warm and dry.  ?Neurological:  ?   Mental Status: She is alert and oriented to person, place, and time.  ?Psychiatric:     ?   Behavior: Behavior normal.  ? ? ?BP 132/89 (BP Location: Left Arm)   Pulse (!) 106   Resp 18   Ht '5\' 3"'$  (1.6 m)   Wt 253 lb (114.8 kg)   LMP 10/05/2021   SpO2 96%   BMI 44.82 kg/m?  ?Wt Readings from Last 3 Encounters:  ?10/21/21 253 lb (114.8 kg)  ?10/18/21 248 lb (112.5 kg)  ?01/25/21 252 lb 1.6 oz (114.4 kg)  ? ? ? ?Health Maintenance Due  ?Topic Date Due  ? OPHTHALMOLOGY EXAM  11/18/2020  ? ? ?There are no preventive care reminders to display for this patient. ? ?Lab Results  ?Component Value Date  ? TSH 4.06 09/22/2020  ? ?Lab Results  ?Component Value Date  ? WBC 6.6 09/22/2020  ? HGB 12.4 09/22/2020  ? HCT 38.5 09/22/2020  ? MCV 86.7 09/22/2020  ? PLT 387 09/22/2020  ? ?Lab Results  ?Component Value Date  ? NA 141 09/22/2020  ? K 4.8 09/22/2020  ? CO2 29 09/22/2020  ? GLUCOSE 111 (H) 09/22/2020  ? BUN 13 09/22/2020  ? CREATININE 0.82 09/22/2020  ? BILITOT 0.4 09/22/2020  ? ALKPHOS 86 12/13/2019  ? AST 16 09/22/2020  ? ALT 15 09/22/2020  ? PROT 6.3 09/22/2020  ? ALBUMIN 1.9 (L) 12/13/2019  ? CALCIUM 9.5 09/22/2020  ? ANIONGAP 9 12/13/2019  ? ?Lab Results  ?Component Value Date  ? CHOL 224 (H) 09/22/2020  ? ?Lab Results  ?Component Value Date  ? HDL 39 (L) 09/22/2020  ? ?Lab Results  ?Component Value Date  ? LDLCALC 156 (H) 09/22/2020  ? ?Lab Results  ?Component Value Date  ? TRIG 155 (H) 09/22/2020  ? ?Lab Results  ?Component Value Date  ? CHOLHDL 5.7 (H) 09/22/2020  ? ?Lab Results  ?Component Value Date  ? HGBA1C 6.3 (A) 10/21/2021  ? ? ?  ?Assessment & Plan:   ? ?Problem List Items Addressed This Visit   ? ?  ? Cardiovascular and Mediastinum  ? Essential hypertension, benign - Primary  ?  Repeat blood pressure was improved but not ideal.  For now she is not preventing pregnancy so we

## 2021-10-21 NOTE — Assessment & Plan Note (Addendum)
Stable on Cymbalta and Wellbutrin.  Refills sent to pharmacy. ?

## 2021-10-21 NOTE — Assessment & Plan Note (Signed)
Gout seems to not be well controlled we will check uric acid level to see if she is therapeutic.  Also plan on increasing Uloric dose. ?

## 2021-10-21 NOTE — Assessment & Plan Note (Signed)
We will go ahead and increase Uloric to 80 mg.  We will also check uric acid level we will call with results once available. ?

## 2021-10-21 NOTE — Assessment & Plan Note (Signed)
Due for thyroid check.  ?

## 2021-10-21 NOTE — Assessment & Plan Note (Addendum)
Repeat blood pressure was improved but not ideal.  For now she is not preventing pregnancy so we will keep her on the feta pain but at some point I would like to consider switching her to amlodipine.  Continue current regimen. Follow up in  6 months.  ?

## 2021-10-22 LAB — COMPLETE METABOLIC PANEL WITH GFR
AG Ratio: 1.9 (calc) (ref 1.0–2.5)
ALT: 11 U/L (ref 6–29)
AST: 9 U/L — ABNORMAL LOW (ref 10–30)
Albumin: 4.2 g/dL (ref 3.6–5.1)
Alkaline phosphatase (APISO): 69 U/L (ref 31–125)
BUN: 22 mg/dL (ref 7–25)
CO2: 26 mmol/L (ref 20–32)
Calcium: 10.1 mg/dL (ref 8.6–10.2)
Chloride: 107 mmol/L (ref 98–110)
Creat: 0.93 mg/dL (ref 0.50–0.97)
Globulin: 2.2 g/dL (calc) (ref 1.9–3.7)
Glucose, Bld: 89 mg/dL (ref 65–139)
Potassium: 4 mmol/L (ref 3.5–5.3)
Sodium: 142 mmol/L (ref 135–146)
Total Bilirubin: 0.2 mg/dL (ref 0.2–1.2)
Total Protein: 6.4 g/dL (ref 6.1–8.1)
eGFR: 81 mL/min/{1.73_m2} (ref >=60)

## 2021-10-22 LAB — LIPID PANEL
Cholesterol: 237 mg/dL — ABNORMAL HIGH (ref <200)
HDL: 37 mg/dL — ABNORMAL LOW (ref >=50)
LDL Cholesterol (Calc): 153 mg/dL (calc) — ABNORMAL HIGH
Non-HDL Cholesterol (Calc): 200 mg/dL (calc) — ABNORMAL HIGH (ref <130)
Total CHOL/HDL Ratio: 6.4 (calc) — ABNORMAL HIGH (ref <5.0)
Triglycerides: 284 mg/dL — ABNORMAL HIGH (ref <150)

## 2021-10-22 LAB — CBC
HCT: 37.1 % (ref 35.0–45.0)
Hemoglobin: 11.8 g/dL (ref 11.7–15.5)
MCH: 25 pg — ABNORMAL LOW (ref 27.0–33.0)
MCHC: 31.8 g/dL — ABNORMAL LOW (ref 32.0–36.0)
MCV: 78.6 fL — ABNORMAL LOW (ref 80.0–100.0)
MPV: 9.4 fL (ref 7.5–12.5)
Platelets: 447 10*3/uL — ABNORMAL HIGH (ref 140–400)
RBC: 4.72 10*6/uL (ref 3.80–5.10)
RDW: 15.2 % — ABNORMAL HIGH (ref 11.0–15.0)
WBC: 12.8 10*3/uL — ABNORMAL HIGH (ref 3.8–10.8)

## 2021-10-22 LAB — TSH: TSH: 1.35 mIU/L

## 2021-10-22 LAB — URIC ACID: Uric Acid, Serum: 7.2 mg/dL — ABNORMAL HIGH (ref 2.5–7.0)

## 2021-10-24 NOTE — Progress Notes (Signed)
Hi Brenda Sharp, hemoglobin looks good at 11.8 though the red blood cells are still small.  We will call the lab and see if we can add an iron panel.  Your metabolic panel is normal.  Kidney function looks good.  Uric acid is definitely up.  So glad were going to go ahead and go up on the Uloric to 80 mg I think this to be helpful.  DL and triglycerides are high.  Thyroid looks perfect. ? ?The ASCVD Risk score (Arnett DK, et al., 2019) failed to calculate for the following reasons: ?  The 2019 ASCVD risk score is only valid for ages 48 to 40 ?

## 2021-11-06 ENCOUNTER — Other Ambulatory Visit: Payer: Self-pay

## 2021-11-06 ENCOUNTER — Emergency Department (HOSPITAL_BASED_OUTPATIENT_CLINIC_OR_DEPARTMENT_OTHER)
Admission: EM | Admit: 2021-11-06 | Discharge: 2021-11-06 | Disposition: A | Discharge status: Home / Self Care | Payer: BC Managed Care – PPO | Attending: Emergency Medicine | Admitting: Emergency Medicine

## 2021-11-06 ENCOUNTER — Emergency Department (HOSPITAL_BASED_OUTPATIENT_CLINIC_OR_DEPARTMENT_OTHER): Payer: BC Managed Care – PPO

## 2021-11-06 ENCOUNTER — Encounter (HOSPITAL_BASED_OUTPATIENT_CLINIC_OR_DEPARTMENT_OTHER): Payer: Self-pay | Admitting: Emergency Medicine

## 2021-11-06 ENCOUNTER — Encounter: Payer: Self-pay | Admitting: Family Medicine

## 2021-11-06 DIAGNOSIS — N202 Calculus of kidney with calculus of ureter: Secondary | ICD-10-CM | POA: Insufficient documentation

## 2021-11-06 DIAGNOSIS — N200 Calculus of kidney: Secondary | ICD-10-CM

## 2021-11-06 DIAGNOSIS — N23 Unspecified renal colic: Secondary | ICD-10-CM

## 2021-11-06 DIAGNOSIS — R1031 Right lower quadrant pain: Secondary | ICD-10-CM | POA: Diagnosis present

## 2021-11-06 LAB — PREGNANCY, URINE: Preg Test, Ur: NEGATIVE

## 2021-11-06 LAB — URINALYSIS, ROUTINE W REFLEX MICROSCOPIC
Bilirubin Urine: NEGATIVE
Glucose, UA: NEGATIVE mg/dL
Ketones, ur: NEGATIVE mg/dL
Leukocytes,Ua: NEGATIVE
Nitrite: NEGATIVE
Protein, ur: NEGATIVE mg/dL
Specific Gravity, Urine: >=1.03 (ref 1.005–1.030)
pH: 6 (ref 5.0–8.0)

## 2021-11-06 LAB — URINALYSIS, MICROSCOPIC (REFLEX)

## 2021-11-06 MED ORDER — IBUPROFEN 800 MG PO TABS
800.0000 mg | ORAL_TABLET | Freq: Once | ORAL | Status: AC
  Administered 2021-11-06: 800 mg via ORAL
  Filled 2021-11-06: qty 1

## 2021-11-06 MED ORDER — ONDANSETRON 4 MG PO TBDP
4.0000 mg | ORAL_TABLET | Freq: Once | ORAL | Status: AC
  Administered 2021-11-06: 4 mg via ORAL
  Filled 2021-11-06: qty 1

## 2021-11-06 MED ORDER — OXYCODONE HCL 5 MG PO TABS
5.0000 mg | ORAL_TABLET | Freq: Once | ORAL | Status: AC
  Administered 2021-11-06: 5 mg via ORAL
  Filled 2021-11-06: qty 1

## 2021-11-06 MED ORDER — HYDROCODONE-ACETAMINOPHEN 5-325 MG PO TABS: 1.0000 | ORAL_TABLET | Freq: Four times a day (QID) | ORAL | 0 refills | Status: DC | PRN

## 2021-11-06 MED ORDER — ONDANSETRON 4 MG PO TBDP: 4.0000 mg | ORAL_TABLET | Freq: Three times a day (TID) | ORAL | 0 refills | Status: DC | PRN

## 2021-11-06 MED ORDER — IBUPROFEN 600 MG PO TABS: 600.0000 mg | ORAL_TABLET | Freq: Four times a day (QID) | ORAL | 0 refills | Status: DC | PRN

## 2021-11-06 MED ORDER — TAMSULOSIN HCL 0.4 MG PO CAPS
0.4000 mg | ORAL_CAPSULE | Freq: Every day | ORAL | 0 refills | Status: AC
Start: 2021-11-06 — End: 2021-11-21

## 2021-11-06 NOTE — ED Notes (Signed)
Pt ambulatory to waiting room. Pt verbalized understanding of discharge instructions.   

## 2021-11-06 NOTE — Discharge Instructions (Addendum)
?  You are currently passing a 5 mm kidney stone on the right side.  Drink plenty of water at home.  You can take ibuprofen for pain, Flomax to help you push water out to the kidneys.  Please save the oxycodone prescription for more severe pain.  Zofran was also prescribed for nausea. ? ?Your blood pressure was quite high today in the ER.  This is likely due to the fact that you are in pain.  Please have your doctor's office recheck your blood pressure once you have passed a kidney stone. ? ?If you are taking ibuprofen, you should avoid using any other NSAIDs such as Aleve or indomethacin ?

## 2021-11-06 NOTE — ED Triage Notes (Signed)
Pt reports right sided flank pain that radiates to her RLQ. Pt reports the pain started yesterday. Pt reports passing kidney stone. Denies fever and vomiting. ?

## 2021-11-06 NOTE — ED Provider Notes (Signed)
?[facility] ?Provider Note ? ? ?CSN: [id number] ?Arrival date & time: 11/06/21  9798 ? ?  ? ?History ? ?Chief Complaint  ?Patient presents with  ? Flank Pain  ? ? ?Brenda Sharp is a 41 y.o. female presenting emergency department right-sided flank pain.  Patient reports that this pain began yesterday evening suddenly and sharp, with dysuria and urinary pressure.  She passed a small kidney stone around midnight last night, but continues having pain into this morning which is worsening.  She has a history of kidney stones.  She took a negative home pregnancy test yesterday evening.   ? ?HPI ? ?  ? ?Home Medications ?Prior to Admission medications   ?Medication Sig Start Date End Date Taking? Authorizing Provider  ?HYDROcodone-acetaminophen (NORCO/VICODIN) 5-325 MG tablet Take 1 tablet by mouth every 6 (six) hours as needed for up to 10 doses for severe pain. 11/06/21  Yes Teo Moede, Carola Rhine, MD  ?ibuprofen (ADVIL) 600 MG tablet Take 1 tablet (600 mg total) by mouth every 6 (six) hours as needed for up to 30 doses for moderate pain or mild pain. 11/06/21  Yes Arien Benincasa, Carola Rhine, MD  ?ondansetron (ZOFRAN-ODT) 4 MG disintegrating tablet Take 1 tablet (4 mg total) by mouth every 8 (eight) hours as needed for up to 10 doses for nausea or vomiting. 11/06/21  Yes Alegria Dominique, Carola Rhine, MD  ?tamsulosin (FLOMAX) 0.4 MG CAPS capsule Take 1 capsule (0.4 mg total) by mouth daily after breakfast for 15 doses. 11/06/21 11/21/21 Yes Shemeika Starzyk, Carola Rhine, MD  ?buPROPion (WELLBUTRIN XL) 150 MG 24 hr tablet Take 1 tablet by mouth daily. 10/21/21   Hali Marry, MD  ?DULoxetine (CYMBALTA) 60 MG capsule Take 1 capsule by mouth daily. 10/21/21   Hali Marry, MD  ?Febuxostat 80 MG TABS Take 1 tablet (80 mg total) by mouth daily. 10/21/21   Hali Marry, MD  ?fluticasone (FLONASE) 50 MCG/ACT nasal spray Place 1-2 sprays into both nostrils daily. 01/25/21   Trixie Dredge, PA-C  ?indomethacin  (INDOCIN) 50 MG capsule TAKE ONE CAPSULE BY MOUTH TWICE DAILY, WITH A MEAL AS NEEDED 05/18/20   Hali Marry, MD  ?labetalol (NORMODYNE) 300 MG tablet Take 1 tablet by mouth twice daily. 10/21/21   Hali Marry, MD  ?levothyroxine (SYNTHROID) 112 MCG tablet Take 1 tablet by mouth daily before breakfast. 10/21/21   Hali Marry, MD  ?metFORMIN (GLUCOPHAGE-XR) 500 MG 24 hr tablet Take 1 tablet (500 mg total) by mouth daily with breakfast. 04/16/20   Hali Marry, MD  ?NIFEdipine (PROCARDIA XL/NIFEDICAL XL) 60 MG 24 hr tablet Take 1 tablet (60 mg total) by mouth daily. 10/21/21   Hali Marry, MD  ?   ? ?Allergies    ?Imitrex [sumatriptan], Januvia [sitagliptin], Penicillins, Victoza [liraglutide], Colchicine, and Trulicity [dulaglutide]   ? ?Review of Systems   ?Review of Systems ? ?Physical Exam ?Updated Vital Signs ?BP (!) 160/110   Pulse 87   Temp 98.5 ?F (36.9 ?C)   Resp 18   SpO2 99%  ?Physical Exam ?Constitutional:   ?   General: She is not in acute distress. ?HENT:  ?   Head: Normocephalic and atraumatic.  ?Eyes:  ?   Conjunctiva/sclera: Conjunctivae normal.  ?   Pupils: Pupils are equal, round, and reactive to light.  ?Cardiovascular:  ?   Rate and Rhythm: Normal rate and regular rhythm.  ?Pulmonary:  ?   Effort: Pulmonary effort is normal.  No respiratory distress.  ?Skin: ?   General: Skin is warm and dry.  ?Neurological:  ?   General: No focal deficit present.  ?   Mental Status: She is alert. Mental status is at baseline.  ? ? ?ED Results / Procedures / Treatments   ?Labs ?(all labs ordered are listed, but only abnormal results are displayed) ?Labs Reviewed  ?URINALYSIS, ROUTINE W REFLEX MICROSCOPIC - Abnormal; Notable for the following components:  ?    Result Value  ? Hgb urine dipstick MODERATE (*)   ? All other components within normal limits  ?URINALYSIS, MICROSCOPIC (REFLEX) - Abnormal; Notable for the following components:  ? Bacteria, UA FEW (*)   ? All  other components within normal limits  ?PREGNANCY, URINE  ? ? ?EKG ?None ? ?Radiology ?CT Renal Stone Study ? ?Result Date: 11/06/2021 ?CLINICAL DATA:  Flank pain.  Rule out kidney stone. EXAM: CT ABDOMEN AND PELVIS WITHOUT CONTRAST TECHNIQUE: Multidetector CT imaging of the abdomen and pelvis was performed following the standard protocol without IV contrast. RADIATION DOSE REDUCTION: This exam was performed according to the departmental dose-optimization program which includes automated exposure control, adjustment of the mA and/or kV according to patient size and/or use of iterative reconstruction technique. COMPARISON:  08/08/2018 FINDINGS: Lower chest: No acute abnormality. Hepatobiliary: No focal liver abnormality is seen. No gallstones, gallbladder wall thickening, or biliary dilatation. Pancreas: Unremarkable. No pancreatic ductal dilatation or surrounding inflammatory changes. Spleen: Normal in size without focal abnormality. Adrenals/Urinary Tract: Normal appearance of the adrenal glands. Several tiny stones noted within the upper pole of the left kidney which measure 2-3 mm. No left-sided hydronephrosis or mass. There is right-sided nephromegaly, perinephric fat stranding and hydronephrosis. Hydroureter scratch set right-sided hydroureter is identified. Within the distal right ureter there is a stone measuring 5 mm, image 42/4. Bladder unremarkable. Stomach/Bowel: Small hiatal hernia. No bowel wall thickening, inflammation, or distension. Vascular/Lymphatic: No significant vascular findings are present. No enlarged abdominal or pelvic lymph nodes. Reproductive: Uterus and bilateral adnexa are unremarkable. Other: No free fluid or fluid collections. Musculoskeletal: No acute or significant osseous findings. IMPRESSION: 1. Right-sided hydronephrosis and hydroureter secondary to 5 mm distal right ureteral calculus. 2. Small hiatal hernia. Electronically Signed   By: Kerby Moors M.D.   On: 11/06/2021 10:26    ? ?Procedures ?Procedures  ? ? ?Medications Ordered in ED ?Medications  ?oxyCODONE (Oxy IR/ROXICODONE) immediate release tablet 5 mg (5 mg Oral Given 11/06/21 0835)  ?ibuprofen (ADVIL) tablet 800 mg (800 mg Oral Given 11/06/21 0835)  ?ondansetron (ZOFRAN-ODT) disintegrating tablet 4 mg (4 mg Oral Given 11/06/21 0835)  ? ? ?ED Course/ Medical Decision Making/ A&P ?Clinical Course as of 11/06/21 1105  ?Sun Nov 06, 2021  ?1034 1. Right-sided hydronephrosis and hydroureter secondary to 5 mm distal right ureteral calculus. ?2. Small hiatal hernia. [MT]  ?1038 Patient updated regarding kidney stone, likely to pass within the next day or 2 given that it is already traveled most of the ureteral distance.  I will prescribe her pain medicine, Flomax, Motrin.  We did discuss her high blood pressure is likely secondary due to pain, but advised to recheck it after she passes her stone.  She verbalized understanding.  Okay for discharge [MT]  ?  ?Clinical Course User Index ?[MT] Wyvonnia Dusky, MD  ? ?                        ?Medical Decision Making ?Amount and/or  Complexity of Data Reviewed ?Labs: ordered. ?Radiology: ordered. ? ?Risk ?Prescription drug management. ? ? ?Dysuria with colicky abdominal pain, highly consistent with ureteral colic or kidney stone.  She may be experiencing persistent ureteral spasms after passing the stone, but it is possible she is also passing a secondary stone as she is still actively uncomfortable.  I have ordered oral pain medications, Zofran for nausea.  We will perform a CT renal stone study.  UA reviewed personally showing no evidence of infection but does have hemoglobin consistent with past urinary stone.  I have a lower suspicion for acute appendicitis or other acute intra-abdominal surgical or emergent processes at this time. ? ? ? ? ? ? ? ?Final Clinical Impression(s) / ED Diagnoses ?Final diagnoses:  ?Ureteral colic  ?Kidney stone  ? ? ?Rx / DC Orders ?ED Discharge Orders   ? ?       Ordered  ?  ondansetron (ZOFRAN-ODT) 4 MG disintegrating tablet  Every 8 hours PRN       ? 11/06/21 1042  ?  HYDROcodone-acetaminophen (NORCO/VICODIN) 5-325 MG tablet  Every 6 hours PRN       ? 11/06/21 1042  ?  i

## 2021-11-07 ENCOUNTER — Telehealth: Payer: Self-pay | Admitting: General Practice

## 2021-11-07 NOTE — Telephone Encounter (Signed)
I don't see ER visit. When was it? Did they do a UA? Have her come in for repeat UA and culture? Leukocytes do not always mean infection but can mean infection?

## 2021-11-07 NOTE — Telephone Encounter (Signed)
Transition Care Management Unsuccessful Follow-up Telephone Call ? ?Date of discharge and from where:  11/06/21 from [facility] ? ?Attempts:  1st Attempt ? ?Reason for unsuccessful TCM follow-up call:  Left voice message Need a three day hospital follow up (was discharged on 11/06/21) ? ?  ?

## 2021-11-08 ENCOUNTER — Ambulatory Visit: Payer: BC Managed Care – PPO | Admitting: Physician Assistant

## 2021-11-08 NOTE — Telephone Encounter (Signed)
Scheduled

## 2021-11-09 NOTE — Telephone Encounter (Signed)
Transition Care Management Unsuccessful Follow-up Telephone Call ? ?Date of discharge and from where:  11/06/21 from [facility] ? ?Attempts:  2nd Attempt ? ?Reason for unsuccessful TCM follow-up call:  Left voice message Patient needs a hospital follow up after three days of discharge. Discharge date was 11/06/21. ? ?  ?

## 2021-11-14 NOTE — Telephone Encounter (Signed)
Transition Care Management Unsuccessful Follow-up Telephone Call ? ?Date of discharge and from where:  11/06/21 from [facility] ? ?Attempts:  3rd Attempt ? ?Reason for unsuccessful TCM follow-up call:  Left voice message Patient needs hospital follow up after three days of discharge. Discharge date was 11/06/21. ? ?  ?

## 2021-11-29 ENCOUNTER — Telehealth: Payer: BC Managed Care – PPO | Admitting: Physician Assistant

## 2021-11-29 DIAGNOSIS — J019 Acute sinusitis, unspecified: Secondary | ICD-10-CM | POA: Diagnosis not present

## 2021-11-29 DIAGNOSIS — B9689 Other specified bacterial agents as the cause of diseases classified elsewhere: Secondary | ICD-10-CM | POA: Diagnosis not present

## 2021-11-29 MED ORDER — DOXYCYCLINE HYCLATE 100 MG PO TABS: 100.0000 mg | ORAL_TABLET | Freq: Two times a day (BID) | ORAL | 0 refills | Status: DC

## 2021-11-29 NOTE — Progress Notes (Signed)
?Virtual Visit Consent  ? ?[name], you are scheduled for a virtual visit with a [facility] provider today.   ?  ?Just as with appointments in the office, your consent must be obtained to participate.  Your consent will be active for this visit and any virtual visit you may have with one of our providers in the next 365 days.   ?  ?If you have a MyChart account, a copy of this consent can be sent to you electronically.  All virtual visits are billed to your insurance company just like a traditional visit in the office.   ? ?As this is a virtual visit, video technology does not allow for your provider to perform a traditional examination.  This may limit your provider's ability to fully assess your condition.  If your provider identifies any concerns that need to be evaluated in person or the need to arrange testing (such as labs, EKG, etc.), we will make arrangements to do so.   ?  ?Although advances in technology are sophisticated, we cannot ensure that it will always work on either your end or our end.  If the connection with a video visit is poor, the visit may have to be switched to a telephone visit.  With either a video or telephone visit, we are not always able to ensure that we have a secure connection.    ? ?Also, by engaging in this virtual visit, you consent to the provision of healthcare. Additionally, you authorize for your insurance to be billed (if applicable) for the services provided during this visit.  ? ?I need to obtain your verbal consent now.   Are you willing to proceed with your visit today?  ?  ?Brenda Sharp has provided verbal consent on 11/29/2021 for a virtual visit (video or telephone). ?  ?Brenda Sharp, Brenda Sharp  ? ?Date: 11/29/2021 10:50 AM ? ? ?Virtual Visit via Video Note  ? ?Brenda Sharp, connected with  Brenda Sharp  ([id number], May 12, 1981) on 11/29/21 at 10:45 AM EDT by a video-enabled telemedicine application and verified that I am speaking with the correct  person using two identifiers. ? ?Location: ?Patient: Virtual Visit Location Patient: Home ?Provider: Virtual Visit Location Provider: Home Office ?  ?I discussed the limitations of evaluation and management by telemedicine and the availability of in person appointments. The patient expressed understanding and agreed to proceed.   ? ?History of Present Illness: ?Brenda Sharp is a 41 y.o. who identifies as a female who was assigned female at birth, and is being seen today for some ongoing allergy symptoms including head and nasal congestion over the past couple of months, now with significant sore throat, R > L with cough and increased sinus congestion and pressure.Notes sinus pain and tooth pain. Ears have felt clogged but no pain. Denies fever, chills, aches or true chest congestion.Denies recent travel or sick contact.  ? ?Has taken OTc Sudafed, Robitussin and Tylenol.  ?  ?HPI: HPI  ?Problems:  ?Patient Active Problem List  ? Diagnosis Date Noted  ? Eustachian tube disorder, left 01/25/2021  ? Recurrent major depressive disorder, in partial remission ([place]) 07/18/2019  ? Morbid obesity with body mass index of 40.0-44.9 in adult [facility]) 07/29/2018  ? Diabetes mellitus due to underlying condition with unspecified complications ([place]) 43/32/9518  ? Mixed dyslipidemia 07/29/2018  ? Kidney stones 09/25/2017  ? 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  Chronic pain syndrome 12/31/2015  ? 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 Abnormal involuntary movement 02/01/2015  ? Migraine headache 10/14/2014  ? Severe obesity (BMI >= 40) ([place]) 06/11/2014  ? Hypothyroidism 08/21/2013  ? Fatty liver 10/18/2012  ? Essential hypertension, benign 09/27/2012  ? Other and unspecified hyperlipidemia 12/01/2011  ? Obesity 10/11/2011  ? Gout 06/08/2011  ?  ?Allergies:  ?Allergies  ?Allergen Reactions  ? Imitrex [Sumatriptan]   ?  Chest pain  ? Januvia [Sitagliptin] Other (See Comments)  ?  Nausea, GI  upset  ? Penicillins   ?  Other reaction(s): Other ?migraines  ? Victoza [Liraglutide] Nausea Only  ? Colchicine Nausea Only  ? Trulicity [Dulaglutide] Nausea Only  ? ?Medications:  ?Current Outpatient Medications:  ?  doxycycline (VIBRA-TABS) 100 MG tablet, Take 1 tablet (100 mg total) by mouth 2 (two) times daily., Disp: 20 tablet, Rfl: 0 ?  buPROPion (WELLBUTRIN XL) 150 MG 24 hr tablet, Take 1 tablet by mouth daily., Disp: 90 tablet, Rfl: 3 ?  DULoxetine (CYMBALTA) 60 MG capsule, Take 1 capsule by mouth daily., Disp: 90 capsule, Rfl: 1 ?  Febuxostat 80 MG TABS, Take 1 tablet (80 mg total) by mouth daily., Disp: 90 tablet, Rfl: 3 ?  fluticasone (FLONASE) 50 MCG/ACT nasal spray, Place 1-2 sprays into both nostrils daily., Disp: 16 g, Rfl: 0 ?  HYDROcodone-acetaminophen (NORCO/VICODIN) 5-325 MG tablet, Take 1 tablet by mouth every 6 (six) hours as needed for up to 10 doses for severe pain., Disp: 10 tablet, Rfl: 0 ?  ibuprofen (ADVIL) 600 MG tablet, Take 1 tablet (600 mg total) by mouth every 6 (six) hours as needed for up to 30 doses for moderate pain or mild pain., Disp: 30 tablet, Rfl: 0 ?  indomethacin (INDOCIN) 50 MG capsule, TAKE ONE CAPSULE BY MOUTH TWICE DAILY, WITH A MEAL AS NEEDED, Disp: 90 capsule, Rfl: 3 ?  labetalol (NORMODYNE) 300 MG tablet, Take 1 tablet by mouth twice daily., Disp: 180 tablet, Rfl: 3 ?  levothyroxine (SYNTHROID) 112 MCG tablet, Take 1 tablet by mouth daily before breakfast., Disp: 90 tablet, Rfl: 1 ?  metFORMIN (GLUCOPHAGE-XR) 500 MG 24 hr tablet, Take 1 tablet (500 mg total) by mouth daily with breakfast., Disp: 90 tablet, Rfl: 1 ?  NIFEdipine (PROCARDIA XL/NIFEDICAL XL) 60 MG 24 hr tablet, Take 1 tablet (60 mg total) by mouth daily., Disp: 90 tablet, Rfl: 3 ?  ondansetron (ZOFRAN-ODT) 4 MG disintegrating tablet, Take 1 tablet (4 mg total) by mouth every 8 (eight) hours as needed for up to 10 doses for nausea or vomiting., Disp: 10 tablet, Rfl: 0 ? ?Observations/Objective: ?Patient  is well-developed, well-nourished in no acute distress.  ?Resting comfortably at home.  ?Head is normocephalic, atraumatic.  ?No labored breathing. ?Speech is clear and coherent with logical content.  ?Patient is alert and oriented at baseline.  ? ?Assessment and Plan: ?1. Acute bacterial sinusitis ?- doxycycline (VIBRA-TABS) 100 MG tablet; Take 1 tablet (100 mg total) by mouth 2 (two) times daily.  Dispense: 20 tablet; Refill: 0 ? ?Will have her stop Sudafed due to her cardiac history. Supportive measures and OTC medications reviewed. Rx Doxycycline.  Increase fluids.  Rest.  Saline nasal spray.  Probiotic.  Mucinex as directed.  Humidifier in bedroom. Restart Flonase.  Call or return to clinic if symptoms are not improving. ? ? ?Follow Up Instructions: ?I discussed the assessment and treatment plan with the patient. The patient was provided an opportunity to ask questions and all were  answered. The patient agreed with the plan and demonstrated an understanding of the instructions.  A copy of instructions were sent to the patient via MyChart unless otherwise noted below.  ? ?The patient was advised to call back or seek an in-person evaluation if the symptoms worsen or if the condition fails to improve as anticipated. ? ?Time:  ?I spent 10 minutes with the patient via telehealth technology discussing the above problems/concerns.   ? ?Brenda Sharp, Brenda Sharp ?

## 2021-11-29 NOTE — Patient Instructions (Signed)
?[name], thank you for joining Leeanne Rio, PA-C for today's virtual visit.  While this provider is not your primary care provider (PCP), if your PCP is located in our provider database this encounter information will be shared with them immediately following your visit. ? ?Consent: ?(Patient) Brenda Sharp provided verbal consent for this virtual visit at the beginning of the encounter. ? ?Current Medications: ? ?Current Outpatient Medications:  ?  buPROPion (WELLBUTRIN XL) 150 MG 24 hr tablet, Take 1 tablet by mouth daily., Disp: 90 tablet, Rfl: 3 ?  DULoxetine (CYMBALTA) 60 MG capsule, Take 1 capsule by mouth daily., Disp: 90 capsule, Rfl: 1 ?  Febuxostat 80 MG TABS, Take 1 tablet (80 mg total) by mouth daily., Disp: 90 tablet, Rfl: 3 ?  fluticasone (FLONASE) 50 MCG/ACT nasal spray, Place 1-2 sprays into both nostrils daily., Disp: 16 g, Rfl: 0 ?  HYDROcodone-acetaminophen (NORCO/VICODIN) 5-325 MG tablet, Take 1 tablet by mouth every 6 (six) hours as needed for up to 10 doses for severe pain., Disp: 10 tablet, Rfl: 0 ?  ibuprofen (ADVIL) 600 MG tablet, Take 1 tablet (600 mg total) by mouth every 6 (six) hours as needed for up to 30 doses for moderate pain or mild pain., Disp: 30 tablet, Rfl: 0 ?  indomethacin (INDOCIN) 50 MG capsule, TAKE ONE CAPSULE BY MOUTH TWICE DAILY, WITH A MEAL AS NEEDED, Disp: 90 capsule, Rfl: 3 ?  labetalol (NORMODYNE) 300 MG tablet, Take 1 tablet by mouth twice daily., Disp: 180 tablet, Rfl: 3 ?  levothyroxine (SYNTHROID) 112 MCG tablet, Take 1 tablet by mouth daily before breakfast., Disp: 90 tablet, Rfl: 1 ?  metFORMIN (GLUCOPHAGE-XR) 500 MG 24 hr tablet, Take 1 tablet (500 mg total) by mouth daily with breakfast., Disp: 90 tablet, Rfl: 1 ?  NIFEdipine (PROCARDIA XL/NIFEDICAL XL) 60 MG 24 hr tablet, Take 1 tablet (60 mg total) by mouth daily., Disp: 90 tablet, Rfl: 3 ?  ondansetron (ZOFRAN-ODT) 4 MG disintegrating tablet, Take 1 tablet (4 mg total) by mouth every 8  (eight) hours as needed for up to 10 doses for nausea or vomiting., Disp: 10 tablet, Rfl: 0  ? ?Medications ordered in this encounter:  ?No orders of the defined types were placed in this encounter. ?  ? ?*If you need refills on other medications prior to your next appointment, please contact your pharmacy* ? ?Follow-Up: ?Call back or seek an in-person evaluation if the symptoms worsen or if the condition fails to improve as anticipated. ? ?Other Instructions ?Please take antibiotic as directed.  Increase fluid intake.  Use Saline nasal spray.  Take a daily multivitamin. Restart your Flonase. You can use Mucinex-DM OTC for congestion and cough.  Place a humidifier in the bedroom.  Please call or return clinic if symptoms are not improving. ? ?Sinusitis ?Sinusitis is redness, soreness, and swelling (inflammation) of the paranasal sinuses. Paranasal sinuses are air pockets within the bones of your face (beneath the eyes, the middle of the forehead, or above the eyes). In healthy paranasal sinuses, mucus is able to drain out, and air is able to circulate through them by way of your nose. However, when your paranasal sinuses are inflamed, mucus and air can become trapped. This can allow bacteria and other germs to grow and cause infection. ?Sinusitis can develop quickly and last only a short time (acute) or continue over a long period (chronic). Sinusitis that lasts for more than 12 weeks is considered chronic.  ?CAUSES  ?Causes  of sinusitis include: ?Allergies. ?Structural abnormalities, such as displacement of the cartilage that separates your nostrils (deviated septum), which can decrease the air flow through your nose and sinuses and affect sinus drainage. ?Functional abnormalities, such as when the small hairs (cilia) that line your sinuses and help remove mucus do not work properly or are not present. ?SYMPTOMS  ?Symptoms of acute and chronic sinusitis are the same. The primary symptoms are pain and pressure  around the affected sinuses. Other symptoms include: ?Upper toothache. ?Earache. ?Headache. ?Bad breath. ?Decreased sense of smell and taste. ?A cough, which worsens when you are lying flat. ?Fatigue. ?Fever. ?Thick drainage from your nose, which often is green and may contain pus (purulent). ?Swelling and warmth over the affected sinuses. ?DIAGNOSIS  ?Your caregiver will perform a physical exam. During the exam, your caregiver may: ?Look in your nose for signs of abnormal growths in your nostrils (nasal polyps). ?Tap over the affected sinus to check for signs of infection. ?View the inside of your sinuses (endoscopy) with a special imaging device with a light attached (endoscope), which is inserted into your sinuses. ?If your caregiver suspects that you have chronic sinusitis, one or more of the following tests may be recommended: ?Allergy tests. ?Nasal culture A sample of mucus is taken from your nose and sent to a lab and screened for bacteria. ?Nasal cytology A sample of mucus is taken from your nose and examined by your caregiver to determine if your sinusitis is related to an allergy. ?TREATMENT  ?Most cases of acute sinusitis are related to a viral infection and will resolve on their own within 10 days. Sometimes medicines are prescribed to help relieve symptoms (pain medicine, decongestants, nasal steroid sprays, or saline sprays).  ?However, for sinusitis related to a bacterial infection, your caregiver will prescribe antibiotic medicines. These are medicines that will help kill the bacteria causing the infection.  ?Rarely, sinusitis is caused by a fungal infection. In theses cases, your caregiver will prescribe antifungal medicine. ?For some cases of chronic sinusitis, surgery is needed. Generally, these are cases in which sinusitis recurs more than 3 times per year, despite other treatments. ?HOME CARE INSTRUCTIONS  ?Drink plenty of water. Water helps thin the mucus so your sinuses can drain more  easily. ?Use a humidifier. ?Inhale steam 3 to 4 times a day (for example, sit in the bathroom with the shower running). ?Apply a warm, moist washcloth to your face 3 to 4 times a day, or as directed by your caregiver. ?Use saline nasal sprays to help moisten and clean your sinuses. ?Take over-the-counter or prescription medicines for pain, discomfort, or fever only as directed by your caregiver. ?SEEK IMMEDIATE MEDICAL CARE IF: ?You have increasing pain or severe headaches. ?You have nausea, vomiting, or drowsiness. ?You have swelling around your face. ?You have vision problems. ?You have a stiff neck. ?You have difficulty breathing. ?MAKE SURE YOU:  ?Understand these instructions. ?Will watch your condition. ?Will get help right away if you are not doing well or get worse. ?Document Released: 07/24/2005 Document Revised: 10/16/2011 Document Reviewed: 08/08/2011 ?ExitCare? Patient Information ?2014 [place], [place]. ? ? ? ?If you have been instructed to have an in-person evaluation today at a local Urgent Care facility, please use the link below. It will take you to a list of all of our available [facility], including address, phone number and hours of operation. Please do not delay care.  ?[facility] ? ?If you or a family member  do not have a primary care provider, use the link below to schedule a visit and establish care. When you choose a [facility] primary care physician or advanced practice provider, you gain a long-term partner in health. ?Find a Primary Care Provider ? ?Learn more about [facility]'s in-office and virtual care options: ?[facility]  ?

## 2021-11-30 ENCOUNTER — Telehealth: Payer: BC Managed Care – PPO | Admitting: Family Medicine

## 2021-12-19 ENCOUNTER — Ambulatory Visit: Payer: BC Managed Care – PPO | Admitting: Family Medicine

## 2021-12-19 ENCOUNTER — Encounter: Payer: Self-pay | Admitting: Family Medicine

## 2021-12-19 VITALS — BP 115/80 | HR 98 | Temp 98.8°F | Ht 63.0 in | Wt 250.0 lb

## 2021-12-19 DIAGNOSIS — H6692 Otitis media, unspecified, left ear: Secondary | ICD-10-CM

## 2021-12-19 DIAGNOSIS — J0100 Acute maxillary sinusitis, unspecified: Secondary | ICD-10-CM

## 2021-12-19 DIAGNOSIS — H7292 Unspecified perforation of tympanic membrane, left ear: Secondary | ICD-10-CM

## 2021-12-19 DIAGNOSIS — H7290 Unspecified perforation of tympanic membrane, unspecified ear: Secondary | ICD-10-CM | POA: Insufficient documentation

## 2021-12-19 DIAGNOSIS — H6690 Otitis media, unspecified, unspecified ear: Secondary | ICD-10-CM | POA: Insufficient documentation

## 2021-12-19 MED ORDER — CEFDINIR 300 MG PO CAPS: 300.0000 mg | ORAL_CAPSULE | Freq: Two times a day (BID) | ORAL | 0 refills | Status: AC

## 2021-12-19 NOTE — Assessment & Plan Note (Signed)
TM is retracted with fluid in the ear.  Unable to fully visualize perforation.  Starting omnicef '300mg'$  BID x10 days.  Return in 3-4 weeks to recheck.   ?

## 2021-12-19 NOTE — Patient Instructions (Signed)
Start cefdinir '300mg'$  twice daily for 10 days.  ?Follow up in 3-4 weeks to be sure fluid has resolved and eardrum looks ok.   ?

## 2021-12-19 NOTE — Progress Notes (Signed)
?Brenda Sharp - 41 y.o. female MRN [id number]  Date of birth: Dec 30, 1980 ? ?Subjective ?Chief Complaint  ?Patient presents with  ? Cough  ? Generalized Body Aches  ? Sore Throat  ? Ear Pain  ? ? ?HPI ?Brenda Sharp is a 41 y.o. female here today with complaint of sinus pain and pressure, ear pain, nasal congestion, and mild cough.  She feels like this started a couple of months ago.  Seen for ear pain on 3/14 and given prednisone.  This helped for a short period of time.  Continue to have feeling of fullness and symptoms acutely worsened again in April.  Seen via E-visit and prescribed doxycycline.  Once again had improvement for a couple of days with worsening here recently.  She was having pain in her left ear until about 2 days ago when this changed to a muffled sensation and she noted some drainage from her ear.  She has not seen any blood from her ear.  She denies headache, fever, chills, or dyspnea.   ? ?ROS:  A comprehensive ROS was completed and negative except as noted per HPI ? ?Allergies  ?Allergen Reactions  ? Imitrex [Sumatriptan]   ?  Chest pain  ? Januvia [Sitagliptin] Other (See Comments)  ?  Nausea, GI upset  ? Penicillins   ?  Other reaction(s): Other ?migraines  ? Victoza [Liraglutide] Nausea Only  ? Colchicine Nausea Only  ? Trulicity [Dulaglutide] Nausea Only  ? ? ?Past Medical History:  ?Diagnosis Date  ? Gout   ? Headache(784.0)   ? Hypertension   ? Hypothyroidism   ? Kidney stone   ? Miscarriage   ? PCOS (polycystic ovarian syndrome)   ? Tophi 2017  ? Left knee  ? Torticollis   ? treated with botox in 2016  ? ? ?Past Surgical History:  ?Procedure Laterality Date  ? CESAREAN SECTION N/A 12/11/2019  ? Procedure: CESAREAN SECTION;  Surgeon: Aloha Gell, MD;  Location: [facility];  Service: Obstetrics;  Laterality: N/A;  ? LITHOTRIPSY    ? OTHER SURGICAL HISTORY    ? done a procedure to rid of arthritis of the right big toe per patient   ? OTHER SURGICAL HISTORY Left 2017  ? gout related-tophi per  patient   ? TONSILLECTOMY  1991  ? tophi removal  08/2016  ? left knee  ? tophi removal  07/2016  ? right great toe  ? [place] EXTRACTION  2006  ? ? ?Social History  ? ?Socioeconomic History  ? Marital status: Married  ?  Spouse name: Shakeda Pearse  ? Number of children: 0  ? Years of education: college  ? Highest education level: Bachelor's degree (e.g., BA, AB, BS)  ?Occupational History  ? Occupation: Pharmacist, hospital  ?  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 Smoking status: Never  ? Smokeless tobacco: Never  ?Vaping Use  ? Vaping Use: Never used  ?Substance and Sexual Activity  ? Alcohol use: No  ?  Alcohol/week: 0.0 standard drinks  ? Drug use: No  ? Sexual activity: Yes  ?  Partners: Male  ?Other Topics Concern  ? Not on file  ?Social History Narrative  ? 1 cup caffeine/day  ? Right-handed.  ? Lives at home with husband.  ? ?Social Determinants of Health  ? ?Financial Resource Strain: Not on file  ?Food Insecurity: Not on file  ?Transportation Needs: Not on file  ?Physical Activity: Not on file  ?Stress: Not on  file  ?Social Connections: Not on file  ? ? ?Family History  ?Problem Relation Age of Onset  ? Hypertension Father   ? Gout Father   ? Depression Mother   ? Lung cancer Paternal Grandfather   ?     smoker  ? Heart attack Paternal Grandfather   ? Heart attack Paternal Grandmother   ? Heart attack Maternal Grandfather   ? Colon cancer Neg Hx   ? Esophageal cancer Neg Hx   ? ? ?Health Maintenance  ?Topic Date Due  ? COVID-19 Vaccine (3 - Booster for Moderna series) 12/31/2019  ? OPHTHALMOLOGY EXAM  11/18/2020  ? INFLUENZA VACCINE  03/07/2022  ? HEMOGLOBIN A1C  04/23/2022  ? PAP SMEAR-Modifier  06/08/2022  ? FOOT EXAM  10/22/2022  ? URINE MICROALBUMIN  10/22/2022  ? TETANUS/TDAP  01/26/2031  ? Hepatitis C Screening  Completed  ? HIV Screening  Completed  ? Pneumococcal Vaccine 109-43 Years old  Aged Out  ? HPV VACCINES  Aged Out   ? ? ? ?----------------------------------------------------------------------------------------------------------------------------------------------------------------------------------------------------------------- ?Physical Exam ?BP 115/80 (BP Location: Left Arm, Patient Position: Sitting, Cuff Size: Normal)   Pulse 98   Temp 98.8 ?F (37.1 ?C) (Oral)   Ht '5\' 3"'$  (1.6 m)   Wt 250 lb (113.4 kg)   SpO2 98%   BMI 44.29 kg/m?  ? ?Physical Exam ?Constitutional:   ?   Appearance: Normal appearance.  ?HENT:  ?   Right Ear: Tympanic membrane normal.  ?   Ears:  ?   Comments: Fluid in L ear , TM retracted with erythema.  ?   Mouth/Throat:  ?   Mouth: Mucous membranes are moist.  ?Eyes:  ?   Conjunctiva/sclera: Conjunctivae normal.  ?Cardiovascular:  ?   Rate and Rhythm: Normal rate and regular rhythm.  ?Pulmonary:  ?   Effort: Pulmonary effort is normal.  ?   Breath sounds: Normal breath sounds.  ?Neurological:  ?   General: No focal deficit present.  ?   Mental Status: She is alert.  ?Psychiatric:     ?   Mood and Affect: Mood normal.     ?   Behavior: Behavior normal.  ? ? ?------------------------------------------------------------------------------------------------------------------------------------------------------------------------------------------------------------------- ?Assessment and Plan ? ?Otitis media with rupture of tympanic membrane ?TM is retracted with fluid in the ear.  Unable to fully visualize perforation.  Starting omnicef '300mg'$  BID x10 days.  Return in 3-4 weeks to recheck.   ? ? ?Meds ordered this encounter  ?Medications  ? cefdinir (OMNICEF) 300 MG capsule  ?  Sig: Take 1 capsule (300 mg total) by mouth 2 (two) times daily for 10 days.  ?  Dispense:  20 capsule  ?  Refill:  0  ? ? ?No follow-ups on file. ? ? ? ?This visit occurred during the SARS-CoV-2 public health emergency.  Safety protocols were in place, including screening questions prior to the visit, additional usage of staff  PPE, and extensive cleaning of exam room while observing appropriate contact time as indicated for disinfecting solutions.  ? ?

## 2021-12-29 ENCOUNTER — Encounter: Payer: Self-pay | Admitting: Family Medicine

## 2021-12-29 MED ORDER — PREDNISONE 20 MG PO TABS: 40.0000 mg | ORAL_TABLET | Freq: Every day | ORAL | 0 refills | Status: DC

## 2022-01-23 ENCOUNTER — Ambulatory Visit: Payer: BC Managed Care – PPO | Admitting: Family Medicine

## 2022-02-02 ENCOUNTER — Ambulatory Visit: Payer: BC Managed Care – PPO | Admitting: Family Medicine

## 2022-02-02 ENCOUNTER — Encounter: Payer: Self-pay | Admitting: Family Medicine

## 2022-02-02 VITALS — BP 128/87 | HR 101 | Ht 63.0 in | Wt 251.0 lb

## 2022-02-02 DIAGNOSIS — I10 Essential (primary) hypertension: Secondary | ICD-10-CM | POA: Diagnosis not present

## 2022-02-02 DIAGNOSIS — E088 Diabetes mellitus due to underlying condition with unspecified complications: Secondary | ICD-10-CM | POA: Diagnosis not present

## 2022-02-02 DIAGNOSIS — M1A9XX1 Chronic gout, unspecified, with tophus (tophi): Secondary | ICD-10-CM

## 2022-02-02 DIAGNOSIS — M1A0621 Idiopathic chronic gout, left knee, with tophus (tophi): Secondary | ICD-10-CM | POA: Diagnosis not present

## 2022-02-02 DIAGNOSIS — F3341 Major depressive disorder, recurrent, in partial remission: Secondary | ICD-10-CM | POA: Diagnosis not present

## 2022-02-02 LAB — POCT GLYCOSYLATED HEMOGLOBIN (HGB A1C): Hemoglobin A1C: 6.3 % — AB (ref 4.0–5.6)

## 2022-02-02 NOTE — Progress Notes (Signed)
   Established Patient Office Visit  Subjective   Patient ID: Brenda Sharp, female    DOB: July 07, 1981  Age: 41 y.o. MRN: [id number]  Chief Complaint  Patient presents with   Diabetes   Hypertension    HPI  F/U Gout -we increased Uloric back in March.  Diabetes - no hypoglycemic events. No wounds or sores that are not healing well. No increased thirst or urination. Checking glucose at home. Taking medications as prescribed without any side effects.  Hypertension- Pt denies chest pain, SOB, dizziness, or heart palpitations.  Taking meds as directed w/o problems.  Denies medication side effects.       ROS    Objective:     BP 128/87   Pulse (!) 101   Ht '5\' 3"'$  (1.6 m)   Wt 251 lb (113.9 kg)   SpO2 96%   BMI 44.46 kg/m    Physical Exam Vitals and nursing note reviewed.  Constitutional:      Appearance: She is well-developed.  HENT:     Head: Normocephalic and atraumatic.  Cardiovascular:     Rate and Rhythm: Normal rate and regular rhythm.     Heart sounds: Normal heart sounds.  Pulmonary:     Effort: Pulmonary effort is normal.     Breath sounds: Normal breath sounds.  Skin:    General: Skin is warm and dry.  Neurological:     Mental Status: She is alert and oriented to person, place, and time.  Psychiatric:        Behavior: Behavior normal.      Results for orders placed or performed in visit on 02/02/22  POCT glycosylated hemoglobin (Hb A1C)  Result Value Ref Range   Hemoglobin A1C 6.3 (A) 4.0 - 5.6 %   HbA1c POC (<> result, manual entry)     HbA1c, POC (prediabetic range)     HbA1c, POC (controlled diabetic range)        The ASCVD Risk score (Arnett DK, et al., 2019) failed to calculate for the following reasons:   The 2019 ASCVD risk score is only valid for ages 44 to 75    Assessment & Plan:   Problem List Items Addressed This Visit       Cardiovascular and Mediastinum   Essential hypertension, benign    Well controlled. Continue  current regimen. Follow up in  4 mo         Endocrine   Diabetes mellitus due to underlying condition with unspecified complications ([place])    Well controlled. Continue current regimen. Follow up in  4 mo       Relevant Orders   POCT glycosylated hemoglobin (Hb A1C) (Completed)     Other   Tophi gouty   Recurrent major depressive disorder, in partial remission ([place])    Doing well on her current regimen she does not want to make any changes or adjustments today.  Negative PHQ 2.  PHQ-9 score of 4 for fatigue and sleep.  And GAD-7 score of 0.      Gout - Primary    Doing well on increased dose of Uloric since I last saw her 3 months ago.  We will plan to recheck her uric acid in the fall.       Return in about 4 months (around 06/04/2022) for Diabetes follow-up, Hypertension and check uric acid level.    Beatrice Lecher, MD

## 2022-02-02 NOTE — Assessment & Plan Note (Signed)
Doing well on her current regimen she does not want to make any changes or adjustments today.  Negative PHQ 2.  PHQ-9 score of 4 for fatigue and sleep.  And GAD-7 score of 0.

## 2022-02-02 NOTE — Assessment & Plan Note (Signed)
Doing well on increased dose of Uloric since I last saw her 3 months ago.  We will plan to recheck her uric acid in the fall.

## 2022-02-02 NOTE — Assessment & Plan Note (Signed)
Well controlled. Continue current regimen. Follow up in  4 mo 

## 2022-04-03 ENCOUNTER — Other Ambulatory Visit: Payer: Self-pay | Admitting: Family Medicine

## 2022-04-03 DIAGNOSIS — F3341 Major depressive disorder, recurrent, in partial remission: Secondary | ICD-10-CM

## 2022-06-06 ENCOUNTER — Ambulatory Visit: Payer: BC Managed Care – PPO | Admitting: Family Medicine

## 2022-06-06 ENCOUNTER — Telehealth: Payer: BC Managed Care – PPO | Admitting: Physician Assistant

## 2022-06-06 DIAGNOSIS — B9689 Other specified bacterial agents as the cause of diseases classified elsewhere: Secondary | ICD-10-CM

## 2022-06-06 DIAGNOSIS — J208 Acute bronchitis due to other specified organisms: Secondary | ICD-10-CM | POA: Diagnosis not present

## 2022-06-06 MED ORDER — BENZONATATE 100 MG PO CAPS: 100.0000 mg | ORAL_CAPSULE | Freq: Three times a day (TID) | ORAL | 0 refills | Status: DC | PRN

## 2022-06-06 MED ORDER — ALBUTEROL SULFATE HFA 108 (90 BASE) MCG/ACT IN AERS: 2.0000 | INHALATION_SPRAY | Freq: Four times a day (QID) | RESPIRATORY_TRACT | 0 refills | Status: DC | PRN

## 2022-06-06 MED ORDER — AZITHROMYCIN 250 MG PO TABS: ORAL_TABLET | ORAL | 0 refills | Status: AC

## 2022-06-06 NOTE — Progress Notes (Signed)
We are sorry that you are not feeling well.  Here is how we plan to help!  Based on your presentation I believe you most likely have A cough due to bacteria.  When patients have a fever and a productive cough with a change in color or increased sputum production, we are concerned about bacterial bronchitis.  If left untreated it can progress to pneumonia.  If your symptoms do not improve with your treatment plan it is important that you contact your provider.   I have prescribed Azithromyin 250 mg: two tablets now and then one tablet daily for 4 additonal days    In addition you may use A prescription cough medication called Tessalon Perles '100mg'$ . You may take 1-2 capsules every 8 hours as needed for your cough.  I have also sent in an albuterol inhaler to use as directed.   From your responses in the eVisit questionnaire you describe inflammation in the upper respiratory tract which is causing a significant cough.  This is commonly called Bronchitis and has four common causes:   Allergies Viral Infections Acid Reflux Bacterial Infection Allergies, viruses and acid reflux are treated by controlling symptoms or eliminating the cause. An example might be a cough caused by taking certain blood pressure medications. You stop the cough by changing the medication. Another example might be a cough caused by acid reflux. Controlling the reflux helps control the cough.  USE OF BRONCHODILATOR ("RESCUE") INHALERS: There is a risk from using your bronchodilator too frequently.  The risk is that over-reliance on a medication which only relaxes the muscles surrounding the breathing tubes can reduce the effectiveness of medications prescribed to reduce swelling and congestion of the tubes themselves.  Although you feel brief relief from the bronchodilator inhaler, your asthma may actually be worsening with the tubes becoming more swollen and filled with mucus.  This can delay other crucial treatments, such as oral  steroid medications. If you need to use a bronchodilator inhaler daily, several times per day, you should discuss this with your provider.  There are probably better treatments that could be used to keep your asthma under control.     HOME CARE Only take medications as instructed by your medical team. Complete the entire course of an antibiotic. Drink plenty of fluids and get plenty of rest. Avoid close contacts especially the very young and the elderly Cover your mouth if you cough or cough into your sleeve. Always remember to wash your hands A steam or ultrasonic humidifier can help congestion.   GET HELP RIGHT AWAY IF: You develop worsening fever. You become short of breath You cough up blood. Your symptoms persist after you have completed your treatment plan MAKE SURE YOU  Understand these instructions. Will watch your condition. Will get help right away if you are not doing well or get worse.    Thank you for choosing an e-visit.  Your e-visit answers were reviewed by a board certified advanced clinical practitioner to complete your personal care plan. Depending upon the condition, your plan could have included both over the counter or prescription medications.  Please review your pharmacy choice. Make sure the pharmacy is open so you can pick up prescription now. If there is a problem, you may contact your provider through [employer] and have the prescription routed to another pharmacy.  Your safety is important to [place]. If you have drug allergies check your prescription carefully.   For the next 24 hours you can use MyChart  to ask questions about today's visit, request a non-urgent call back, or ask for a work or school excuse. You will get an email in the next two days asking about your experience. I hope that your e-visit has been valuable and will speed your recovery.

## 2022-06-06 NOTE — Progress Notes (Signed)
I have spent 5 minutes in review of e-visit questionnaire, review and updating patient chart, medical decision making and response to patient.   Charmine Bockrath Cody Edrees Valent, PA-C    

## 2022-09-07 ENCOUNTER — Telehealth: Payer: BC Managed Care – PPO | Admitting: Family Medicine

## 2022-09-07 DIAGNOSIS — K2970 Gastritis, unspecified, without bleeding: Secondary | ICD-10-CM

## 2022-09-07 MED ORDER — ONDANSETRON HCL 4 MG PO TABS: 4.0000 mg | ORAL_TABLET | Freq: Three times a day (TID) | ORAL | 0 refills | Status: DC | PRN

## 2022-09-07 NOTE — Progress Notes (Signed)
Virtual Visit Consent   Brenda Sharp, you are scheduled for a virtual visit with a [facility] provider today. Just as with appointments in the office, your consent must be obtained to participate. Your consent will be active for this visit and any virtual visit you may have with one of our providers in the next 365 days. If you have a MyChart account, a copy of this consent can be sent to you electronically.  As this is a virtual visit, video technology does not allow for your provider to perform a traditional examination. This may limit your provider's ability to fully assess your condition. If your provider identifies any concerns that need to be evaluated in person or the need to arrange testing (such as labs, EKG, etc.), we will make arrangements to do so. Although advances in technology are sophisticated, we cannot ensure that it will always work on either your end or our end. If the connection with a video visit is poor, the visit may have to be switched to a telephone visit. With either a video or telephone visit, we are not always able to ensure that we have a secure connection.  By engaging in this virtual visit, you consent to the provision of healthcare and authorize for your insurance to be billed (if applicable) for the services provided during this visit. Depending on your insurance coverage, you may receive a charge related to this service.  I need to obtain your verbal consent now. Are you willing to proceed with your visit today? [name] has provided verbal consent on 09/07/2022 for a virtual visit (video or telephone). [name], NP  Date: 09/07/2022 10:23 AM  Virtual Visit via Video Note   I, [name], connected with  Brenda Sharp  ([id number], 1980/08/15) on 09/07/22 at 10:30 AM EST by a video-enabled telemedicine application and verified that I am speaking with the correct person using two identifiers.  Location: Patient: Virtual Visit Location Patient:  Home Provider: Virtual Visit Location Provider: Home Office   I discussed the limitations of evaluation and management by telemedicine and the availability of in person appointments. The patient expressed understanding and agreed to proceed.    History of Present Illness: Brenda Sharp is a 42 y.o. who identifies as a female who was assigned female at birth, and is being seen today for stomach virus. Onset was Sunday night- with cramping and diarrhea. Associated symptoms Sunday and Monday fevers and chills, non since. As on going nausea and vomiting. Last episode of either was last night. Still urinating well- yellow color.. 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 Modifying factors- Emetrol without success.  Reports feeling better for an hour but it comes right back. Not related to a meal. As tried to reduce solid foods as well. Denies chest pain, shortness of breath, dizziness.   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 10/04/2016   Tophi gouty 01/13/2016   Chronic pain syndrome 12/31/2015   Spasmodic torticollis 02/11/2015   Abnormal involuntary movement 02/01/2015   Migraine headache 10/14/2014   Severe obesity (  BMI >= 40) ([place]) 06/11/2014   Hypothyroidism 08/21/2013   Fatty liver 10/18/2012   Essential hypertension, benign 09/27/2012   Other and unspecified hyperlipidemia 12/01/2011   Obesity 10/11/2011   Gout 06/08/2011    Allergies:  Allergies  Allergen Reactions   Imitrex [Sumatriptan]     Chest pain   Januvia [Sitagliptin] Other (See  Comments)    Nausea, GI upset   Penicillins     Other reaction(s): Other migraines   Victoza [Liraglutide] Nausea Only   Colchicine Nausea Only   Trulicity [Dulaglutide] Nausea Only   Medications:  Current Outpatient Medications:    albuterol (VENTOLIN HFA) 108 (90 Base) MCG/ACT inhaler, Inhale 2 puffs into the lungs every 6 (six) hours as needed for wheezing or shortness of breath., Disp: 8 g, Rfl: 0   benzonatate (TESSALON) 100 MG capsule, Take 1 capsule (100 mg total) by mouth 3 (three) times daily as needed for cough., Disp: 30 capsule, Rfl: 0   buPROPion (WELLBUTRIN XL) 150 MG 24 hr tablet, Take 1 tablet by mouth daily., Disp: 90 tablet, Rfl: 3   DULoxetine (CYMBALTA) 60 MG capsule, Take 1 capsule by mouth daily., Disp: 90 capsule, Rfl: 1   Febuxostat 80 MG TABS, Take 1 tablet (80 mg total) by mouth daily., Disp: 90 tablet, Rfl: 3   fluticasone (FLONASE) 50 MCG/ACT nasal spray, Place 1-2 sprays into both nostrils daily., Disp: 16 g, Rfl: 0   indomethacin (INDOCIN) 50 MG capsule, TAKE ONE CAPSULE BY MOUTH TWICE DAILY, WITH A MEAL AS NEEDED, Disp: 90 capsule, Rfl: 3   labetalol (NORMODYNE) 300 MG tablet, Take 1 tablet by mouth twice daily., Disp: 180 tablet, Rfl: 3   levothyroxine (SYNTHROID) 112 MCG tablet, Take 1 tablet by mouth daily before breakfast., Disp: 90 tablet, Rfl: 1   metFORMIN (GLUCOPHAGE-XR) 500 MG 24 hr tablet, Take 1 tablet (500 mg total) by mouth daily with breakfast., Disp: 90 tablet, Rfl: 1   NIFEdipine (PROCARDIA XL/NIFEDICAL XL) 60 MG 24 hr tablet, Take 1 tablet (60 mg total) by mouth daily., Disp: 90 tablet, Rfl: 3  Observations/Objective: Patient is well-developed, well-nourished in no acute distress.  Resting comfortably  at home.  Head is normocephalic, atraumatic.  No labored breathing.  Speech is clear and coherent with logical content.  Patient is alert and oriented at baseline.    Assessment and Plan:   1. Viral gastritis  - ondansetron (ZOFRAN) 4  MG tablet; Take 1 tablet (4 mg total) by mouth every 8 (eight) hours as needed for nausea or vomiting.  Dispense: 15 tablet; Refill: 0  -s&s consistent with stomach virus- -bland diet and OTC reviewed   Reviewed side effects, risks and benefits of medication.    Patient acknowledged agreement and understanding of the plan.   Past Medical, Surgical, Social History, Allergies, and Medications have been Reviewed.    Follow Up Instructions: I discussed the assessment and treatment plan with the patient. The patient was provided an opportunity to ask questions and all were answered. The patient agreed with the plan and demonstrated an understanding of the instructions.  A copy of instructions were sent to the patient via MyChart unless otherwise noted below.    The patient was advised to call back or seek an in-person evaluation if the symptoms worsen or if the condition fails to improve as anticipated.  Time:  I spent 10 minutes with the patient via telehealth technology discussing the above problems/concerns.    [name], NP

## 2022-09-07 NOTE — Patient Instructions (Signed)
[name], thank you for joining [name], NP for today's virtual visit.  While this provider is not your primary care provider (PCP), if your PCP is located in our provider database this encounter information will be shared with them immediately following your visit.   [facility] account gives you access to today's visit and all your visits, tests, and labs performed at [facility] " click here if you don't have a [facility] account or go to [email or web address]  Consent: (Patient) [name] provided verbal consent for this virtual visit at the beginning of the encounter.  Current Medications:  Current Outpatient Medications:    ondansetron (ZOFRAN) 4 MG tablet, Take 1 tablet (4 mg total) by mouth every 8 (eight) hours as needed for nausea or vomiting., Disp: 15 tablet, Rfl: 0   albuterol (VENTOLIN HFA) 108 (90 Base) MCG/ACT inhaler, Inhale 2 puffs into the lungs every 6 (six) hours as needed for wheezing or shortness of breath., Disp: 8 g, Rfl: 0   benzonatate (TESSALON) 100 MG capsule, Take 1 capsule (100 mg total) by mouth 3 (three) times daily as needed for cough., Disp: 30 capsule, Rfl: 0   buPROPion (WELLBUTRIN XL) 150 MG 24 hr tablet, Take 1 tablet by mouth daily., Disp: 90 tablet, Rfl: 3   DULoxetine (CYMBALTA) 60 MG capsule, Take 1 capsule by mouth daily., Disp: 90 capsule, Rfl: 1   Febuxostat 80 MG TABS, Take 1 tablet (80 mg total) by mouth daily., Disp: 90 tablet, Rfl: 3   fluticasone (FLONASE) 50 MCG/ACT nasal spray, Place 1-2 sprays into both nostrils daily., Disp: 16 g, Rfl: 0   indomethacin (INDOCIN) 50 MG capsule, TAKE ONE CAPSULE BY MOUTH TWICE DAILY, WITH A MEAL AS NEEDED, Disp: 90 capsule, Rfl: 3   labetalol (NORMODYNE) 300 MG tablet, Take 1 tablet by mouth twice daily., Disp: 180 tablet, Rfl: 3   levothyroxine (SYNTHROID) 112 MCG tablet, Take 1 tablet by mouth daily before breakfast., Disp: 90 tablet, Rfl: 1   metFORMIN  (GLUCOPHAGE-XR) 500 MG 24 hr tablet, Take 1 tablet (500 mg total) by mouth daily with breakfast., Disp: 90 tablet, Rfl: 1   NIFEdipine (PROCARDIA XL/NIFEDICAL XL) 60 MG 24 hr tablet, Take 1 tablet (60 mg total) by mouth daily., Disp: 90 tablet, Rfl: 3   Medications ordered in this encounter:  Meds ordered this encounter  Medications   ondansetron (ZOFRAN) 4 MG tablet    Sig: Take 1 tablet (4 mg total) by mouth every 8 (eight) hours as needed for nausea or vomiting.    Dispense:  15 tablet    Refill:  0    Order Specific Question:   Supervising Provider    Answer:   Chase Picket [id number]     *If you need refills on other medications prior to your next appointment, please contact your pharmacy*  Follow-Up: Call back or seek an in-person evaluation if the symptoms worsen or if the condition fails to improve as anticipated.  [place] [phone]  Other Instructions  Criss Rosales Diet A bland diet may consist of soft foods or foods that are not high in fat or are not greasy, acidic, or spicy. Avoiding certain foods may cause less irritation to your mouth, throat, stomach, or gastrointestinal tract. Avoiding certain foods may make you feel better. Everyone's tolerances are different. A bland diet should be based on what you can tolerate and what may cause discomfort. What is my plan? Your  health care provider or dietitian may recommend specific changes to your diet to treat your symptoms. These changes may include: Eating small meals frequently. Cooking food until it is soft enough to chew easily. Taking the time to chew your food thoroughly, so it is easy to swallow and digest. Avoiding foods that cause you discomfort. These may include spicy food, fried food, greasy foods, hard-to-chew foods, or citrus fruits and juices. Drinking slowly. What are tips for following this plan? Reading food labels To reduce fiber intake, look for food labels that say "whole," such as  whole wheat or whole grain. Shopping Avoid food items that may have nuts or seeds. Avoid vegetables that may make you gassy or have a tough texture, such as broccoli, cauliflower, or corn. Cooking Cook foods thoroughly so they have a soft texture. Meal planning Make sure you include foods from all food groups to eat a balanced diet. Eat a variety of types of foods. Eat foods and drink beverages that do not cause you discomfort. These may include soups and broths with cooked meats, pasta, and vegetables. Lifestyle Sit up after meals, avoid tight clothing, and take time to eat and chew your food slowly. Ask your health care provider whether you should take dietary supplements. General information Mildly season your foods. Some seasonings, such as cayenne pepper, vinegar, or hot sauce, may cause irritation. The foods, beverages, or seasonings to avoid should be based on individual tolerance. What foods should I eat? Fruits Canned or cooked fruit such as peaches, pears, or applesauce. Bananas. Vegetables Well-cooked vegetables. Canned or cooked vegetables such as carrots, green beans, beets, or spinach. Mashed or boiled potatoes. Grains  Hot cereals, such as cream of wheat and processed oatmeal. Rice. Bread, crackers, pasta, or tortillas made from refined white flour. Meats and other proteins  Eggs. Creamy peanut butter or other nut butters. Lean, well-cooked tender meats, such as beef, pork, chicken, or fish. Dairy Low-fat dairy products such as milk, cottage cheese, or yogurt. Beverages  Water. Herbal tea. Apple juice. Fats and oils Mild salad dressings. Canola or olive oil. Sweets and desserts Low-fat pudding, custard, or ice cream. Fruit gelatin. The items listed above may not be a complete list of foods and beverages you can eat. Contact a dietitian for more information. What foods should I avoid? Fruits Citrus fruits, such as oranges and grapefruit. Fruits with a stringy  texture. Fruits that have lots of seeds, such as kiwi or strawberries. Dried fruits. Vegetables Raw, uncooked vegetables. Salads. Grains Whole grain breads, muffins, and cereals. Meats and other proteins Tough, fibrous meats. Highly seasoned meat such as corned beef, smoked meats, or fish. Processed high-fat meats such as brats, hot dogs, or sausage. Dairy Full-fat dairy foods such as ice cream and cheese. Beverages Caffeinated drinks. Alcohol. Seasonings and condiments Strongly flavored seasonings or condiments. Hot sauce. Salsa. Other foods Spicy foods. Fried or greasy foods. Sour foods, such as pickled or fermented foods like sauerkraut. Foods high in fiber. The items listed above may not be a complete list of foods and beverages you should avoid. Contact a dietitian for more information. Summary A bland diet should be based on individual tolerance. It may consist of foods that are soft textured and do not have a lot of fat, fiber, acid, or seasonings. A bland diet may be recommended because avoiding certain foods, beverages, or spices may make you feel better. This information is not intended to replace advice given to you by your health care provider.  Make sure you discuss any questions you have with your health care provider. Document Revised: 06/13/2021 Document Reviewed: 06/13/2021 Elsevier Patient Education  [place].    If you have been instructed to have an in-person evaluation today at a local Urgent Care facility, please use the link below. It will take you to a list of all of our available [facility], including address, phone number and hours of operation. Please do not delay care.  [facility]  If you or a family member do not have a primary care provider, use the link below to schedule a visit and establish care. When you choose a  primary care physician or advanced practice provider, you gain a long-term partner in  health. Find a Primary Care Provider  Learn more about 's in-office and virtual care options: [facility]

## 2022-09-25 ENCOUNTER — Ambulatory Visit: Payer: BC Managed Care – PPO | Admitting: Family Medicine

## 2022-11-16 ENCOUNTER — Ambulatory Visit: Payer: BC Managed Care – PPO | Admitting: Family Medicine

## 2022-11-16 ENCOUNTER — Encounter: Payer: Self-pay | Admitting: Family Medicine

## 2022-11-16 VITALS — BP 122/89 | HR 93 | Temp 98.3°F | Ht 63.0 in | Wt 248.0 lb

## 2022-11-16 DIAGNOSIS — R197 Diarrhea, unspecified: Secondary | ICD-10-CM

## 2022-11-16 DIAGNOSIS — K121 Other forms of stomatitis: Secondary | ICD-10-CM | POA: Diagnosis not present

## 2022-11-16 MED ORDER — MAGIC MOUTHWASH: ORAL | 0 refills | Status: DC

## 2022-11-16 NOTE — Progress Notes (Signed)
Established Patient Office Visit  Subjective   Patient ID: Brenda Sharp, female    DOB: [birth year]  Age: 42 y.o. MRN: [id number]  Chief Complaint  Patient presents with   Follow-up    HPI Sxs started on Friday about 6 days ago. Started with sharp stabbing pain.  She says over the next couple days she started having pain that would wrap around from her abdomen into her ribs and even into her back bilaterally.  Then Monday she started noticing a few little sores inside of her mouth and then by Tuesday she started having watery heavy diarrhea.  And has continued to have diarrhea over the last 3 days.  No blood in the stools.  No fevers or chills.  She did eat anything different or unusual.  Nobody else around her has been sick.  She still feels like she has some soreness in that right upper quadrant.  She never vomited.     ROS    Objective:     BP 122/89   Pulse 93   Temp 98.3 F (36.8 C)   Ht 5\' 3"  (1.6 m)   Wt 248 lb (112.5 kg)   SpO2 98%   BMI 43.93 kg/m    Physical Exam Vitals and nursing note reviewed.  Constitutional:      Appearance: She is well-developed.  HENT:     Head: Normocephalic and atraumatic.  Cardiovascular:     Rate and Rhythm: Normal rate and regular rhythm.     Heart sounds: Normal heart sounds.  Pulmonary:     Effort: Pulmonary effort is normal.     Breath sounds: Normal breath sounds.  Abdominal:     General: Bowel sounds are normal.     Palpations: Abdomen is soft.     Tenderness: There is abdominal tenderness.     Comments: Tender over the right upper quadrant and left upper quadrant.  Most tender in the right upper quadrant.  Skin:    General: Skin is warm and dry.  Neurological:     Mental Status: She is alert and oriented to person, place, and time.  Psychiatric:        Behavior: Behavior normal.      No results found for any visits on 11/16/22.    The ASCVD Risk score (Arnett DK, et al., 2019) failed to calculate for the  following reasons:   The 2019 ASCVD risk score is only valid for ages 78 to 29    Assessment & Plan:   Problem List Items Addressed This Visit   None Visit Diagnoses     Diarrhea of presumed infectious origin    -  Primary   Relevant Orders   COMPLETE METABOLIC PANEL WITH GFR   CBC with Differential/Platelet   Lipase   GI pathogen panel by PCR, stool   Mouth ulcer       Relevant Medications   magic mouthwash SOLN      Abdominal pain and diarrhea.  Abdominal pain for about 6 days and diarrhea for 3 days with mouth ulcers.  Recommend collect sample for stool culture will check for hepatitis and pancreatitis.  Will also check a CBC with differential.  Call if develops new or worsening symptoms.  Make sure staying well-hydrated and discussed some foods that she might be able to eat to help bind the stools.  Recommend avoiding Imodium unless she just absolutely needs it.  Recommend Magic mouthwash for the mouth ulcers.  No follow-ups on file.  Alvan Culpepper, MD  

## 2022-11-16 NOTE — Patient Instructions (Signed)
Stay well-hydrated.  Try to eat things like rice or potatoes things that are really bland. Avoid dairy products. I would recommend avoiding Imodium unless you absolutely need to use it.  Sometimes it can actually prolong the diarrhea.  It will slow down temporarily but then it will come right back.

## 2022-11-17 LAB — CBC WITH DIFFERENTIAL/PLATELET
Absolute Monocytes: 374 cells/uL (ref 200–950)
Basophils Absolute: 41 cells/uL (ref 0–200)
Basophils Relative: 0.6 %
Eosinophils Absolute: 122 cells/uL (ref 15–500)
Eosinophils Relative: 1.8 %
HCT: 41.4 % (ref 35.0–45.0)
Hemoglobin: 12.9 g/dL (ref 11.7–15.5)
Lymphs Abs: 2700 cells/uL (ref 850–3900)
MCH: 25 pg — ABNORMAL LOW (ref 27.0–33.0)
MCHC: 31.2 g/dL — ABNORMAL LOW (ref 32.0–36.0)
MCV: 80.4 fL (ref 80.0–100.0)
MPV: 9.1 fL (ref 7.5–12.5)
Monocytes Relative: 5.5 %
Neutro Abs: 3563 cells/uL (ref 1500–7800)
Neutrophils Relative %: 52.4 %
Platelets: 439 10*3/uL — ABNORMAL HIGH (ref 140–400)
RBC: 5.15 10*6/uL — ABNORMAL HIGH (ref 3.80–5.10)
RDW: 16.7 % — ABNORMAL HIGH (ref 11.0–15.0)
Total Lymphocyte: 39.7 %
WBC: 6.8 10*3/uL (ref 3.8–10.8)

## 2022-11-17 LAB — COMPLETE METABOLIC PANEL WITH GFR
AG Ratio: 1.6 (calc) (ref 1.0–2.5)
ALT: 13 U/L (ref 6–29)
AST: 15 U/L (ref 10–30)
Albumin: 4.3 g/dL (ref 3.6–5.1)
Alkaline phosphatase (APISO): 74 U/L (ref 31–125)
BUN: 11 mg/dL (ref 7–25)
CO2: 27 mmol/L (ref 20–32)
Calcium: 9.3 mg/dL (ref 8.6–10.2)
Chloride: 104 mmol/L (ref 98–110)
Creat: 0.88 mg/dL (ref 0.50–0.97)
Globulin: 2.7 g/dL (calc) (ref 1.9–3.7)
Glucose, Bld: 98 mg/dL (ref 65–99)
Potassium: 4.3 mmol/L (ref 3.5–5.3)
Sodium: 142 mmol/L (ref 135–146)
Total Bilirubin: 0.3 mg/dL (ref 0.2–1.2)
Total Protein: 7 g/dL (ref 6.1–8.1)
eGFR: 86 mL/min/{1.73_m2} (ref >=60)

## 2022-11-17 LAB — LIPASE: Lipase: 22 U/L (ref 7–60)

## 2022-11-17 NOTE — Progress Notes (Signed)
Hi Brenda Sharp, no anemia.  No sign of acute infection which is reassuring.  That still does not rule out an infection in the colon but at least it is not systemic.  No sign of hepatitis.  Your electrolytes are normal which is great.  Lipase also looks normal so no sign of pancreatitis.  Still awaiting the stool sample.

## 2023-04-20 ENCOUNTER — Ambulatory Visit: Payer: BC Managed Care – PPO | Admitting: Family Medicine

## 2023-05-04 ENCOUNTER — Other Ambulatory Visit: Payer: Self-pay | Admitting: *Deleted

## 2023-05-04 DIAGNOSIS — M1A0621 Idiopathic chronic gout, left knee, with tophus (tophi): Secondary | ICD-10-CM

## 2023-05-04 DIAGNOSIS — I10 Essential (primary) hypertension: Secondary | ICD-10-CM

## 2023-05-04 DIAGNOSIS — F3341 Major depressive disorder, recurrent, in partial remission: Secondary | ICD-10-CM

## 2023-05-04 DIAGNOSIS — E038 Other specified hypothyroidism: Secondary | ICD-10-CM

## 2023-05-04 MED ORDER — DULOXETINE HCL 60 MG PO CPEP: ORAL_CAPSULE | ORAL | 0 refills | Status: DC

## 2023-05-04 MED ORDER — BUPROPION HCL ER (XL) 150 MG PO TB24: ORAL_TABLET | ORAL | 0 refills | Status: DC

## 2023-05-04 MED ORDER — NIFEDIPINE ER OSMOTIC RELEASE 60 MG PO TB24: 60.0000 mg | ORAL_TABLET | Freq: Every day | ORAL | 0 refills | Status: DC

## 2023-05-04 MED ORDER — LEVOTHYROXINE SODIUM 112 MCG PO TABS: ORAL_TABLET | ORAL | 0 refills | Status: DC

## 2023-05-04 MED ORDER — FEBUXOSTAT 80 MG PO TABS: 1.0000 | ORAL_TABLET | Freq: Every day | ORAL | 0 refills | Status: DC

## 2023-05-15 ENCOUNTER — Telehealth: Payer: Self-pay

## 2023-05-15 NOTE — Telephone Encounter (Signed)
Agree with below

## 2023-05-15 NOTE — Telephone Encounter (Signed)
Byron reports chest pain and elevated blood pressure. Her blood pressure is 189/146. I advised her to go to the ED for evaluation.

## 2023-05-16 ENCOUNTER — Telehealth: Payer: Self-pay | Admitting: General Practice

## 2023-05-16 NOTE — Transitions of Care (Post Inpatient/ED Visit) (Signed)
05/16/2023  Name: Brenda Sharp MRN: [id number] DOB: [birth year]  Today's TOC FU Call Status: Today's TOC FU Call Status:: Successful TOC FU Call Completed TOC FU Call Complete Date: 05/16/23 Patient's Name and Date of Birth confirmed.  Transition Care Management Follow-up Telephone Call Date of Discharge: 05/15/23 Discharge Facility: Other ([facility]) Name of Other ([facility]) Discharge Facility: [facility] Type of Discharge: Emergency Department Reason for ED Visit: Cardiac Conditions Cardiac Conditions Diagnosis: Chest Pain Persisting How have you been since you were released from the hospital?: Same Any questions or concerns?: No  Items Reviewed: Did you receive and understand the discharge instructions provided?: Yes Medications obtained,verified, and reconciled?: Yes (Medications Reviewed) Any new allergies since your discharge?: No Dietary orders reviewed?: NA Do you have support at home?: Yes  Medications Reviewed Today: Medications Reviewed Today     Reviewed by Modesto Charon, RN (Registered Nurse) on 05/16/23 at [phone]  Med List Status: <None>   Medication Order Taking? Sig Documenting Provider Last Dose Status Informant  buPROPion (WELLBUTRIN XL) 150 MG 24 hr tablet [id number]  Take 1 tablet by mouth daily. Agapito Games, MD  Active   DULoxetine (CYMBALTA) 60 MG capsule [id number]  Take 1 capsule by mouth daily. Agapito Games, MD  Active   Febuxostat 80 MG TABS [id number]  Take 1 tablet (80 mg total) by mouth daily. Agapito Games, MD  Active   fluticasone [facility]) 50 MCG/ACT nasal spray [id number] No Place 1-2 sprays into both nostrils daily. Carlis Stable, [place] Taking Active   indomethacin (INDOCIN) 50 MG capsule [id number] No TAKE ONE CAPSULE BY MOUTH TWICE DAILY, WITH A MEAL AS NEEDED Agapito Games, MD Taking Active   labetalol (NORMODYNE) 300 MG tablet [id number] No Take 1 tablet by mouth twice daily. Agapito Games,  MD Taking Active   levothyroxine (SYNTHROID) 112 MCG tablet [id number]  Take 1 tablet by mouth daily before breakfast. Agapito Games, MD  Active   magic mouthwash SOLN [id number]  Mix 1:1:1 Maalox, viscous lidocaine 2%, diphenhydramine 12.5 mg / 5 mL. Swish and spit 5 mL 4 times daily as needed Agapito Games, MD  Active   metFORMIN (GLUCOPHAGE-XR) 500 MG 24 hr tablet [id number] No Take 1 tablet (500 mg total) by mouth daily with breakfast. Agapito Games, MD Taking Active   NIFEdipine (PROCARDIA XL/NIFEDICAL XL) 60 MG 24 hr tablet [id number]  Take 1 tablet (60 mg total) by mouth daily. Agapito Games, MD  Active   ondansetron [facility]) 4 MG tablet [id number] No Take 1 tablet (4 mg total) by mouth every 8 (eight) hours as needed for nausea or vomiting. Freddy Finner, NP Taking Active             Home Care and Equipment/Supplies: Were Home Health Services Ordered?: NA Any new equipment or medical supplies ordered?: NA  Functional Questionnaire: Do you need assistance with bathing/showering or dressing?: No Do you need assistance with meal preparation?: No Do you need assistance with eating?: No Do you have difficulty maintaining continence: No Do you need assistance with getting out of bed/getting out of a chair/moving?: No Do you have difficulty managing or taking your medications?: No  Follow up appointments reviewed: PCP Follow-up appointment confirmed?: Yes Date of PCP follow-up appointment?: 05/17/23 Follow-up Provider: Dr. Linford Arnold [facility] Follow-up appointment confirmed?: NA Do you need transportation to your follow-up appointment?: No Do you understand care options if your condition(s) worsen?: Yes-patient verbalized understanding  SDOH Interventions Today  Flowsheet Row Most Recent Value  SDOH Interventions   Transportation Interventions Intervention Not Indicated       SIGNATURE Modesto Charon, RN BSN Nurse Health Advisor

## 2023-05-17 ENCOUNTER — Ambulatory Visit: Payer: BC Managed Care – PPO | Admitting: Family Medicine

## 2023-05-17 VITALS — BP 138/72 | HR 73 | Ht 63.0 in | Wt 261.0 lb

## 2023-05-17 DIAGNOSIS — E038 Other specified hypothyroidism: Secondary | ICD-10-CM | POA: Diagnosis not present

## 2023-05-17 DIAGNOSIS — F3341 Major depressive disorder, recurrent, in partial remission: Secondary | ICD-10-CM | POA: Diagnosis not present

## 2023-05-17 DIAGNOSIS — R0789 Other chest pain: Secondary | ICD-10-CM

## 2023-05-17 DIAGNOSIS — E088 Diabetes mellitus due to underlying condition with unspecified complications: Secondary | ICD-10-CM | POA: Diagnosis not present

## 2023-05-17 DIAGNOSIS — M1A0621 Idiopathic chronic gout, left knee, with tophus (tophi): Secondary | ICD-10-CM | POA: Diagnosis not present

## 2023-05-17 DIAGNOSIS — I10 Essential (primary) hypertension: Secondary | ICD-10-CM | POA: Diagnosis not present

## 2023-05-17 DIAGNOSIS — R221 Localized swelling, mass and lump, neck: Secondary | ICD-10-CM

## 2023-05-17 LAB — POCT GLYCOSYLATED HEMOGLOBIN (HGB A1C): Hemoglobin A1C: 6.4 % — AB (ref 4.0–5.6)

## 2023-05-17 LAB — POCT UA - MICROALBUMIN
Albumin/Creatinine Ratio, Urine, POC: <30
Creatinine, POC: 200 mg/dL
Microalbumin Ur, POC: 10 mg/L

## 2023-05-17 MED ORDER — LEVOTHYROXINE SODIUM 112 MCG PO TABS
ORAL_TABLET | ORAL | 0 refills | Status: DC
Start: 2023-05-17 — End: 2023-05-22

## 2023-05-17 MED ORDER — LOSARTAN POTASSIUM 100 MG PO TABS
100.0000 mg | ORAL_TABLET | Freq: Every day | ORAL | 1 refills | Status: DC
Start: 2023-05-17 — End: 2024-01-03

## 2023-05-17 MED ORDER — FEBUXOSTAT 80 MG PO TABS: 1.0000 | ORAL_TABLET | Freq: Every day | ORAL | 1 refills | Status: DC

## 2023-05-17 MED ORDER — LOSARTAN POTASSIUM-HCTZ 100-25 MG PO TABS
1.0000 | ORAL_TABLET | Freq: Every day | ORAL | 1 refills | Status: DC
Start: 2023-05-17 — End: 2023-05-17

## 2023-05-17 MED ORDER — DULOXETINE HCL 60 MG PO CPEP: ORAL_CAPSULE | ORAL | 3 refills | Status: DC

## 2023-05-17 MED ORDER — BUPROPION HCL ER (XL) 150 MG PO TB24: ORAL_TABLET | ORAL | 3 refills | Status: DC

## 2023-05-17 MED ORDER — METFORMIN HCL ER 500 MG PO TB24: 500.0000 mg | ORAL_TABLET | Freq: Every day | ORAL | 3 refills | Status: AC

## 2023-05-17 NOTE — Progress Notes (Signed)
Established Patient Office Visit  Subjective   Patient ID: Brenda Sharp, female    DOB: Dec 03, 1980  Age: 42 y.o. MRN: [id number]  Chief Complaint  Patient presents with   Follow-up         HPI  Brenda Sharp went to the emergency department 2 days ago on October 8 for sharp mid chest pain.  She had had the pain for about 3 weeks.  She been taking her blood pressure medications regularly.  CBC, CMP were normal troponins were normal.  D-dimer was normal.  Chest x-ray was normal. EKG showed:  Diagnosis Normal sinus rhythm  Nonspecific ST abnormality : T wave inversions III  mild ST depression in inferolateral leads  Abnormal ECG   Diabetes - no hypoglycemic events. No wounds or sores that are not healing well. No increased thirst or urination. Checking glucose at home. Taking medications as prescribed without any side effects.     ROS    Objective:     BP 138/72   Pulse 73   Ht 5\' 3"  (1.6 m)   Wt 261 lb (118.4 kg)   SpO2 100%   BMI 46.23 kg/m    Physical Exam Constitutional:      Appearance: Normal appearance.  HENT:     Head: Normocephalic and atraumatic.     Right Ear: Tympanic membrane, ear canal and external ear normal. There is no impacted cerumen.     Left Ear: Tympanic membrane, ear canal and external ear normal. There is no impacted cerumen.     Nose: Nose normal.     Mouth/Throat:     Pharynx: Oropharynx is clear.  Eyes:     Conjunctiva/sclera: Conjunctivae normal.  Cardiovascular:     Rate and Rhythm: Normal rate and regular rhythm.  Pulmonary:     Effort: Pulmonary effort is normal.     Breath sounds: Normal breath sounds.  Musculoskeletal:     Cervical back: Neck supple. No tenderness.     Comments: Tender directly over her chest wall mildly tender along the edge of the right sternum and very tender over the left edge of the sternum over the costochondral joints.  Lymphadenopathy:     Cervical: No cervical adenopathy.  Skin:    General: Skin is warm  and dry.  Neurological:     Mental Status: She is alert and oriented to person, place, and time.  Psychiatric:        Mood and Affect: Mood normal.      Results for orders placed or performed in visit on 05/17/23  POCT HgB A1C  Result Value Ref Range   Hemoglobin A1C 6.4 (A) 4.0 - 5.6 %   HbA1c POC (<> result, manual entry)     HbA1c, POC (prediabetic range)     HbA1c, POC (controlled diabetic range)    POCT UA - Microalbumin  Result Value Ref Range   Microalbumin Ur, POC 10 mg/L   Creatinine, POC 200 mg/dL   Albumin/Creatinine Ratio, Urine, POC <30       The ASCVD Risk score (Arnett DK, et al., 2019) failed to calculate for the following reasons:   The 2019 ASCVD risk score is only valid for ages 74 to 89    Assessment & Plan:   Problem List Items Addressed This Visit       Cardiovascular and Mediastinum   Essential hypertension, benign - Primary    Blood pressure is up a little bit I like to see it under 130.  She says she is not planning on having anymore children so she is okay with changing her medication.  Super, discontinue the labetalol and the nifedipine and switch to losartan 100 mg daily.  Will avoid HCTZ because of her history of gout if that is not as controlled as we would like then plan to add a low-dose of amlodipine.      Relevant Medications   metFORMIN (GLUCOPHAGE-XR) 500 MG 24 hr tablet   losartan (COZAAR) 100 MG tablet     Endocrine   Hypothyroidism    To check TSH today she has had a rapid weight gain in the last 3 weeks since the atypical chest pain started which is little unusual.  She is currently taking 112 mcg daily.  Also feel a lump right next to her thyroid gland on the right side unclear if it is connected to the thyroid gland or if it may be a separate lesion such as a lymph node or lipoma.  But we will move forward with Doppler for further workup.      Relevant Medications   levothyroxine (SYNTHROID) 112 MCG tablet   Diabetes mellitus  due to underlying condition with unspecified complications (HCC)    A1c up just slightly from her prior.  Just continue to work on healthy diet and regular exercise since her A1c is under 7 we did not make any medication changes today.  Just make sure continuing to take metformin regularly.  Refill sent to pharmacy.  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  Continue current regimen.  Follow-up in 6 months.      Relevant Medications   buPROPion (WELLBUTRIN XL) 150 MG 24 hr tablet   DULoxetine (CYMBALTA) 60 MG capsule   Other Visit Diagnoses     Neck mass       Relevant Orders   US THYROID   TSH   Costochondral chest pain          Costochondral chest pain-she has tenderness on exam with pressure to the chest wall.  She had negative workup at the ED.  Return in about 1 week (around 05/24/2023) for Hypertension.   I spent 40 minutes on the day of the encounter to include pre-visit record review, face-to-face time with the patient and post visit ordering of test.   Brenda Gasser, MD

## 2023-05-17 NOTE — Patient Instructions (Signed)
Take Aleve 2 tabs a day for 5 days.  If you are feeling somewhat better then okay to decrease down to 1 tab twice a day.

## 2023-05-17 NOTE — Assessment & Plan Note (Signed)
To check TSH today she has had a rapid weight gain in the last 3 weeks since the atypical chest pain started which is little unusual.  She is currently taking 112 mcg daily.  Also feel a lump right next to her thyroid gland on the right side unclear if it is connected to the thyroid gland or if it may be a separate lesion such as a lymph node or lipoma.  But we will move forward with Doppler for further workup.

## 2023-05-17 NOTE — Assessment & Plan Note (Signed)
Blood pressure is up a little bit I like to see it under 130.  She says she is not planning on having anymore children so she is okay with changing her medication.  Super, discontinue the labetalol and the nifedipine and switch to losartan 100 mg daily.  Will avoid HCTZ because of her history of gout if that is not as controlled as we would like then plan to add a low-dose of amlodipine.

## 2023-05-17 NOTE — Assessment & Plan Note (Signed)
A1c up just slightly from her prior.  Just continue to work on healthy diet and regular exercise since her A1c is under 7 we did not make any medication changes today.  Just make sure continuing to take metformin regularly.  Refill sent to pharmacy.  Lab Results  Component Value Date   HGBA1C 6.4 (A) 05/17/2023

## 2023-05-17 NOTE — Assessment & Plan Note (Signed)
Doing well with Cymbalta.  Continue current regimen.  Follow-up in 6 months.

## 2023-05-18 LAB — TSH: TSH: 4.68 u[IU]/mL — ABNORMAL HIGH (ref 0.450–4.500)

## 2023-05-21 ENCOUNTER — Ambulatory Visit: Payer: BC Managed Care – PPO

## 2023-05-21 ENCOUNTER — Other Ambulatory Visit: Payer: Self-pay | Admitting: Family Medicine

## 2023-05-21 DIAGNOSIS — F3341 Major depressive disorder, recurrent, in partial remission: Secondary | ICD-10-CM

## 2023-05-21 DIAGNOSIS — E088 Diabetes mellitus due to underlying condition with unspecified complications: Secondary | ICD-10-CM

## 2023-05-21 DIAGNOSIS — R221 Localized swelling, mass and lump, neck: Secondary | ICD-10-CM

## 2023-05-21 DIAGNOSIS — R0789 Other chest pain: Secondary | ICD-10-CM

## 2023-05-21 DIAGNOSIS — M1A0621 Idiopathic chronic gout, left knee, with tophus (tophi): Secondary | ICD-10-CM

## 2023-05-21 DIAGNOSIS — E038 Other specified hypothyroidism: Secondary | ICD-10-CM

## 2023-05-21 DIAGNOSIS — I10 Essential (primary) hypertension: Secondary | ICD-10-CM

## 2023-05-22 ENCOUNTER — Encounter: Payer: Self-pay | Admitting: Family Medicine

## 2023-05-22 DIAGNOSIS — E038 Other specified hypothyroidism: Secondary | ICD-10-CM

## 2023-05-22 MED ORDER — LEVOTHYROXINE SODIUM 125 MCG PO TABS
125.0000 ug | ORAL_TABLET | Freq: Every day | ORAL | 0 refills | Status: DC
Start: 2023-05-22 — End: 2023-10-26

## 2023-05-22 NOTE — Progress Notes (Signed)
Okay recommend take 1-1/2 tabs 1 day a week.  So for example you would take 1-1/2 tabs on Sundays and then the other 6 days of the week still just take 1.  Repeat this pattern each week and then we can recheck the level in about 2 months.

## 2023-05-22 NOTE — Progress Notes (Signed)
Hi Brenda Sharp, thyroid level was elevated at 4.6.  A year ago it looked great at 1.3.  So we need to make an adjustment to your medication and bump up your dose.  Please just verify exactly how you are taking your current regimen so that we can make a change and then plan to recheck in 6 weeks.

## 2023-05-28 NOTE — Progress Notes (Signed)
Hi Keena, you do have some swollen lymph nodes which they feel are probably related to just some inflammation because there are several of them it is not just 1.  They did recommend that we follow this up and plan to recheck the area in about 3 to 4 weeks.  Do that here in our office.

## 2023-05-29 ENCOUNTER — Encounter: Payer: Self-pay | Admitting: Family Medicine

## 2023-05-29 ENCOUNTER — Ambulatory Visit: Payer: BC Managed Care – PPO | Admitting: Family Medicine

## 2023-05-29 VITALS — BP 131/74 | HR 109 | Ht 63.0 in | Wt 255.0 lb

## 2023-05-29 DIAGNOSIS — R590 Localized enlarged lymph nodes: Secondary | ICD-10-CM

## 2023-05-29 DIAGNOSIS — I10 Essential (primary) hypertension: Secondary | ICD-10-CM | POA: Diagnosis not present

## 2023-05-29 DIAGNOSIS — J069 Acute upper respiratory infection, unspecified: Secondary | ICD-10-CM

## 2023-05-29 MED ORDER — BENZONATATE 200 MG PO CAPS: 200.0000 mg | ORAL_CAPSULE | Freq: Three times a day (TID) | ORAL | 0 refills | Status: DC | PRN

## 2023-05-29 NOTE — Assessment & Plan Note (Signed)
We did discuss options including cutting back on the losartan to half a tab at least through the weekend until she is feeling better in regards to her cold.  At that point if she is doing well then increase to try to increase back up to a whole tab sometime next week.  If she still feels like it is causing dizziness then we could also consider dropping down to 50 mg and adding a low-dose of hydrochlorothiazide, or switching the medication completely.

## 2023-05-29 NOTE — Progress Notes (Signed)
   Established Patient Office Visit  Subjective   Patient ID: Brenda Sharp, female    DOB: July 09, 1981  Age: 42 y.o. MRN: [id number]  Chief Complaint  Patient presents with   Hypertension    HPI  Hypertension- Pt denies chest pain, SOB, dizziness, or heart palpitations.  He certainly switched her to losartan since she is no longer going planning on pregnant.  Since starting the losartan she has felt a little dizzy but she also started getting some sinus congestion last Thursday and is not sure how much of it is the medication and how much of it could be from the nasal congestion.  F/U neck US.  Ultrasound just showed some multiple mildly prominent lymph nodes measuring up to 0.8 cm none greater than 1 cm.  Most likely reactive/inflammatory.    ROS    Objective:     BP 131/74   Pulse (!) 109   Ht 5\' 3"  (1.6 m)   Wt 255 lb (115.7 kg)   SpO2 100%   BMI 45.17 kg/m    Physical Exam Vitals reviewed.  Constitutional:      Appearance: Normal appearance.  HENT:     Head: Normocephalic.  Pulmonary:     Effort: Pulmonary effort is normal.  Neurological:     Mental Status: She is alert and oriented to person, place, and time.  Psychiatric:        Mood and Affect: Mood normal.        Behavior: Behavior normal.      No results found for any visits on 05/29/23.    The ASCVD Risk score (Arnett DK, et al., 2019) failed to calculate for the following reasons:   The 2019 ASCVD risk score is only valid for ages 42 to 3    Assessment & Plan:   Problem List Items Addressed This Visit       Cardiovascular and Mediastinum   Essential hypertension, benign    We did discuss options including cutting back on the losartan to half a tab at least through the weekend until she is feeling better in regards to her cold.  At that point if she is doing well then increase to try to increase back up to a whole tab sometime next week.  If she still feels like it is causing dizziness then we  could also consider dropping down to 50 mg and adding a low-dose of hydrochlorothiazide, or switching the medication completely.      Other Visit Diagnoses     Cervical lymphadenopathy    -  Primary   Viral upper respiratory tract infection           Lymphadenopathy-recommend follow-up in 4 to 6 weeks and we will recheck the area and see if it has improved.  We can always repeat ultrasound if needed.  Respiratory infection-most likely viral.  Started with a sore throat drainage and then cough.  No fever.  She did not test for COVID.  Sent over prescription for Tessalon Perles to help with the cough as it has been exacerbating her chest discomfort.  If not better after the weekend then please let us know.  Return in about 1 month (around 06/29/2023) for Recheck LNs in neck .    Nani Gasser, MD

## 2023-06-08 ENCOUNTER — Ambulatory Visit: Payer: BC Managed Care – PPO | Admitting: Family Medicine

## 2023-07-12 ENCOUNTER — Ambulatory Visit: Payer: BC Managed Care – PPO | Admitting: Family Medicine

## 2023-07-20 ENCOUNTER — Encounter: Payer: Self-pay | Admitting: Family Medicine

## 2023-07-30 ENCOUNTER — Ambulatory Visit: Payer: BC Managed Care – PPO | Admitting: Family Medicine

## 2023-08-17 ENCOUNTER — Ambulatory Visit: Payer: 59 | Admitting: Family Medicine

## 2023-09-13 ENCOUNTER — Ambulatory Visit: Payer: 59 | Admitting: Family Medicine

## 2023-09-28 ENCOUNTER — Ambulatory Visit: Payer: 59 | Admitting: Family Medicine

## 2023-10-04 ENCOUNTER — Encounter: Payer: Self-pay | Admitting: Family Medicine

## 2023-10-04 DIAGNOSIS — E088 Diabetes mellitus due to underlying condition with unspecified complications: Secondary | ICD-10-CM

## 2023-10-05 MED ORDER — OZEMPIC (0.25 OR 0.5 MG/DOSE) 2 MG/3ML ~~LOC~~ SOPN
0.2500 mg | PEN_INJECTOR | SUBCUTANEOUS | 0 refills | Status: DC
Start: 2023-10-05 — End: 2023-11-30

## 2023-10-24 ENCOUNTER — Encounter: Payer: Self-pay | Admitting: Family Medicine

## 2023-10-25 ENCOUNTER — Telehealth: Payer: Self-pay | Admitting: Family Medicine

## 2023-10-25 ENCOUNTER — Ambulatory Visit (INDEPENDENT_AMBULATORY_CARE_PROVIDER_SITE_OTHER): Payer: 59 | Admitting: Family Medicine

## 2023-10-25 VITALS — BP 155/99 | HR 75 | Ht 63.0 in | Wt 259.0 lb

## 2023-10-25 DIAGNOSIS — F3341 Major depressive disorder, recurrent, in partial remission: Secondary | ICD-10-CM

## 2023-10-25 DIAGNOSIS — E088 Diabetes mellitus due to underlying condition with unspecified complications: Secondary | ICD-10-CM

## 2023-10-25 DIAGNOSIS — R221 Localized swelling, mass and lump, neck: Secondary | ICD-10-CM

## 2023-10-25 DIAGNOSIS — E038 Other specified hypothyroidism: Secondary | ICD-10-CM | POA: Diagnosis not present

## 2023-10-25 DIAGNOSIS — R590 Localized enlarged lymph nodes: Secondary | ICD-10-CM

## 2023-10-25 LAB — POCT GLYCOSYLATED HEMOGLOBIN (HGB A1C): Hemoglobin A1C: 6.6 % — AB (ref 4.0–5.6)

## 2023-10-25 NOTE — Progress Notes (Signed)
 Acute Office Visit  Subjective:     Patient ID: Brenda Sharp, female    DOB: 03/07/1981, 43 y.o.   MRN: [id number]  Chief Complaint  Patient presents with   Follow-up    Pt reports that her lymph node is tender on the R side she denies any problems with swallowing no f/s/c. Pt reports that she has been taking Levothyroxine 1 tab 6 days and  2 tablets on Sunday.      HPI Patient is in today for mood -and more irritable especially in the evenings if she is having any pain or discomfort it really just sets or on edge.  She says in fact she thought maybe she was pregnant because her period was late but then she ended up having her period she is currently on Wellbutrin 150 mg and Cymbalta 60 mg.  Diabetes follow-up-they have not been able to get the semaglutide covered.  A1c today was up a little bit at 6.6.  She also still feels like that hard knot and sometimes tight feeling just underneath the right jaw on the right anterior side of the neck.  Did have a swollen lymph node confirmed on ultrasound back in October they felt like it looked like just inflammation but did recommend that we follow it since she still having some discomfort we will go ahead and get an updated ultrasound.  ROS      Objective:    BP (!) 155/99   Pulse 75   Ht 5\' 3"  (1.6 m)   Wt 259 lb (117.5 kg)   SpO2 98%   BMI 45.88 kg/m    Physical Exam  Results for orders placed or performed in visit on 10/25/23  POCT HgB A1C  Result Value Ref Range   Hemoglobin A1C 6.6 (A) 4.0 - 5.6 %   HbA1c POC (<> result, manual entry)     HbA1c, POC (prediabetic range)     HbA1c, POC (controlled diabetic range)          Assessment & Plan:   Problem List Items Addressed This Visit       Endocrine   Hypothyroidism   Last 2 months she has actually had a pretty rapid weight gain.  She has not changed anything with her diet or activity level.  And she has noticed a significant shift in her mood.  Will check thyroid  levels and make an adjustment today if needed.      Relevant Orders   TSH + free T4   CBC with Differential   Diabetes mellitus due to underlying condition with unspecified complications (HCC) - Primary   1C is 6.6 today with a BMI of 45.  It sounds like the semaglutide/Ozempic is in limbo at the pharmacy.  Several going to call the insurance and see if it is a prior authorization issue.  I am wondering if it still is pending approval but it should have been approved by now.  To work on [employer] and regular exercise until then.      Relevant Orders   TSH + free T4   CBC with Differential   POCT HgB A1C (Completed)     Other   Recurrent major depressive disorder, in partial remission (HCC)   Wanted check her thyroid function first but if everything comes back normal then we could look at adjusting her Cymbalta dose or adjusting the Wellbutrin.  Will reach out to her once we get those results back.  Other Visit Diagnoses       Cervical lymphadenopathy       Relevant Orders   TSH + free T4   CBC with Differential   US SOFT TISSUE HEAD & NECK (NON-THYROID)   US THYROID     Neck mass       Relevant Orders   TSH + free T4   CBC with Differential   US SOFT TISSUE HEAD & NECK (NON-THYROID)   US THYROID      I do want a follow-up on the cervical lymph nodes since she still having persistent discomfort in that area so we will plan for another ultrasound to follow-up from prior 1.  I also feel like she has a little asymmetry of the thyroid gland itself with a little larger on the right side so would like to get an ultrasound of the thyroid as well and also recheck function especially with recent mood changes and weight gain.  If everything is normal in regard to function we can always circle back to adjusting her medication.  No orders of the defined types were placed in this encounter.   Return in about 6 weeks (around 12/06/2023) for New start medication.  Nani Gasser, MD

## 2023-10-25 NOTE — Assessment & Plan Note (Signed)
 1C is 6.6 today with a BMI of 45.  It sounds like the semaglutide/Ozempic is in limbo at the pharmacy.  Several going to call the insurance and see if it is a prior authorization issue.  I am wondering if it still is pending approval but it should have been approved by now.  To work on [employer] and regular exercise until then.

## 2023-10-25 NOTE — Telephone Encounter (Signed)
 Can you please check on patient's prior authorization for semaglutide.  She says the pharmacy says it still pending but she has not been able to start it and the medication was sent almost 4 weeks ago if it is not covered and they prefer one of the other GLP-1's please let me know it is for diabetes.  A1c was 6.6 today.

## 2023-10-25 NOTE — Assessment & Plan Note (Signed)
 Wanted check her thyroid function first but if everything comes back normal then we could look at adjusting her Cymbalta dose or adjusting the Wellbutrin.  Will reach out to her once we get those results back.

## 2023-10-25 NOTE — Assessment & Plan Note (Signed)
 Last 2 months she has actually had a pretty rapid weight gain.  She has not changed anything with her diet or activity level.  And she has noticed a significant shift in her mood.  Will check thyroid levels and make an adjustment today if needed.

## 2023-10-26 ENCOUNTER — Encounter: Payer: Self-pay | Admitting: Family Medicine

## 2023-10-26 ENCOUNTER — Other Ambulatory Visit: Payer: Self-pay | Admitting: Family Medicine

## 2023-10-26 DIAGNOSIS — E038 Other specified hypothyroidism: Secondary | ICD-10-CM

## 2023-10-26 LAB — CBC WITH DIFFERENTIAL/PLATELET
Basophils Absolute: 0.1 10*3/uL (ref 0.0–0.2)
Basos: 1 %
EOS (ABSOLUTE): 0.1 10*3/uL (ref 0.0–0.4)
Eos: 1 %
Hematocrit: 44.4 % (ref 34.0–46.6)
Hemoglobin: 14 g/dL (ref 11.1–15.9)
Immature Grans (Abs): 0 10*3/uL (ref 0.0–0.1)
Immature Granulocytes: 0 %
Lymphocytes Absolute: 3.2 10*3/uL — ABNORMAL HIGH (ref 0.7–3.1)
Lymphs: 40 %
MCH: 28.3 pg (ref 26.6–33.0)
MCHC: 31.5 g/dL (ref 31.5–35.7)
MCV: 90 fL (ref 79–97)
Monocytes Absolute: 0.4 10*3/uL (ref 0.1–0.9)
Monocytes: 5 %
Neutrophils Absolute: 4.3 10*3/uL (ref 1.4–7.0)
Neutrophils: 53 %
Platelets: 370 10*3/uL (ref 150–450)
RBC: 4.94 x10E6/uL (ref 3.77–5.28)
RDW: 14.7 % (ref 11.7–15.4)
WBC: 8.1 10*3/uL (ref 3.4–10.8)

## 2023-10-26 LAB — TSH+FREE T4
Free T4: 1.19 ng/dL (ref 0.82–1.77)
TSH: 4.03 u[IU]/mL (ref 0.450–4.500)

## 2023-10-26 LAB — SPECIMEN STATUS REPORT

## 2023-10-26 MED ORDER — LEVOTHYROXINE SODIUM 150 MCG PO TABS
150.0000 ug | ORAL_TABLET | Freq: Every day | ORAL | 1 refills | Status: DC
Start: 2023-10-26 — End: 2024-01-03

## 2023-10-26 NOTE — Telephone Encounter (Signed)
 The prior authorization has been approved 10/26/2023 - 10/26/2026.

## 2023-10-26 NOTE — Progress Notes (Signed)
 Hi Brenda Sharp, I am going to bump you up to the 150 mcg tab you are just going to take 1 every day and then we will plan to recheck your level in about 8 weeks.  If you do not hear back about the semaglutide by the end of next week please let me know.

## 2023-10-26 NOTE — Telephone Encounter (Signed)
 [facility] (Key: [facility]ills) Rx #: [id number] Ozempic (0.25 or 0.5 MG/DOSE) 2MG Ronny Bacon pen-injectors

## 2023-10-26 NOTE — Progress Notes (Signed)
 I would like to inc your thyroid med dose.  Please verify how you are taking your thyroid pill

## 2023-10-26 NOTE — Telephone Encounter (Signed)
Submitted PA today

## 2023-10-31 ENCOUNTER — Ambulatory Visit

## 2023-10-31 DIAGNOSIS — R590 Localized enlarged lymph nodes: Secondary | ICD-10-CM | POA: Diagnosis not present

## 2023-10-31 DIAGNOSIS — R221 Localized swelling, mass and lump, neck: Secondary | ICD-10-CM

## 2023-11-09 ENCOUNTER — Encounter: Payer: Self-pay | Admitting: Family Medicine

## 2023-11-12 ENCOUNTER — Other Ambulatory Visit: Payer: Self-pay | Admitting: Family Medicine

## 2023-11-12 ENCOUNTER — Encounter: Payer: Self-pay | Admitting: Family Medicine

## 2023-11-12 DIAGNOSIS — R590 Localized enlarged lymph nodes: Secondary | ICD-10-CM

## 2023-11-12 NOTE — Progress Notes (Signed)
 Hi Brenda Sharp, thyroid gland shows some mild enlargement but no nodules which is great news.  They did say it looked heterogeneous which is good.  They checked out the lymph nodes in that area as well and they do still look enlarged and maybe a little bit bigger than back in October.  Blood count look normal which is reassuring but because of this would like to really get you to an ENT or an ear nose and throat for further evaluation and to see if maybe they even feel like maybe we should biopsy one of the lymph nodes that is a possibility.  If you have a preference for provider or location please let me know otherwise I typically refer to [facility] here in [place].

## 2023-11-12 NOTE — Progress Notes (Signed)
Orders Placed This Encounter  Procedures   Ambulatory referral to ENT    Referral Priority:   Routine    Referral Type:   Consultation    Referral Reason:   Specialty Services Required    Requested Specialty:   Otolaryngology    Number of Visits Requested:   1    

## 2023-11-30 ENCOUNTER — Other Ambulatory Visit: Payer: Self-pay | Admitting: *Deleted

## 2023-11-30 DIAGNOSIS — E088 Diabetes mellitus due to underlying condition with unspecified complications: Secondary | ICD-10-CM

## 2023-11-30 MED ORDER — OZEMPIC (0.25 OR 0.5 MG/DOSE) 2 MG/3ML ~~LOC~~ SOPN: 0.2500 mg | PEN_INJECTOR | SUBCUTANEOUS | 0 refills | Status: DC

## 2023-12-11 ENCOUNTER — Encounter: Payer: Self-pay | Admitting: Family Medicine

## 2023-12-13 ENCOUNTER — Ambulatory Visit: Admitting: Family Medicine

## 2023-12-27 ENCOUNTER — Other Ambulatory Visit: Payer: Self-pay | Admitting: Medical Genetics

## 2024-01-03 ENCOUNTER — Ambulatory Visit (INDEPENDENT_AMBULATORY_CARE_PROVIDER_SITE_OTHER): Admitting: Family Medicine

## 2024-01-03 VITALS — BP 124/78 | HR 86 | Ht 63.0 in | Wt 251.0 lb

## 2024-01-03 DIAGNOSIS — E088 Diabetes mellitus due to underlying condition with unspecified complications: Secondary | ICD-10-CM

## 2024-01-03 DIAGNOSIS — M1A0621 Idiopathic chronic gout, left knee, with tophus (tophi): Secondary | ICD-10-CM | POA: Diagnosis not present

## 2024-01-03 DIAGNOSIS — I10 Essential (primary) hypertension: Secondary | ICD-10-CM | POA: Diagnosis not present

## 2024-01-03 DIAGNOSIS — F3341 Major depressive disorder, recurrent, in partial remission: Secondary | ICD-10-CM

## 2024-01-03 DIAGNOSIS — G479 Sleep disorder, unspecified: Secondary | ICD-10-CM

## 2024-01-03 DIAGNOSIS — E038 Other specified hypothyroidism: Secondary | ICD-10-CM

## 2024-01-03 DIAGNOSIS — Z23 Encounter for immunization: Secondary | ICD-10-CM

## 2024-01-03 DIAGNOSIS — Z7984 Long term (current) use of oral hypoglycemic drugs: Secondary | ICD-10-CM

## 2024-01-03 MED ORDER — TRAZODONE HCL 100 MG PO TABS: 50.0000 mg | ORAL_TABLET | Freq: Every day | ORAL | 0 refills | Status: DC

## 2024-01-03 MED ORDER — BUPROPION HCL ER (XL) 150 MG PO TB24
ORAL_TABLET | ORAL | 3 refills | Status: AC
Start: 2024-01-03 — End: ?

## 2024-01-03 MED ORDER — LOSARTAN POTASSIUM 100 MG PO TABS: 100.0000 mg | ORAL_TABLET | Freq: Every day | ORAL | 1 refills | Status: DC

## 2024-01-03 MED ORDER — TIRZEPATIDE 2.5 MG/0.5ML ~~LOC~~ SOAJ: 2.5000 mg | SUBCUTANEOUS | 0 refills | Status: DC

## 2024-01-03 MED ORDER — LEVOTHYROXINE SODIUM 150 MCG PO TABS: 150.0000 ug | ORAL_TABLET | Freq: Every day | ORAL | 1 refills | Status: DC

## 2024-01-03 MED ORDER — DULOXETINE HCL 60 MG PO CPEP: ORAL_CAPSULE | ORAL | 3 refills | Status: DC

## 2024-01-03 MED ORDER — FEBUXOSTAT 80 MG PO TABS: 1.0000 | ORAL_TABLET | Freq: Every day | ORAL | 1 refills | Status: AC

## 2024-01-03 NOTE — Progress Notes (Unsigned)
 Established Patient Office Visit  Subjective  Patient ID: Brenda Sharp, female    DOB: 05-Nov-1980  Age: 43 y.o. MRN: [id number]  Chief Complaint  Patient presents with   Diabetes    Pt reports that she has been on the Ozempic  for 8 weeks and she has felt nauseated,bloated and gassy the whole time.   She will have her eye exam done on 6/10    HPI  Here for follow-up diabetes and new start Ozempic . She has been on started dose for 8 weeks.  Hasn't felt well at all. Bloating, gassy, diarrhea.    Still struggling with some irritability.  She says it gets worse by the end of the day.  She is really only sleeping about 4 to 5 hours at night typically she can fall asleep well she just wakes up in the normal the night and then sometimes it takes over an hour or more to fall back asleep is usually cyclical around 3 AM.  She has tried multiple over-the-counter's like melatonin.  Benadryl  actually stimulates her so she has not used that.  {History (Optional):23778}  ROS    Objective:     BP (!) 135/94   Pulse 86   Ht 5\' 3"  (1.6 m)   Wt 251 lb (113.9 kg)   SpO2 100%   BMI 44.46 kg/m  {Vitals History (Optional):23777}  Physical Exam Vitals and nursing note reviewed.  Constitutional:      Appearance: Normal appearance.  HENT:     Head: Normocephalic and atraumatic.  Eyes:     Conjunctiva/sclera: Conjunctivae normal.  Pulmonary:     Effort: Pulmonary effort is normal.  Skin:    General: Skin is warm and dry.  Neurological:     Mental Status: She is alert.  Psychiatric:        Mood and Affect: Mood normal.      No results found for any visits on 01/03/24.  {Labs (Optional):23779}  The 10-year ASCVD risk score (Arnett DK, et al., 2019) is: 5.1%    Assessment & Plan:   Problem List Items Addressed This Visit       Cardiovascular and Mediastinum   Essential hypertension, benign   Recheck BP before leaves today.        Relevant Medications   losartan  (COZAAR )  100 MG tablet   Other Relevant Orders   Urine Microalbumin w/creat. ratio   CMP14+EGFR     Endocrine   Hypothyroidism   Relevant Medications   levothyroxine  (SYNTHROID ) 150 MCG tablet   Diabetes mellitus due to underlying condition with unspecified complications (HCC) - Primary   Because of side effects and not being able to get past the 0.5 mg dose on the Ozempic  or going to try switching to Mounjaro to see if maybe it is a little bit better tolerated.  We did discuss some strategies around smaller meals maybe even taking a stool softener if she experiences constipation though right now she has been having more diarrhea.  She is down 8 pounds from when she was last here so it has been effective for decreasing appetite.  But unfortunately not well-tolerated.      Relevant Medications   losartan  (COZAAR ) 100 MG tablet   tirzepatide (MOUNJARO) 2.5 MG/0.5ML Pen   Other Relevant Orders   Urine Microalbumin w/creat. 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    Duaine German, MD

## 2024-01-03 NOTE — Assessment & Plan Note (Signed)
 Because of side effects and not being able to get past the 0.5 mg dose on the Ozempic  or going to try switching to Mounjaro  to see if maybe it is a little bit better tolerated.  We did discuss some strategies around smaller meals maybe even taking a stool softener if she experiences constipation though right now she has been having more diarrhea.  She is down 8 pounds from when she was last here so it has been effective for decreasing appetite.  But unfortunately not well-tolerated.

## 2024-01-03 NOTE — Assessment & Plan Note (Signed)
Recheck BP before leaves today.

## 2024-01-04 ENCOUNTER — Encounter: Payer: Self-pay | Admitting: Family Medicine

## 2024-01-04 ENCOUNTER — Ambulatory Visit: Payer: Self-pay | Admitting: Family Medicine

## 2024-01-04 LAB — CMP14+EGFR
ALT: 15 IU/L (ref 0–32)
AST: 15 IU/L (ref 0–40)
Albumin: 4.4 g/dL (ref 3.9–4.9)
Alkaline Phosphatase: 74 IU/L (ref 44–121)
BUN/Creatinine Ratio: 10 (ref 9–23)
BUN: 10 mg/dL (ref 6–24)
Bilirubin Total: 0.4 mg/dL (ref 0.0–1.2)
CO2: 22 mmol/L (ref 20–29)
Calcium: 9.7 mg/dL (ref 8.7–10.2)
Chloride: 102 mmol/L (ref 96–106)
Creatinine, Ser: 0.96 mg/dL (ref 0.57–1.00)
Globulin, Total: 2.3 g/dL (ref 1.5–4.5)
Glucose: 110 mg/dL — ABNORMAL HIGH (ref 70–99)
Potassium: 4.8 mmol/L (ref 3.5–5.2)
Sodium: 141 mmol/L (ref 134–144)
Total Protein: 6.7 g/dL (ref 6.0–8.5)
eGFR: 77 mL/min/{1.73_m2} (ref >59)

## 2024-01-04 LAB — MICROALBUMIN / CREATININE URINE RATIO
Creatinine, Urine: 294.5 mg/dL
Microalb/Creat Ratio: 4 mg/g{creat} (ref 0–29)
Microalbumin, Urine: 11.5 ug/mL

## 2024-01-04 NOTE — Progress Notes (Signed)
No excess protein in the urine which is great.

## 2024-01-04 NOTE — Progress Notes (Signed)
 Metabolic panel which is the blood sample looks good.  Still awaiting the urine test for excess protein.

## 2024-01-08 ENCOUNTER — Other Ambulatory Visit (HOSPITAL_COMMUNITY)

## 2024-01-29 ENCOUNTER — Telehealth: Payer: Self-pay

## 2024-01-29 ENCOUNTER — Other Ambulatory Visit (HOSPITAL_COMMUNITY): Payer: Self-pay

## 2024-01-29 NOTE — Telephone Encounter (Signed)
 Pharmacy Patient Advocate Encounter  Received notification from [facility] that Prior Authorization for Mounjaro  2.5mg /0.54ml has been APPROVED from 01/29/24 to 01/29/27   PA #/Case ID/Reference #: [id number]

## 2024-01-29 NOTE — Telephone Encounter (Signed)
 Pharmacy Patient Advocate Encounter   Received notification from CoverMyMeds that prior authorization for Mounjaro  2.5mg /0.54ml is required/requested.   Insurance verification completed.   The patient is insured through [facility] .   Per test claim: PA required; PA started via CoverMyMeds. KEY [id number] . Waiting for clinical questions to populate.

## 2024-01-29 NOTE — Telephone Encounter (Signed)
 Clinical questions have been answered and PA submitted. PA currently Pending.

## 2024-03-06 ENCOUNTER — Ambulatory Visit

## 2024-03-06 ENCOUNTER — Encounter: Payer: Self-pay | Admitting: Family Medicine

## 2024-03-06 ENCOUNTER — Ambulatory Visit: Admitting: Family Medicine

## 2024-03-06 VITALS — BP 146/108 | HR 78 | Ht 63.0 in | Wt 251.0 lb

## 2024-03-06 DIAGNOSIS — I10 Essential (primary) hypertension: Secondary | ICD-10-CM | POA: Diagnosis not present

## 2024-03-06 DIAGNOSIS — E088 Diabetes mellitus due to underlying condition with unspecified complications: Secondary | ICD-10-CM

## 2024-03-06 DIAGNOSIS — R0789 Other chest pain: Secondary | ICD-10-CM

## 2024-03-06 DIAGNOSIS — E038 Other specified hypothyroidism: Secondary | ICD-10-CM

## 2024-03-06 LAB — POCT GLYCOSYLATED HEMOGLOBIN (HGB A1C): Hemoglobin A1C: 5.9 % — AB (ref 4.0–5.6)

## 2024-03-06 MED ORDER — AMLODIPINE BESYLATE 5 MG PO TABS: 5.0000 mg | ORAL_TABLET | Freq: Every day | ORAL | 1 refills | Status: DC

## 2024-03-06 NOTE — Assessment & Plan Note (Signed)
 1C looks phenomenal today.  Will discontinue GLP-1 and stick with metformin  for now.  Lab Results  Component Value Date   HGBA1C 5.9 (A) 03/06/2024

## 2024-03-06 NOTE — Progress Notes (Signed)
 Established Patient Office Visit  Subjective  Patient ID: Brenda Sharp, female    DOB: 1981-07-28  Age: 43 y.o. MRN: [id number]  Chief Complaint  Patient presents with   Diabetes   Hypertension    HPI  Hypertension- Pt denies chest pain, SOB, dizziness, or heart palpitations.  Taking meds as directed w/o problems.  Denies medication side effects.  Reports that her blood pressure has been more elevated than usual over the last 2 to 3 weeks she is not sure why no specific changes she did recently try the Mounjaro  but had similar side effects to the Ozempic  so stopped it about 2 weeks ago we also adjusted her thyroid  medication back in March.  But again not nothing recent.  Diabetes - no hypoglycemic events. No wounds or sores that are not healing well. No increased thirst or urination. Checking glucose at home. Taking medications as prescribed without any side effects.  Unfortunately the trazodone  caused side effects she tried it a couple different times and each time it really ramped up her pain levels so she stopped it.  She is still having some chest wall discomfort it mostly just bothers her at night.  We had previously diagnosed her with costochondritis.    ROS    Objective:     BP (!) 146/108   Pulse 78   Ht 5' 3 (1.6 m)   Wt 251 lb 0.6 oz (113.9 kg)   SpO2 100%   BMI 44.47 kg/m    Physical Exam Vitals and nursing note reviewed.  Constitutional:      Appearance: Normal appearance.  HENT:     Head: Normocephalic and atraumatic.  Eyes:     Conjunctiva/sclera: Conjunctivae normal.  Cardiovascular:     Rate and Rhythm: Normal rate and regular rhythm.  Pulmonary:     Effort: Pulmonary effort is normal.     Breath sounds: Normal breath sounds.  Skin:    General: Skin is warm and dry.  Neurological:     Mental Status: She is alert.  Psychiatric:        Mood and Affect: Mood normal.      Results for orders placed or performed in visit on 03/06/24  POCT HgB  A1C  Result Value Ref Range   Hemoglobin A1C 5.9 (A) 4.0 - 5.6 %   HbA1c POC (<> result, manual entry)     HbA1c, POC (prediabetic range)     HbA1c, POC (controlled diabetic range)        The 10-year ASCVD risk score (Arnett DK, et al., 2019) is: 5.9%    Assessment & Plan:   Problem List Items Addressed This Visit       Cardiovascular and Mediastinum   Essential hypertension, benign   Uncontrolled.  Getting similar numbers at home.  She took amlodipine  years ago so organ to add that back at 5 mg continue with losartan  100 mg.  Continue to monitor blood pressure carefully.      Relevant Medications   amLODipine  (NORVASC ) 5 MG tablet   Other Relevant Orders   TSH   Basic Metabolic Panel (BMET)     Endocrine   Hypothyroidism   Recheck TSH since we did adjust her dose about 3 or 4 months ago.      Relevant Orders   TSH   Basic Metabolic Panel (BMET)   Diabetes mellitus due to underlying condition with unspecified complications (HCC) - Primary   1C looks phenomenal today.  Will discontinue GLP-1  and stick with metformin  for now.  Lab Results  Component Value Date   HGBA1C 5.9 (A) 03/06/2024         Relevant Orders   POCT HgB A1C (Completed)   TSH   Basic Metabolic Panel (BMET)   Other Visit Diagnoses       Chest wall pain       Relevant Orders   DG Chest 2 View      Having some chest wall discomfort so we will get a plain film today.  If blood pressure is not improving pretty rapidly then consider further workup maybe even echocardiogram.  Return in about 2 weeks (around 03/20/2024) for Nurse BP Check.    Dorothyann Byars, MD

## 2024-03-06 NOTE — Assessment & Plan Note (Signed)
 Recheck TSH since we did adjust her dose about 3 or 4 months ago.

## 2024-03-06 NOTE — Assessment & Plan Note (Signed)
 Uncontrolled.  Getting similar numbers at home.  She took amlodipine  years ago so organ to add that back at 5 mg continue with losartan  100 mg.  Continue to monitor blood pressure carefully.

## 2024-03-07 ENCOUNTER — Ambulatory Visit: Payer: Self-pay | Admitting: Family Medicine

## 2024-03-07 DIAGNOSIS — E875 Hyperkalemia: Secondary | ICD-10-CM

## 2024-03-07 LAB — BASIC METABOLIC PANEL WITH GFR
BUN/Creatinine Ratio: 11 (ref 9–23)
BUN: 10 mg/dL (ref 6–24)
CO2: 21 mmol/L (ref 20–29)
Calcium: 9.3 mg/dL (ref 8.7–10.2)
Chloride: 103 mmol/L (ref 96–106)
Creatinine, Ser: 0.9 mg/dL (ref 0.57–1.00)
Glucose: 116 mg/dL — ABNORMAL HIGH (ref 70–99)
Potassium: 5.4 mmol/L — ABNORMAL HIGH (ref 3.5–5.2)
Sodium: 142 mmol/L (ref 134–144)
eGFR: 83 mL/min/1.73 (ref >59)

## 2024-03-07 LAB — TSH: TSH: 3.09 u[IU]/mL (ref 0.450–4.500)

## 2024-03-07 NOTE — Progress Notes (Signed)
 Brenda Sharp, potassium was a little elevated.  Normally it looks great it could just be a little hemolysis of the blood and not sure so like to recheck it in 2 to 3 weeks.  Thyroid  is still pending.

## 2024-03-07 NOTE — Progress Notes (Signed)
Thyroid level looks good

## 2024-03-10 ENCOUNTER — Other Ambulatory Visit: Payer: Self-pay | Admitting: Family Medicine

## 2024-03-10 DIAGNOSIS — F3341 Major depressive disorder, recurrent, in partial remission: Secondary | ICD-10-CM

## 2024-03-10 NOTE — Telephone Encounter (Signed)
 Copied from CRM [id number]. Topic: Clinical - Medication Refill >> Mar 10, 2024 11:12 AM Rosaria A wrote: Medication: DULoxetine  (CYMBALTA ) 60 MG capsule  Has the patient contacted their pharmacy? Yes  Kara at the pharmacy states that the patient requested 30 mg capsules and is wanting to verify if it is 30 mg or 60 mg for the patients refill.   This is the patient's preferred pharmacy:  [facility] - [place], [place] - [address] [place] [address] Phone: [phone] Fax: [phone]   Is this the correct pharmacy for this prescription? Yes If no, delete pharmacy and type the correct one.   Has the prescription been filled recently? No  Is the patient out of the medication? Yes  Has the patient been seen for an appointment in the last year OR does the patient have an upcoming appointment? Yes  Can we respond through MyChart? No  Agent: Please be advised that Rx refills may take up to 3 business days. We ask that you follow-up with your pharmacy.

## 2024-03-11 MED ORDER — DULOXETINE HCL 60 MG PO CPEP
ORAL_CAPSULE | ORAL | 3 refills | Status: AC
Start: 2024-03-11 — End: ?

## 2024-03-14 NOTE — Progress Notes (Signed)
 Hi Brenda Sharp, chest x-ray looks good no concerning findings this is reassuring in regards to heart and lungs.  Also let us  know when you had your last eye exam the last one we have on file was 2021 and we do want a get that updated.  Were happy to call and get your last report.

## 2024-03-20 ENCOUNTER — Ambulatory Visit

## 2024-04-29 ENCOUNTER — Telehealth: Payer: Self-pay | Admitting: Family Medicine

## 2024-04-29 NOTE — Telephone Encounter (Signed)
 Call patient and let her know now that she is 42 she really needs to start doing mammograms she is welcome to do that through her OB/GYN or downstairs in our building if that is convenient I am happy to place an order if she already has had it done please let us  know and we will call to get a copy of the report.

## 2024-04-30 NOTE — Telephone Encounter (Signed)
 Attempted call to patient. Left a voice mail message requesting a return call.

## 2024-05-12 ENCOUNTER — Telehealth: Admitting: Physician Assistant

## 2024-05-12 DIAGNOSIS — R051 Acute cough: Secondary | ICD-10-CM

## 2024-05-12 DIAGNOSIS — J028 Acute pharyngitis due to other specified organisms: Secondary | ICD-10-CM | POA: Diagnosis not present

## 2024-05-12 DIAGNOSIS — B9689 Other specified bacterial agents as the cause of diseases classified elsewhere: Secondary | ICD-10-CM | POA: Diagnosis not present

## 2024-05-12 MED ORDER — AZITHROMYCIN 250 MG PO TABS: ORAL_TABLET | ORAL | 0 refills | Status: AC

## 2024-05-12 MED ORDER — BENZONATATE 100 MG PO CAPS: 100.0000 mg | ORAL_CAPSULE | Freq: Three times a day (TID) | ORAL | 0 refills | Status: DC | PRN

## 2024-05-12 NOTE — Progress Notes (Signed)
 We are sorry that you are not feeling well.  Here is how we plan to help!  Based on what you have shared with me it is likely that you have bacterial pharyngitis.  Bacterial pharyngitis is inflammation and infection in the back of the throat.  This is an infection cause by bacteria and is treated with antibiotics.  I have prescribed Azithromycin  250 mg two tablets today and then one daily for 4 additional days. I have also prescribed Tessalon  perles 100mg  for cough. Take 1-2 capsules every 8 hours as needed for cough. For throat pain, we recommend over the counter oral pain relief medications such as acetaminophen  or aspirin, or anti-inflammatory medications such as ibuprofen  or naproxen sodium. Topical treatments such as oral throat lozenges or sprays may be used as needed. Bacterial pharyngitis infections are not as easily transmitted as other respiratory infections, however we still recommend that you avoid close contact with loved ones, especially the very young and elderly.  Remember to wash your hands thoroughly throughout the day as this is the number one way to prevent the spread of infection and wipe down door knobs and counters with disinfectant.   Home Care: Only take medications as instructed by your medical team. Complete the entire course of an antibiotic. Do not take these medications with alcohol. A steam or ultrasonic humidifier can help congestion.  You can place a towel over your head and breathe in the steam from hot water coming from a faucet. Avoid close contacts especially the very young and the elderly. Cover your mouth when you cough or sneeze. Always remember to wash your hands.  Get Help Right Away If: You develop worsening fever or sinus pain. You develop a severe head ache or visual changes. Your symptoms persist after you have completed your treatment plan.  Make sure you Understand these instructions. Will watch your condition. Will get help right away if you are  not doing well or get worse.  Your e-visit answers were reviewed by a board certified advanced clinical practitioner to complete your personal care plan.  Depending on the condition, your plan could have included both over the counter or prescription medications.  If there is a problem please reply  once you have received a response from your provider.  Your safety is important to us .  If you have drug allergies check your prescription carefully.    You can use MyChart to ask questions about today's visit, request a non-urgent call back, or ask for a work or school excuse for 24 hours related to this e-Visit. If it has been greater than 24 hours you will need to follow up with your provider, or enter a new e-Visit to address those concerns.  You will get an e-mail in the next two days asking about your experience.  I hope that your e-visit has been valuable and will speed your recovery. Thank you for using e-visits.   I have spent 5 minutes in review of e-visit questionnaire, review and updating patient chart, medical decision making and response to patient.   Delon CHRISTELLA Dickinson, PA-C

## 2024-05-19 NOTE — Telephone Encounter (Signed)
 We should attemp to call pt a couple of times before I close the chart.   Thank you

## 2024-05-20 ENCOUNTER — Other Ambulatory Visit: Payer: Self-pay | Admitting: Medical Genetics

## 2024-05-20 DIAGNOSIS — Z006 Encounter for examination for normal comparison and control in clinical research program: Secondary | ICD-10-CM

## 2024-05-20 NOTE — Telephone Encounter (Signed)
 LVM advising pt of recommendations and to reach out via mychart or return call.

## 2024-05-22 NOTE — Telephone Encounter (Signed)
 Attempted call to patient. Left a voice mail message requesting a return call.

## 2024-05-26 NOTE — Telephone Encounter (Signed)
 Letter placed in mail to patient

## 2024-06-08 ENCOUNTER — Other Ambulatory Visit: Payer: Self-pay | Admitting: Family Medicine

## 2024-06-08 DIAGNOSIS — I10 Essential (primary) hypertension: Secondary | ICD-10-CM

## 2024-07-01 ENCOUNTER — Other Ambulatory Visit: Payer: Self-pay | Admitting: Family Medicine

## 2024-07-01 DIAGNOSIS — E038 Other specified hypothyroidism: Secondary | ICD-10-CM

## 2024-07-23 ENCOUNTER — Telehealth: Admitting: Physician Assistant

## 2024-07-23 DIAGNOSIS — J208 Acute bronchitis due to other specified organisms: Secondary | ICD-10-CM | POA: Diagnosis not present

## 2024-07-23 MED ORDER — BENZONATATE 100 MG PO CAPS: 100.0000 mg | ORAL_CAPSULE | Freq: Three times a day (TID) | ORAL | 0 refills | Status: DC | PRN

## 2024-07-23 MED ORDER — PREDNISONE 10 MG (21) PO TBPK: ORAL_TABLET | ORAL | 0 refills | Status: DC

## 2024-07-23 NOTE — Progress Notes (Signed)
 We are sorry that you are not feeling well.  Here is how we plan to help!  Based on your presentation I believe you most likely have A cough due to a virus.  This is called viral bronchitis and is best treated by rest, plenty of fluids and control of the cough.  You may use Ibuprofen or Tylenol  as directed to help your symptoms.     In addition you may use A non-prescription cough medication called Mucinex DM: take 2 tablets every 12 hours. and A prescription cough medication called Tessalon  Perles 100mg . You may take 1-2 capsules every 8 hours as needed for your cough.  Prednisone  10 mg daily for 6 days (see taper instructions below)  Directions for 6 day taper: Day 1: 2 tablets before breakfast, 1 after both lunch & dinner and 2 at bedtime Day 2: 1 tab before breakfast, 1 after both lunch & dinner and 2 at bedtime Day 3: 1 tab at each meal & 1 at bedtime Day 4: 1 tab at breakfast, 1 at lunch, 1 at bedtime Day 5: 1 tab at breakfast & 1 tab at bedtime Day 6: 1 tab at breakfast  From your responses in the eVisit questionnaire you describe inflammation in the upper respiratory tract which is causing a significant cough.  This is commonly called Bronchitis and has four common causes:   Allergies Viral Infections Acid Reflux Bacterial Infection Allergies, viruses and acid reflux are treated by controlling symptoms or eliminating the cause. An example might be a cough caused by taking certain blood pressure medications. You stop the cough by changing the medication. Another example might be a cough caused by acid reflux. Controlling the reflux helps control the cough.  USE OF BRONCHODILATOR (RESCUE) INHALERS: There is a risk from using your bronchodilator too frequently.  The risk is that over-reliance on a medication which only relaxes the muscles surrounding the breathing tubes can reduce the effectiveness of medications prescribed to reduce swelling and congestion of the tubes themselves.   Although you feel brief relief from the bronchodilator inhaler, your asthma may actually be worsening with the tubes becoming more swollen and filled with mucus.  This can delay other crucial treatments, such as oral steroid medications. If you need to use a bronchodilator inhaler daily, several times per day, you should discuss this with your provider.  There are probably better treatments that could be used to keep your asthma under control.     HOME CARE Only take medications as instructed by your medical team. Complete the entire course of an antibiotic. Drink plenty of fluids and get plenty of rest. Avoid close contacts especially the very young and the elderly Cover your mouth if you cough or cough into your sleeve. Always remember to wash your hands A steam or ultrasonic humidifier can help congestion.   GET HELP RIGHT AWAY IF: You develop worsening fever. You become short of breath You cough up blood. Your symptoms persist after you have completed your treatment plan MAKE SURE YOU  Understand these instructions. Will watch your condition. Will get help right away if you are not doing well or get worse.  Your e-visit answers were reviewed by a board certified advanced clinical practitioner to complete your personal care plan.  Depending on the condition, your plan could have included both over the counter or prescription medications. If there is a problem please reply  once you have received a response from your provider. Your safety is important to us .  If you have drug allergies check your prescription carefully.    You can use MyChart to ask questions about today's visit, request a non-urgent call back, or ask for a work or school excuse for 24 hours related to this e-Visit. If it has been greater than 24 hours you will need to follow up with your provider, or enter a new e-Visit to address those concerns. You will get an e-mail in the next two days asking about your experience.   I hope that your e-visit has been valuable and will speed your recovery. Thank you for using e-visits.   I have spent 5 minutes in review of e-visit questionnaire, review and updating patient chart, medical decision making and response to patient.   Delon CHRISTELLA Dickinson, PA-C

## 2024-08-01 ENCOUNTER — Telehealth: Admitting: Family Medicine

## 2024-08-01 DIAGNOSIS — R051 Acute cough: Secondary | ICD-10-CM | POA: Diagnosis not present

## 2024-08-01 DIAGNOSIS — J029 Acute pharyngitis, unspecified: Secondary | ICD-10-CM | POA: Diagnosis not present

## 2024-08-01 DIAGNOSIS — J208 Acute bronchitis due to other specified organisms: Secondary | ICD-10-CM | POA: Diagnosis not present

## 2024-08-01 MED ORDER — FLUTICASONE PROPIONATE 50 MCG/ACT NA SUSP: 2.0000 | Freq: Every day | NASAL | 6 refills | Status: AC

## 2024-08-01 MED ORDER — LIDOCAINE VISCOUS HCL 2 % MT SOLN: 15.0000 mL | OROMUCOSAL | 0 refills | Status: AC | PRN

## 2024-08-01 MED ORDER — BENZONATATE 200 MG PO CAPS: 200.0000 mg | ORAL_CAPSULE | Freq: Two times a day (BID) | ORAL | 0 refills | Status: DC | PRN

## 2024-08-01 NOTE — Progress Notes (Signed)

## 2024-08-15 ENCOUNTER — Telehealth: Payer: Self-pay | Admitting: Family Medicine

## 2024-08-15 NOTE — Telephone Encounter (Signed)
 Pld call pt and get her scheduled for Diabetes and BP follow up  Also see where goes for gyn. We need to get her last pap and mammo abstracted

## 2024-08-18 NOTE — Telephone Encounter (Signed)
 Called and left a detailed voicemail message - also requesting a return call for scheduling and to let us  know who her gynecologist is so we can get the last PAP in chart  as well. Left both main # and direct # for return call.

## 2024-08-25 ENCOUNTER — Encounter: Payer: Self-pay | Admitting: Family Medicine

## 2024-08-25 ENCOUNTER — Ambulatory Visit: Admitting: Family Medicine

## 2024-08-25 VITALS — BP 140/82 | HR 85 | Ht 63.0 in | Wt 251.0 lb

## 2024-08-25 DIAGNOSIS — M1A0621 Idiopathic chronic gout, left knee, with tophus (tophi): Secondary | ICD-10-CM

## 2024-08-25 DIAGNOSIS — I10 Essential (primary) hypertension: Secondary | ICD-10-CM

## 2024-08-25 DIAGNOSIS — E088 Diabetes mellitus due to underlying condition with unspecified complications: Secondary | ICD-10-CM

## 2024-08-25 DIAGNOSIS — J0100 Acute maxillary sinusitis, unspecified: Secondary | ICD-10-CM

## 2024-08-25 MED ORDER — SPIRONOLACTONE 25 MG PO TABS: 25.0000 mg | ORAL_TABLET | Freq: Every day | ORAL | 0 refills | Status: AC

## 2024-08-25 MED ORDER — DOXYCYCLINE HYCLATE 100 MG PO TABS: 100.0000 mg | ORAL_TABLET | Freq: Two times a day (BID) | ORAL | 0 refills | Status: AC

## 2024-08-25 NOTE — Patient Instructions (Signed)
 Return for lab in one week once start the spironolactone  for blood pressure.

## 2024-08-25 NOTE — Assessment & Plan Note (Signed)
 Due for updated uric acid level.

## 2024-08-25 NOTE — Progress Notes (Signed)
 "  Acute Office Visit  Patient ID: CLAY MENSER, female    DOB: 06-04-1981, 44 y.o.   MRN: [id number]  PCP: Alvan Dorothyann BIRCH, MD  Chief Complaint  Patient presents with   Headache    Subjective:     HPI  Discussed the use of AI scribe software for clinical note transcription with the patient, who gave verbal consent to proceed.  History of Present Illness Brenda Sharp is a 44 year old female with hypertension who presents with sinus pressure and cough.  Sinus pressure and upper respiratory symptoms - Significant sinus pressure localized to the facial cheeks - Mild cough, less severe than previous episodes - Symptoms have persisted between episodes - Nasal drip transitioned from thick to clear - Raspy voice - Facial tenderness on palpation - Scratchy but not painful throat - No ear symptoms - No fever over the weekend  Headache - Headache located at the back of the head, onset approximately 1 to 1.5 weeks ago - Sudden onset, described as a 'shot in the back of the head' with radiation downwards - Intermittent, not constant - Intensity sometimes requires sitting down and applying an ice pack - Pain exacerbated by head movement, leading to avoidance of movement during episodes  Hypertension - Home blood pressure readings typically in the 150s to 160s systolic over 100 diastolic - Currently taking amlodipine  and losartan   Musculoskeletal neck symptoms - History of neck muscle issues, suspected to contribute to headache   ROS     Objective:    BP (!) 140/82   Pulse 85   Ht 5' 3 (1.6 m)   Wt 251 lb 0.6 oz (113.9 kg)   SpO2 100%   BMI 44.47 kg/m    Physical Exam Constitutional:      Appearance: Normal appearance.  HENT:     Head: Normocephalic and atraumatic.     Right Ear: Tympanic membrane, ear canal and external ear normal. There is no impacted cerumen.     Left Ear: Tympanic membrane, ear canal and external ear normal. There is no impacted cerumen.      Nose: Nose normal.     Mouth/Throat:     Pharynx: Oropharynx is clear.  Eyes:     Conjunctiva/sclera: Conjunctivae normal.  Cardiovascular:     Rate and Rhythm: Normal rate and regular rhythm.  Pulmonary:     Effort: Pulmonary effort is normal.     Breath sounds: Normal breath sounds.  Musculoskeletal:     Cervical back: Neck supple. No tenderness.  Lymphadenopathy:     Cervical: No cervical adenopathy.  Skin:    General: Skin is warm and dry.  Neurological:     Mental Status: She is alert and oriented to person, place, and time.  Psychiatric:        Mood and Affect: Mood normal.       No results found for any visits on 08/25/24.     Assessment & Plan:   Problem List Items Addressed This Visit       Cardiovascular and Mediastinum   Essential hypertension, benign   Essential hypertension Home blood pressure readings elevated. Current regimen includes amlodipine  and losartan . Discussed alternative diuretics due to hydrochlorothiazide  intolerance ( gout contraindication) . Emphasized blood pressure control to prevent complications. - Consider adding spironolactone  or chlorthalidone. - Continue home blood pressure monitoring.      Relevant Medications   spironolactone  (ALDACTONE ) 25 MG tablet   Other Relevant Orders   CMP14+EGFR  Endocrine   Diabetes mellitus due to underlying condition with unspecified complications [facility])   Due for updated labs, want to make sure not contributing to her headaches as well.        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       Relevant Orders   Uric acid   Other Visit Diagnoses       Acute non-recurrent maxillary sinusitis    -  Primary   Relevant Medications   doxycycline  (VIBRA -TABS) 100 MG tablet       Assessment and Plan Assessment & Plan Acute bacterial sinusitis Persistent sinus pressure,  nasal discharge, and cough suggest bacterial etiology. No fever or lower respiratory involvement. - Prescribed doxycycline  100 mg once daily for 7 days. - Advised two doses today if possible, doses not needing to be exactly 12 hours apart.   Cervicogenic headache with neck muscle spasm Headache likely musculoskeletal due to neck muscle spasm, not directly related to sinus infection. - Perform neck range of motion exercises. - Monitor headache symptoms post sinus infection treatment and blood pressure management.    Meds ordered this encounter  Medications   doxycycline  (VIBRA -TABS) 100 MG tablet    Sig: Take 1 tablet (100 mg total) by mouth 2 (two) times daily.    Dispense:  14 tablet    Refill:  0   spironolactone  (ALDACTONE ) 25 MG tablet    Sig: Take 1 tablet (25 mg total) by mouth daily.    Dispense:  90 tablet    Refill:  0    Return in about 4 weeks (around 09/22/2024) for Hypertension.  Dorothyann Byars, MD [facility]   "

## 2024-08-25 NOTE — Assessment & Plan Note (Signed)
 Essential hypertension Home blood pressure readings elevated. Current regimen includes amlodipine  and losartan . Discussed alternative diuretics due to hydrochlorothiazide  intolerance ( gout contraindication) . Emphasized blood pressure control to prevent complications. - Consider adding spironolactone  or chlorthalidone. - Continue home blood pressure monitoring.

## 2024-08-25 NOTE — Assessment & Plan Note (Signed)
 Due for updated labs, want to make sure not contributing to her headaches as well.

## 2024-08-29 ENCOUNTER — Other Ambulatory Visit: Payer: Self-pay | Admitting: Family Medicine

## 2024-08-29 DIAGNOSIS — I10 Essential (primary) hypertension: Secondary | ICD-10-CM

## 2024-09-06 ENCOUNTER — Other Ambulatory Visit: Payer: Self-pay | Admitting: Family Medicine

## 2024-09-06 DIAGNOSIS — I10 Essential (primary) hypertension: Secondary | ICD-10-CM

## 2024-09-30 ENCOUNTER — Ambulatory Visit: Admitting: Family Medicine
